# Patient Record
Sex: Male | Born: 1985 | Race: White | Hispanic: No | Marital: Single | Smoking: Current every day smoker
Health system: Southern US, Community
[De-identification: ages and names within clinical notes are randomized; demographics above are authoritative.]

## PROBLEM LIST (undated history)

## (undated) DIAGNOSIS — F32A Depression, unspecified: Secondary | ICD-10-CM

## (undated) DIAGNOSIS — F4310 Post-traumatic stress disorder, unspecified: Secondary | ICD-10-CM

## (undated) DIAGNOSIS — F332 Major depressive disorder, recurrent severe without psychotic features: Secondary | ICD-10-CM

## (undated) DIAGNOSIS — F1910 Other psychoactive substance abuse, uncomplicated: Secondary | ICD-10-CM

## (undated) DIAGNOSIS — F329 Major depressive disorder, single episode, unspecified: Secondary | ICD-10-CM

## (undated) DIAGNOSIS — F419 Anxiety disorder, unspecified: Secondary | ICD-10-CM

## (undated) HISTORY — DX: Major depressive disorder, recurrent severe without psychotic features: F33.2

---

## 2013-10-02 ENCOUNTER — Encounter (HOSPITAL_COMMUNITY): Payer: Self-pay | Admitting: Emergency Medicine

## 2013-10-02 ENCOUNTER — Emergency Department (HOSPITAL_COMMUNITY)
Admission: EM | Admit: 2013-10-02 | Discharge: 2013-10-03 | Disposition: A | Discharge status: Other Acute Inpatient Hospital | Payer: Self-pay | Attending: Emergency Medicine | Admitting: Emergency Medicine

## 2013-10-02 DIAGNOSIS — F1110 Opioid abuse, uncomplicated: Secondary | ICD-10-CM | POA: Insufficient documentation

## 2013-10-02 DIAGNOSIS — F1120 Opioid dependence, uncomplicated: Secondary | ICD-10-CM

## 2013-10-02 DIAGNOSIS — F17200 Nicotine dependence, unspecified, uncomplicated: Secondary | ICD-10-CM | POA: Insufficient documentation

## 2013-10-02 DIAGNOSIS — R45851 Suicidal ideations: Secondary | ICD-10-CM | POA: Insufficient documentation

## 2013-10-02 DIAGNOSIS — Z79899 Other long term (current) drug therapy: Secondary | ICD-10-CM | POA: Insufficient documentation

## 2013-10-02 LAB — COMPREHENSIVE METABOLIC PANEL
ALBUMIN: 4.8 g/dL (ref 3.5–5.2)
ALK PHOS: 65 U/L (ref 39–117)
ALT: 12 U/L (ref 0–53)
AST: 21 U/L (ref 0–37)
Anion gap: 13 (ref 5–15)
BILIRUBIN TOTAL: 0.6 mg/dL (ref 0.3–1.2)
BUN: 12 mg/dL (ref 6–23)
CHLORIDE: 102 meq/L (ref 96–112)
CO2: 26 mEq/L (ref 19–32)
Calcium: 10.2 mg/dL (ref 8.4–10.5)
Creatinine, Ser: 0.98 mg/dL (ref 0.50–1.35)
GFR calc Af Amer: >90 mL/min (ref >90)
GFR calc non Af Amer: >90 mL/min (ref >90)
Glucose, Bld: 90 mg/dL (ref 70–99)
POTASSIUM: 3.8 meq/L (ref 3.7–5.3)
SODIUM: 141 meq/L (ref 137–147)
Total Protein: 7.9 g/dL (ref 6.0–8.3)

## 2013-10-02 LAB — RAPID URINE DRUG SCREEN, HOSP PERFORMED
Amphetamines: NOT DETECTED
BENZODIAZEPINES: NOT DETECTED
Barbiturates: NOT DETECTED
COCAINE: NOT DETECTED
Opiates: NOT DETECTED
TETRAHYDROCANNABINOL: NOT DETECTED

## 2013-10-02 LAB — CBC
HEMATOCRIT: 47.1 % (ref 39.0–52.0)
Hemoglobin: 16.2 g/dL (ref 13.0–17.0)
MCH: 32.2 pg (ref 26.0–34.0)
MCHC: 34.4 g/dL (ref 30.0–36.0)
MCV: 93.6 fL (ref 78.0–100.0)
PLATELETS: 329 10*3/uL (ref 150–400)
RBC: 5.03 MIL/uL (ref 4.22–5.81)
RDW: 11.9 % (ref 11.5–15.5)
WBC: 7.5 10*3/uL (ref 4.0–10.5)

## 2013-10-02 LAB — ETHANOL: Alcohol, Ethyl (B): <11 mg/dL (ref 0–11)

## 2013-10-02 LAB — ACETAMINOPHEN LEVEL

## 2013-10-02 LAB — SALICYLATE LEVEL: Salicylate Lvl: <2 mg/dL — ABNORMAL LOW (ref 2.8–20.0)

## 2013-10-02 MED ORDER — ALUM & MAG HYDROXIDE-SIMETH 200-200-20 MG/5ML PO SUSP: 30.0000 mL | ORAL | Status: DC | PRN

## 2013-10-02 MED ORDER — HYDROXYZINE HCL 25 MG PO TABS
25.0000 mg | ORAL_TABLET | Freq: Four times a day (QID) | ORAL | Status: DC | PRN
  Administered 2013-10-03: 25 mg via ORAL
  Filled 2013-10-02: qty 1

## 2013-10-02 MED ORDER — LORAZEPAM 1 MG PO TABS
1.0000 mg | ORAL_TABLET | Freq: Three times a day (TID) | ORAL | Status: DC | PRN
  Administered 2013-10-02 (×2): 1 mg via ORAL
  Filled 2013-10-02 (×2): qty 1

## 2013-10-02 MED ORDER — DICYCLOMINE HCL 20 MG PO TABS
20.0000 mg | ORAL_TABLET | Freq: Four times a day (QID) | ORAL | Status: DC | PRN
  Administered 2013-10-02: 20 mg via ORAL
  Filled 2013-10-02: qty 1

## 2013-10-02 MED ORDER — NAPROXEN 500 MG PO TABS
500.0000 mg | ORAL_TABLET | Freq: Two times a day (BID) | ORAL | Status: DC | PRN
  Administered 2013-10-03: 500 mg via ORAL
  Filled 2013-10-02: qty 1

## 2013-10-02 MED ORDER — CLONIDINE HCL 0.1 MG PO TABS
0.1000 mg | ORAL_TABLET | Freq: Four times a day (QID) | ORAL | Status: DC
  Administered 2013-10-02 – 2013-10-03 (×2): 0.1 mg via ORAL
  Filled 2013-10-02 (×2): qty 1

## 2013-10-02 MED ORDER — LOPERAMIDE HCL 2 MG PO CAPS: 2.0000 mg | ORAL_CAPSULE | ORAL | Status: DC | PRN

## 2013-10-02 MED ORDER — TRAZODONE HCL 50 MG PO TABS
50.0000 mg | ORAL_TABLET | Freq: Every evening | ORAL | Status: DC | PRN
  Administered 2013-10-02 (×2): 50 mg via ORAL
  Filled 2013-10-02 (×2): qty 1

## 2013-10-02 MED ORDER — CLONIDINE HCL 0.1 MG PO TABS: 0.1000 mg | ORAL_TABLET | Freq: Every day | ORAL | Status: DC

## 2013-10-02 MED ORDER — ONDANSETRON 4 MG PO TBDP: 4.0000 mg | ORAL_TABLET | Freq: Four times a day (QID) | ORAL | Status: DC | PRN

## 2013-10-02 MED ORDER — ZOLPIDEM TARTRATE 5 MG PO TABS: 5.0000 mg | ORAL_TABLET | Freq: Every evening | ORAL | Status: DC | PRN

## 2013-10-02 MED ORDER — NICOTINE 21 MG/24HR TD PT24: 21.0000 mg | MEDICATED_PATCH | Freq: Every day | TRANSDERMAL | Status: DC

## 2013-10-02 MED ORDER — CHLORDIAZEPOXIDE HCL 25 MG PO CAPS
25.0000 mg | ORAL_CAPSULE | Freq: Four times a day (QID) | ORAL | Status: DC | PRN
  Administered 2013-10-02 – 2013-10-03 (×3): 25 mg via ORAL
  Filled 2013-10-02 (×3): qty 1

## 2013-10-02 MED ORDER — METHOCARBAMOL 500 MG PO TABS
500.0000 mg | ORAL_TABLET | Freq: Three times a day (TID) | ORAL | Status: DC | PRN
  Administered 2013-10-03: 500 mg via ORAL
  Filled 2013-10-02: qty 1

## 2013-10-02 MED ORDER — CLONIDINE HCL 0.1 MG PO TABS: 0.1000 mg | ORAL_TABLET | ORAL | Status: DC

## 2013-10-02 MED ORDER — ACETAMINOPHEN 325 MG PO TABS: 650.0000 mg | ORAL_TABLET | ORAL | Status: DC | PRN

## 2013-10-02 MED ORDER — IBUPROFEN 200 MG PO TABS
600.0000 mg | ORAL_TABLET | Freq: Three times a day (TID) | ORAL | Status: DC | PRN
  Administered 2013-10-02: 600 mg via ORAL
  Filled 2013-10-02: qty 3

## 2013-10-02 MED ORDER — ONDANSETRON HCL 4 MG PO TABS: 4.0000 mg | ORAL_TABLET | Freq: Three times a day (TID) | ORAL | Status: DC | PRN

## 2013-10-02 NOTE — ED Notes (Signed)
Patient also complains of abdominal cramps but denies diarrhea. Patient will be medicated per mar. Encouragement and support provided and safety maintain.
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
## 2013-10-02 NOTE — ED Notes (Signed)
Pt here requesting detox from opioids and states he is having thoughts of suicide, no plan but has attempted in the past.

## 2013-10-02 NOTE — ED Notes (Signed)
Pt. Has two black suit cases, a black book bag and a close basket with clothes. He also has one belongings bag. All of patients belongings are at the nurses station.

## 2013-10-02 NOTE — ED Notes (Signed)
Donell SievertSpencer Simon, PA contacted and informed of patient CIWA score of 12. Spencer Pa informed that patient has shakes and tremors and his skin was flushed and red in color. Respirations equal an unlabored, skin moist clammy. New Orders received and will continue to monitor patient.
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
## 2013-10-03 ENCOUNTER — Inpatient Hospital Stay (HOSPITAL_COMMUNITY)
Admission: AD | Admit: 2013-10-03 | Discharge: 2013-10-15 | DRG: 897 | Disposition: A | Discharge status: Home / Self Care | Payer: Federal, State, Local not specified - Other | Source: Intra-hospital | Attending: Psychiatry | Admitting: Psychiatry

## 2013-10-03 ENCOUNTER — Encounter (HOSPITAL_COMMUNITY): Payer: Self-pay | Admitting: *Deleted

## 2013-10-03 DIAGNOSIS — K5900 Constipation, unspecified: Secondary | ICD-10-CM | POA: Diagnosis present

## 2013-10-03 DIAGNOSIS — R45851 Suicidal ideations: Secondary | ICD-10-CM

## 2013-10-03 DIAGNOSIS — F411 Generalized anxiety disorder: Secondary | ICD-10-CM | POA: Diagnosis present

## 2013-10-03 DIAGNOSIS — G4700 Insomnia, unspecified: Secondary | ICD-10-CM | POA: Diagnosis present

## 2013-10-03 DIAGNOSIS — F909 Attention-deficit hyperactivity disorder, unspecified type: Secondary | ICD-10-CM | POA: Diagnosis present

## 2013-10-03 DIAGNOSIS — F4310 Post-traumatic stress disorder, unspecified: Secondary | ICD-10-CM | POA: Diagnosis present

## 2013-10-03 DIAGNOSIS — F410 Panic disorder [episodic paroxysmal anxiety] without agoraphobia: Secondary | ICD-10-CM | POA: Diagnosis present

## 2013-10-03 DIAGNOSIS — F339 Major depressive disorder, recurrent, unspecified: Secondary | ICD-10-CM | POA: Diagnosis present

## 2013-10-03 DIAGNOSIS — F22 Delusional disorders: Secondary | ICD-10-CM | POA: Diagnosis present

## 2013-10-03 DIAGNOSIS — F331 Major depressive disorder, recurrent, moderate: Secondary | ICD-10-CM

## 2013-10-03 DIAGNOSIS — F1193 Opioid use, unspecified with withdrawal: Secondary | ICD-10-CM | POA: Diagnosis present

## 2013-10-03 DIAGNOSIS — F1120 Opioid dependence, uncomplicated: Secondary | ICD-10-CM | POA: Diagnosis present

## 2013-10-03 DIAGNOSIS — F11282 Opioid dependence with opioid-induced sleep disorder: Secondary | ICD-10-CM

## 2013-10-03 MED ORDER — METHOCARBAMOL 500 MG PO TABS
500.0000 mg | ORAL_TABLET | Freq: Three times a day (TID) | ORAL | Status: AC | PRN
  Administered 2013-10-03 – 2013-10-08 (×7): 500 mg via ORAL
  Filled 2013-10-03 (×8): qty 1

## 2013-10-03 MED ORDER — HYDROXYZINE HCL 25 MG PO TABS
25.0000 mg | ORAL_TABLET | Freq: Four times a day (QID) | ORAL | Status: DC | PRN
  Administered 2013-10-03 – 2013-10-05 (×3): 25 mg via ORAL
  Filled 2013-10-03 (×3): qty 1

## 2013-10-03 MED ORDER — ALUM & MAG HYDROXIDE-SIMETH 200-200-20 MG/5ML PO SUSP: 30.0000 mL | ORAL | Status: DC | PRN

## 2013-10-03 MED ORDER — NICOTINE 21 MG/24HR TD PT24
21.0000 mg | MEDICATED_PATCH | Freq: Every day | TRANSDERMAL | Status: DC
  Filled 2013-10-03 (×3): qty 1

## 2013-10-03 MED ORDER — MAGNESIUM HYDROXIDE 400 MG/5ML PO SUSP: 30.0000 mL | Freq: Every day | ORAL | Status: DC | PRN

## 2013-10-03 MED ORDER — TRAZODONE HCL 50 MG PO TABS
50.0000 mg | ORAL_TABLET | Freq: Every evening | ORAL | Status: DC | PRN
  Administered 2013-10-04 – 2013-10-08 (×6): 50 mg via ORAL
  Filled 2013-10-03 (×15): qty 1

## 2013-10-03 MED ORDER — CLONIDINE HCL 0.1 MG PO TABS: 0.1000 mg | ORAL_TABLET | Freq: Every day | ORAL | Status: DC

## 2013-10-03 MED ORDER — NAPROXEN 500 MG PO TABS
500.0000 mg | ORAL_TABLET | Freq: Two times a day (BID) | ORAL | Status: AC | PRN
  Administered 2013-10-05 – 2013-10-08 (×6): 500 mg via ORAL
  Filled 2013-10-03 (×7): qty 1

## 2013-10-03 MED ORDER — ALUM & MAG HYDROXIDE-SIMETH 200-200-20 MG/5ML PO SUSP
30.0000 mL | ORAL | Status: DC | PRN
  Administered 2013-10-12: 30 mL via ORAL

## 2013-10-03 MED ORDER — ACETAMINOPHEN 325 MG PO TABS
650.0000 mg | ORAL_TABLET | Freq: Four times a day (QID) | ORAL | Status: DC | PRN
  Administered 2013-10-08 – 2013-10-10 (×2): 650 mg via ORAL
  Filled 2013-10-03 (×3): qty 2

## 2013-10-03 MED ORDER — DICYCLOMINE HCL 20 MG PO TABS
20.0000 mg | ORAL_TABLET | Freq: Four times a day (QID) | ORAL | Status: AC | PRN
  Administered 2013-10-03 – 2013-10-08 (×5): 20 mg via ORAL
  Filled 2013-10-03 (×6): qty 1

## 2013-10-03 MED ORDER — CLONIDINE HCL 0.1 MG PO TABS
0.1000 mg | ORAL_TABLET | Freq: Four times a day (QID) | ORAL | Status: DC
  Administered 2013-10-03: 0.1 mg via ORAL
  Filled 2013-10-03 (×8): qty 1

## 2013-10-03 MED ORDER — ONDANSETRON 4 MG PO TBDP
4.0000 mg | ORAL_TABLET | Freq: Four times a day (QID) | ORAL | Status: AC | PRN
  Filled 2013-10-03: qty 1

## 2013-10-03 MED ORDER — CLONIDINE HCL 0.1 MG PO TABS: 0.1000 mg | ORAL_TABLET | ORAL | Status: DC

## 2013-10-03 MED ORDER — LOPERAMIDE HCL 2 MG PO CAPS: 2.0000 mg | ORAL_CAPSULE | ORAL | Status: AC | PRN

## 2013-10-03 NOTE — ED Notes (Signed)
Patient complain of muscle cramps and shakes. Patient will be medicated per MAR. No acute distress noted.

## 2013-10-03 NOTE — Progress Notes (Signed)
Patient's diastolic pressure and pulse was very low. Writer gave 2 cups of Gatorade and recheck the v/s but it remained low. Writer notified PA OakwoodSpencer. He encouraged staff to force fluid and hold all medications. Writer encouraged patient and offered more fluid and snacks. Patient receptive to encouragement and support.

## 2013-10-03 NOTE — Plan of Care (Signed)
[facility] Observation Crisis Plan  Reason for Crisis Plan:  Substance Abuse   Plan of Care:  Referral for Substance Abuse  Family Support:      Current Living Environment:  Living Arrangements: Alone  Insurance:   Hospital Account   Name Acct ID Class Status Primary Coverage   Mason Jones, Mason Jones [id number] BEHAVIORAL HEALTH OBSERVATION Open None        Guarantor Account (for Hospital Account [id number])   Name Relation to Pt Service Area Active? Acct Type   Mason Jones, Mason Jones Self [facility] Yes Behavioral Health   Address Phone       [address] [place], [place] [address] [phone](H)          Coverage Information (for Hospital Account [id number])   Not on file      Legal Guardian:     Primary Care Provider:  No primary provider on file.  Current Outpatient Providers:  [facility]  Psychiatrist:     Counselor/Therapist:     Compliant with Medications:  No  Additional Information:   Mason Jones, Mason Jones 7/8/20153:46 PM

## 2013-10-03 NOTE — Progress Notes (Signed)
Patient in bed sleeping for the most part of the evening. Writer woke patient up and offered him Gatorade and snack. Patient endorsed having tremors, sweats and body aches. Writer encouraged and supported patient. Patient receptive to encouragement and support. Q 15 minute check continues as ordered to maintain safety.

## 2013-10-03 NOTE — Progress Notes (Addendum)
Patient ID: Mason HeirJames Jones, male   DOB: 11/06/1985, 28 y.o.   MRN: [id number] Patient admitted to obs unit.  Irritable, depressed . Affect sullen. Poor eye contact. COWS 10 .Oriented to unit. Nutrition offered. Education provided regarding safety and falls. Safety checks started every 15 minutes.

## 2013-10-03 NOTE — ED Provider Notes (Signed)
Medical screening examination/treatment/procedure(s) were performed by non-physician practitioner and as supervising physician I was immediately available for consultation/collaboration.   EKG Interpretation None        Richardean Canalavid H Amira Podolak, MD 10/03/13 2123

## 2013-10-03 NOTE — Consult Note (Signed)
  Mr Mason Jones is requesting detox from opiates even though it has been 4 days since his last use.he is saying he is frustrated and suicidal.  He was in a methadone clinic but was still using so he was discharged.  [facility] but does not want to follow there. [facility] also [facility] but apparently left there because he did not like it.  [place] then [place] this [place] homeless and seeking long term rehab.  He has some suicidal thoughts saying "it's a never ending cycle of fuck- ups"  [facility]

## 2013-10-03 NOTE — Progress Notes (Signed)
[facility] INPATIENT:  Family/Significant Other Suicide Prevention Education  Suicide Prevention Education:  Patient Refusal for Family/Significant Other Suicide Prevention Education: The patient Mason Jones has refused to provide written consent for family/significant other to be provided Family/Significant Other Suicide Prevention Education during admission and/or prior to discharge.  Physician notified.  Loren RacerMaggio, [address] 10/03/2013, 3:49 PM

## 2013-10-04 DIAGNOSIS — F4310 Post-traumatic stress disorder, unspecified: Secondary | ICD-10-CM | POA: Diagnosis present

## 2013-10-04 DIAGNOSIS — F1120 Opioid dependence, uncomplicated: Secondary | ICD-10-CM | POA: Diagnosis present

## 2013-10-04 DIAGNOSIS — F410 Panic disorder [episodic paroxysmal anxiety] without agoraphobia: Secondary | ICD-10-CM | POA: Diagnosis present

## 2013-10-04 DIAGNOSIS — F22 Delusional disorders: Secondary | ICD-10-CM | POA: Diagnosis present

## 2013-10-04 DIAGNOSIS — F909 Attention-deficit hyperactivity disorder, unspecified type: Secondary | ICD-10-CM | POA: Diagnosis present

## 2013-10-04 DIAGNOSIS — K5900 Constipation, unspecified: Secondary | ICD-10-CM | POA: Diagnosis present

## 2013-10-04 DIAGNOSIS — G4700 Insomnia, unspecified: Secondary | ICD-10-CM | POA: Diagnosis present

## 2013-10-04 DIAGNOSIS — R45851 Suicidal ideations: Secondary | ICD-10-CM | POA: Diagnosis not present

## 2013-10-04 DIAGNOSIS — F411 Generalized anxiety disorder: Secondary | ICD-10-CM | POA: Diagnosis present

## 2013-10-04 DIAGNOSIS — F339 Major depressive disorder, recurrent, unspecified: Secondary | ICD-10-CM | POA: Diagnosis present

## 2013-10-04 MED ORDER — METHOCARBAMOL 500 MG PO TABS: 500.0000 mg | ORAL_TABLET | Freq: Three times a day (TID) | ORAL | Status: DC | PRN

## 2013-10-04 MED ORDER — CLONIDINE HCL 0.1 MG PO TABS
0.1000 mg | ORAL_TABLET | Freq: Four times a day (QID) | ORAL | Status: DC
Start: 2013-10-04 — End: 2013-10-04
  Filled 2013-10-04 (×3): qty 1

## 2013-10-04 MED ORDER — NICOTINE 21 MG/24HR TD PT24
21.0000 mg | MEDICATED_PATCH | Freq: Every day | TRANSDERMAL | Status: DC
  Administered 2013-10-04: 21 mg via TRANSDERMAL
  Filled 2013-10-04 (×3): qty 1

## 2013-10-04 MED ORDER — CLONIDINE HCL 0.1 MG PO TABS
0.1000 mg | ORAL_TABLET | ORAL | Status: AC
  Administered 2013-10-06 – 2013-10-08 (×4): 0.1 mg via ORAL
  Filled 2013-10-04 (×4): qty 1

## 2013-10-04 MED ORDER — CLONIDINE HCL 0.1 MG PO TABS: 0.1000 mg | ORAL_TABLET | ORAL | Status: DC

## 2013-10-04 MED ORDER — LOPERAMIDE HCL 2 MG PO CAPS: 2.0000 mg | ORAL_CAPSULE | ORAL | Status: DC | PRN

## 2013-10-04 MED ORDER — CLONIDINE HCL 0.1 MG PO TABS
0.1000 mg | ORAL_TABLET | Freq: Four times a day (QID) | ORAL | Status: AC
  Administered 2013-10-04 – 2013-10-06 (×7): 0.1 mg via ORAL
  Filled 2013-10-04 (×9): qty 1

## 2013-10-04 MED ORDER — CLONIDINE HCL 0.1 MG PO TABS: 0.1000 mg | ORAL_TABLET | Freq: Every day | ORAL | Status: DC

## 2013-10-04 MED ORDER — HYDROXYZINE HCL 25 MG PO TABS
25.0000 mg | ORAL_TABLET | Freq: Four times a day (QID) | ORAL | Status: DC | PRN
  Administered 2013-10-04: 25 mg via ORAL
  Filled 2013-10-04: qty 1

## 2013-10-04 MED ORDER — ONDANSETRON 4 MG PO TBDP
4.0000 mg | ORAL_TABLET | Freq: Four times a day (QID) | ORAL | Status: DC | PRN
  Administered 2013-10-04: 4 mg via ORAL

## 2013-10-04 MED ORDER — NAPROXEN 500 MG PO TABS
500.0000 mg | ORAL_TABLET | Freq: Two times a day (BID) | ORAL | Status: DC | PRN
  Administered 2013-10-04: 500 mg via ORAL

## 2013-10-04 MED ORDER — LORAZEPAM 1 MG PO TABS
1.0000 mg | ORAL_TABLET | Freq: Four times a day (QID) | ORAL | Status: DC | PRN
  Administered 2013-10-04 – 2013-10-10 (×17): 1 mg via ORAL
  Filled 2013-10-04 (×18): qty 1

## 2013-10-04 MED ORDER — TRAZODONE HCL 50 MG PO TABS: 50.0000 mg | ORAL_TABLET | Freq: Every evening | ORAL | Status: DC | PRN

## 2013-10-04 MED ORDER — CLONIDINE HCL 0.1 MG PO TABS
0.1000 mg | ORAL_TABLET | Freq: Every day | ORAL | Status: AC
  Administered 2013-10-09 – 2013-10-10 (×2): 0.1 mg via ORAL
  Filled 2013-10-04 (×2): qty 1

## 2013-10-04 MED ORDER — METHOCARBAMOL 500 MG PO TABS
500.0000 mg | ORAL_TABLET | Freq: Three times a day (TID) | ORAL | Status: DC | PRN
  Administered 2013-10-04: 500 mg via ORAL

## 2013-10-04 MED ORDER — DICYCLOMINE HCL 20 MG PO TABS
20.0000 mg | ORAL_TABLET | Freq: Four times a day (QID) | ORAL | Status: DC | PRN
  Administered 2013-10-04: 20 mg via ORAL

## 2013-10-04 NOTE — Progress Notes (Signed)
Pt transferred from obs unit to adult unit, pt requesting detox from opiates. Pt spoke about wanting to go to long-term treatment facility after discharge. Pt denies SI at present time, pt was oriented to the unit and safety maintained.

## 2013-10-04 NOTE — Progress Notes (Addendum)
Patient ID: Mason Jones, male   DOB: 04/13/1985, 28 y.o.   MRN: [id number] Patient sleeping all am. Clondine with held due to low diastolic BP this AM. NP notified. Continue to monitor for safety.

## 2013-10-04 NOTE — Tx Team (Signed)
Initial Interdisciplinary Treatment Plan  PATIENT STRENGTHS: (choose at least two) Ability for insight Average or above average intelligence General fund of knowledge Motivation for treatment/growth  PATIENT STRESSORS: Substance abuse   PROBLEM LIST: Problem List/Patient Goals Date to be addressed Date deferred Reason deferred Estimated date of resolution  Opiate abuse 10/04/13                                                      DISCHARGE CRITERIA:  Ability to meet basic life and health needs Improved stabilization in mood, thinking, and/or behavior Verbal commitment to aftercare and medication compliance Withdrawal symptoms are absent or subacute and managed without 24-hour nursing intervention  PRELIMINARY DISCHARGE PLAN: Attend aftercare/continuing care group Placement in alternative living arrangements  PATIENT/FAMIILY INVOLVEMENT: This treatment plan has been presented to and reviewed with the patient, Mason Jones, and/or family member, .  The patient and family have been given the opportunity to ask questions and make suggestions.  Charma Mocarski, [place] 10/04/2013, 7:54 PM

## 2013-10-04 NOTE — Plan of Care (Addendum)
[facility] Observation Crisis Plan  Reason for Crisis Plan:  Crisis Stabilization   Plan of Care:  Referral for Inpatient Hospitalization  Family Support:    None  Current Living Environment:  Living Arrangements: Alone; Pt has been living in a halfway house.  He must go through treatment before he is allowed to return.  Insurance:  Self Pay Hospital Account   Name Acct ID Class Status Primary Coverage   Urbano HeirRice, Tyshan [id number] BEHAVIORAL HEALTH OBSERVATION Open None        Guarantor Account (for Hospital Account [id number])   Name Relation to Pt Service Area Active? Acct Type   Cephas Darbyice, Colman Self [facility] Yes Behavioral Health   Address Phone       [address] [place], [place] [address] [phone](H)          Coverage Information (for Hospital Account [id number])   Not on file      Legal Guardian:   Self  Primary Care Provider:  No primary provider on file. None  Current Outpatient Providers:  None  Psychiatrist:   None  Counselor/Therapist:   None  Compliant with Medications:  Yes  Additional Information: After consulting with Delorise Royalsorey Burkett, NP it has been determined that pt would benefit from psychiatric hospitalization.  He is to be admitted to the 300 hall to the service of Geoffery LyonsIrving Lugo, MD.  Per Berneice Heinrichina Tate, RN, [facility], no bed is available currently, but openings are anticipated.  Pt signed Voluntary Admission and Consent for Treatment.  He also signed Consent for Release of Information, but has no current providers.  Doylene Canninghomas Cherri Yera, MA Triage Specialist Kizzie BaneHughes, Harriette Bouillonhomas Patrick 7/9/201511:45 AM

## 2013-10-04 NOTE — Progress Notes (Signed)
[facility] MD Progress Note  10/04/2013 2:27 PM Mason Jones  MRN:  [id number] Subjective:  28 year old male who presented to [facility] for SI and requesting detox from opioids.  He has used opioids for several years and according to patient he has underwent twice.  Last use of Opioids- Roxicodone was 5 days ago.  He endorses SI upon assessment with Bernarda Caffey, PA-C but reported at that time that he did not have a plan.  He reports to Probation officer during assessment that he does have multiple plans.  Plans suicide by shooting himself with a gun, jumping off of a bridge, or running in front of a car.  He denies HI or AVH.  He states that he is just tired if life,  He continues, "I have lost everything to come here".  "I was trying to do good and I lost my job and my apartment".  Tremors noted during assessment, he is also agitated and anxious during assessment.    Diagnosis: suicidal ideation; opioid dependence Total Time spent with patient: 15 minutes  ADL's:  Intact  Sleep: Fair  Appetite:  Fair  Suicidal Ideation:  Plan:  to shoot himself, to jump off of a bridge, or running in front of a car Homicidal Ideation:  denies  Psychiatric Specialty Exam: Physical Exam  ROS  Blood pressure 110/78, pulse 66, temperature 98.3 F (36.8 C), temperature source Oral, resp. rate 16, height 5' 10"  (1.778 m), weight 62.37 kg (137 lb 8 oz), SpO2 100.00%.Body mass index is 19.73 kg/(m^2).  General Appearance: Fairly Groomed  Engineer, water::  Fair  Speech:  Blocked and Normal Rate  Volume:  Normal  Mood:  Anxious  Affect:  Flat  Thought Process:  Coherent  Orientation:  Full (Time, Place, and Person)  Thought Content:  Negative  Suicidal Thoughts:  Yes.  with intent/plan  Homicidal Thoughts:  No  Memory:  Immediate;   Fair Recent;   Fair  Judgement:  Impaired  Insight:  Lacking  Psychomotor Activity:  Negative  Concentration:  Fair  Recall:  [employer] of Knowledge:Fair  Language: Fair  Akathisia:  Negative   Handed:    AIMS (if indicated):     Assets:  Communication Skills Desire for Improvement  Sleep:       Current Medications: Current Facility-Administered Medications  Medication Dose Route Frequency Provider Last Rate Last Dose  . acetaminophen (TYLENOL) tablet 650 mg  650 mg Oral Q6H PRN Clarene Reamer, MD      . alum & mag hydroxide-simeth (MAALOX/MYLANTA) 200-200-20 MG/5ML suspension 30 mL  30 mL Oral PRN Clarene Reamer, MD      . alum & mag hydroxide-simeth (MAALOX/MYLANTA) 200-200-20 MG/5ML suspension 30 mL  30 mL Oral Q4H PRN Clarene Reamer, MD      . cloNIDine (CATAPRES) tablet 0.1 mg  0.1 mg Oral QID Clarene Reamer, MD   0.1 mg at 10/03/13 1733   Followed by  . [START ON 10/06/2013] cloNIDine (CATAPRES) tablet 0.1 mg  0.1 mg Oral BH-qamhs Clarene Reamer, MD       Followed by  . [START ON 10/08/2013] cloNIDine (CATAPRES) tablet 0.1 mg  0.1 mg Oral QAC breakfast Clarene Reamer, MD      . dicyclomine (BENTYL) tablet 20 mg  20 mg Oral Q6H PRN Clarene Reamer, MD   20 mg at 10/03/13 1734  . hydrOXYzine (ATARAX/VISTARIL) tablet 25 mg  25 mg Oral Q6H PRN Clarene Reamer, MD  25 mg at 10/03/13 2229  . loperamide (IMODIUM) capsule 2-4 mg  2-4 mg Oral PRN Clarene Reamer, MD      . magnesium hydroxide (MILK OF MAGNESIA) suspension 30 mL  30 mL Oral Daily PRN Clarene Reamer, MD      . methocarbamol (ROBAXIN) tablet 500 mg  500 mg Oral Q8H PRN Clarene Reamer, MD   500 mg at 10/03/13 1733  . naproxen (NAPROSYN) tablet 500 mg  500 mg Oral BID PRN Clarene Reamer, MD      . nicotine (NICODERM CQ - dosed in mg/24 hours) patch 21 mg  21 mg Transdermal Daily Clarene Reamer, MD      . ondansetron (ZOFRAN-ODT) disintegrating tablet 4 mg  4 mg Oral Q6H PRN Clarene Reamer, MD      . traZODone (DESYREL) tablet 50 mg  50 mg Oral QHS,MR X 1 Clarene Reamer, MD        Lab Results:  Results for orders placed during the hospital encounter of 10/02/13 (from the past 48 hour(s))  URINE RAPID DRUG  SCREEN (HOSP PERFORMED)     Status: None   Collection Time    10/02/13  2:34 PM      Result Value Ref Range   Opiates NONE DETECTED  NONE DETECTED   Cocaine NONE DETECTED  NONE DETECTED   Benzodiazepines NONE DETECTED  NONE DETECTED   Amphetamines NONE DETECTED  NONE DETECTED   Tetrahydrocannabinol NONE DETECTED  NONE DETECTED   Barbiturates NONE DETECTED  NONE DETECTED   Comment:            DRUG SCREEN FOR MEDICAL PURPOSES     ONLY.  IF CONFIRMATION IS NEEDED     FOR ANY PURPOSE, NOTIFY LAB     WITHIN 5 DAYS.                LOWEST DETECTABLE LIMITS     FOR URINE DRUG SCREEN     Drug Class       Cutoff (ng/mL)     Amphetamine      1000     Barbiturate      200     Benzodiazepine   768     Tricyclics       088     Opiates          300     Cocaine          300     THC              50  ACETAMINOPHEN LEVEL     Status: None   Collection Time    10/02/13  2:52 PM      Result Value Ref Range   Acetaminophen (Tylenol), Serum <15.0  10 - 30 ug/mL   Comment:            THERAPEUTIC CONCENTRATIONS VARY     SIGNIFICANTLY. A RANGE OF 10-30     ug/mL MAY BE AN EFFECTIVE     CONCENTRATION FOR MANY PATIENTS.     HOWEVER, SOME ARE BEST TREATED     AT CONCENTRATIONS OUTSIDE THIS     RANGE.     ACETAMINOPHEN CONCENTRATIONS     >150 ug/mL AT 4 HOURS AFTER     INGESTION AND >50 ug/mL AT 12     HOURS AFTER INGESTION ARE     OFTEN ASSOCIATED WITH TOXIC     REACTIONS.  CBC  Status: None   Collection Time    10/02/13  2:52 PM      Result Value Ref Range   WBC 7.5  4.0 - 10.5 K/uL   RBC 5.03  4.22 - 5.81 MIL/uL   Hemoglobin 16.2  13.0 - 17.0 g/dL   HCT 47.1  39.0 - 52.0 %   MCV 93.6  78.0 - 100.0 fL   MCH 32.2  26.0 - 34.0 pg   MCHC 34.4  30.0 - 36.0 g/dL   RDW 11.9  11.5 - 15.5 %   Platelets 329  150 - 400 K/uL  COMPREHENSIVE METABOLIC PANEL     Status: None   Collection Time    10/02/13  2:52 PM      Result Value Ref Range   Sodium 141  137 - 147 mEq/L   Potassium 3.8  3.7 -  5.3 mEq/L   Chloride 102  96 - 112 mEq/L   CO2 26  19 - 32 mEq/L   Glucose, Bld 90  70 - 99 mg/dL   BUN 12  6 - 23 mg/dL   Creatinine, Ser 0.98  0.50 - 1.35 mg/dL   Calcium 10.2  8.4 - 10.5 mg/dL   Total Protein 7.9  6.0 - 8.3 g/dL   Albumin 4.8  3.5 - 5.2 g/dL   AST 21  0 - 37 U/L   ALT 12  0 - 53 U/L   Alkaline Phosphatase 65  39 - 117 U/L   Total Bilirubin 0.6  0.3 - 1.2 mg/dL   GFR calc non Af Amer >90  >90 mL/min   GFR calc Af Amer >90  >90 mL/min   Comment: (NOTE)     The eGFR has been calculated using the CKD EPI equation.     This calculation has not been validated in all clinical situations.     eGFR's persistently <90 mL/min signify possible Chronic Kidney     Disease.   Anion gap 13  5 - 15  ETHANOL     Status: None   Collection Time    10/02/13  2:52 PM      Result Value Ref Range   Alcohol, Ethyl (B) <11  0 - 11 mg/dL   Comment:            LOWEST DETECTABLE LIMIT FOR     SERUM ALCOHOL IS 11 mg/dL     FOR MEDICAL PURPOSES ONLY  SALICYLATE LEVEL     Status: Abnormal   Collection Time    10/02/13  2:52 PM      Result Value Ref Range   Salicylate Lvl <0.3 (*) 2.8 - 20.0 mg/dL    Physical Findings: AIMS:  , ,  ,  ,    CIWA:    COWS:  COWS Total Score: 10  Treatment Plan Summary: Daily contact with patient to assess and evaluate symptoms and progress in treatment Medication management  Plan: 1. Admit inpatient to 300 hall.  2. Crisis management and stabilization.  2. Medication management:  4.. Encourage patient to attend groups and participate in group counseling sessions and activities.  6. Address health issues: Vitals reviewed and stable; order for [facility] pm draw for CMP and CBC.   I certify that inpatient services furnished can reasonably be expected to improve the patient's condition.   Gypsy Lore CORI 10/04/2013, 2:27 PM

## 2013-10-04 NOTE — Progress Notes (Signed)
Patient ID: Mason Jones, male   DOB: 07/02/1985, 28 y.o.   MRN: [id number] Patient refused 12 pm dose of Clonidine stating "It doesn't help, I want Ativan". Blood pressure improved to 110/78. Patient to be admitted to Adult 300 hall when bed is available.

## 2013-10-04 NOTE — Progress Notes (Signed)
Patient ID: Mason Jones, male   DOB: 02/29/1986, 28 y.o.   MRN: [id number] Patient transferred to Adult 300.  Taken to unit by Shon BatonBrooks, Charity fundraiserN.

## 2013-10-04 NOTE — Progress Notes (Signed)
Patient did not attend the evening karaoke group. Pt did walk down to the cafeteria but returned shortly after asking for his nurse. Pt requested medication and had questions about his medications.

## 2013-10-05 MED ORDER — VENLAFAXINE HCL ER 37.5 MG PO CP24
37.5000 mg | ORAL_CAPSULE | Freq: Every day | ORAL | Status: DC
  Administered 2013-10-06 – 2013-10-08 (×3): 37.5 mg via ORAL
  Filled 2013-10-05 (×4): qty 1

## 2013-10-05 MED ORDER — LORAZEPAM 1 MG PO TABS
1.0000 mg | ORAL_TABLET | Freq: Once | ORAL | Status: AC
  Administered 2013-10-05: 1 mg via ORAL

## 2013-10-05 MED ORDER — ATOMOXETINE HCL 40 MG PO CAPS
80.0000 mg | ORAL_CAPSULE | Freq: Every day | ORAL | Status: DC
  Administered 2013-10-05: 80 mg via ORAL
  Filled 2013-10-05 (×4): qty 2

## 2013-10-05 MED ORDER — NICOTINE POLACRILEX 2 MG MT GUM
CHEWING_GUM | OROMUCOSAL | Status: AC
  Administered 2013-10-05: 2 mg
  Filled 2013-10-05: qty 1

## 2013-10-05 MED ORDER — QUETIAPINE FUMARATE 50 MG PO TABS
50.0000 mg | ORAL_TABLET | Freq: Three times a day (TID) | ORAL | Status: DC
  Administered 2013-10-05: 50 mg via ORAL
  Filled 2013-10-05 (×7): qty 1

## 2013-10-05 MED ORDER — OLANZAPINE 5 MG PO TABS
5.0000 mg | ORAL_TABLET | Freq: Every day | ORAL | Status: DC
  Administered 2013-10-05 – 2013-10-10 (×6): 5 mg via ORAL
  Filled 2013-10-05 (×6): qty 1
  Filled 2013-10-05: qty 2
  Filled 2013-10-05: qty 1

## 2013-10-05 MED ORDER — LORAZEPAM 1 MG PO TABS
ORAL_TABLET | ORAL | Status: AC
  Filled 2013-10-05: qty 1

## 2013-10-05 MED ORDER — QUETIAPINE FUMARATE 100 MG PO TABS
100.0000 mg | ORAL_TABLET | Freq: Every day | ORAL | Status: DC
  Filled 2013-10-05 (×2): qty 1

## 2013-10-05 MED ORDER — ENSURE COMPLETE PO LIQD
237.0000 mL | Freq: Three times a day (TID) | ORAL | Status: DC
  Administered 2013-10-05 – 2013-10-15 (×27): 237 mL via ORAL

## 2013-10-05 MED ORDER — NICOTINE POLACRILEX 2 MG MT GUM
2.0000 mg | CHEWING_GUM | OROMUCOSAL | Status: DC | PRN
  Administered 2013-10-05: 2 mg via ORAL
  Filled 2013-10-05 (×2): qty 1

## 2013-10-05 MED ORDER — FLUOXETINE HCL 20 MG PO CAPS
60.0000 mg | ORAL_CAPSULE | Freq: Every day | ORAL | Status: DC
  Administered 2013-10-05: 60 mg via ORAL
  Filled 2013-10-05 (×4): qty 3

## 2013-10-05 NOTE — [facility] Group Notes (Signed)
Adult Psychoeducational Group Note  Date:  10/05/2013 Time:  10:43 PM  Group Topic/Focus:  AA Meeting  Participation Level:  Active  Participation Quality:  Appropriate and Attentive  Affect:  Flat  Cognitive:  Appropriate  Insight: Good  Engagement in Group:  Engaged  Modes of Intervention:  Discussion and Education  Additional Comments:  Mason Jones was active in telling the group about his struggles to get clean and his personal view of addiction.  Mason Jones, Mason Jones 10/05/2013, 10:43 PM

## 2013-10-05 NOTE — Tx Team (Signed)
Interdisciplinary Treatment Plan Update (Adult)  Date: 10/05/2013   Time Reviewed: 10:51 AM  Progress in Treatment:  Attending groups: No.  Participating in groups:  No.  Taking medication as prescribed: Yes  Tolerating medication: Yes  Family/Significant othe contact made: Not yet. SPE required for pt.   Patient understands diagnosis: Yes, AEB seeking treatment for opioid detox, SI, mood stabilization, and med management.  Discussing patient identified problems/goals with staff: Yes  Medical problems stabilized or resolved: Yes  Denies suicidal/homicidal ideation: Yes, self report.  Patient has not harmed self or Others: Yes  New problem(s) identified:  Discharge Plan or Barriers: Pt not attending d/c planning at this time. CSW assessing for appropriate referrals.  Additional comments: 28 year old male [facility] and requesting detox from opioids. He has used opioids for several years and according to patient he has underwent twice. Last use of Opioids- Roxicodone was 5 days ago. He endorses SI upon assessment with Ileene HutchinsonLisa Saunders, PA-C but reported at that time that he did not have a plan. He reports to Clinical research associatewriter during assessment that he does have multiple plans. Plans suicide by shooting himself with a gun, jumping off of a bridge, or running in front of a car. He denies HI or AVH. He states that he is just tired if life, He continues, "I have lost everything to come here". "I was trying to do good and I lost my job and my apartment". Tremors noted during assessment, he is also agitated and anxious during assessment.  Reason for Continuation of Hospitalization: Clonidine taper-withdrawals Mood stabilization Med management  Estimated length of stay: 3-5 days  For review of initial/current patient goals, please see plan of care.  Attendees:  Patient:    Family:    Physician: Dr. Jama Flavorsobos MD 10/05/2013 10:49 AM   Nursing: Meryl DareJennifer P. RN 10/05/2013 10:49 AM   Clinical Social Worker  Bryler Dibble Smart, LCSWA  10/05/2013 10:49 AM   Other: Sue LushAndrea RN 10/05/2013 10:49 AM   Other: Chandra BatchAggie N. PA 10/05/2013 10:50 AM   Other: Massie Kluverelores Sutton, Community Care Coordinator  10/05/2013 10:50 AM   Other:    Scribe for Treatment Team:  The Sherwin-WilliamsHeather Smart LCSWA 10/05/2013 10:51 AM
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
## 2013-10-05 NOTE — [facility] Group Notes (Signed)
[facility] LCSW Group Therapy  10/05/2013 9:43 AM  Type of Therapy:  Group Therapy  Participation Level:  Did Not Attend-sleeping in room   Smart, Lebron QuamHeather LCSWA  10/05/2013, 9:43 AM

## 2013-10-05 NOTE — Progress Notes (Signed)
NUTRITION ASSESSMENT  Pt identified as at risk on the Malnutrition Screen Tool  INTERVENTION: 1. Educated patient on the importance of nutrition and encouraged intake of food and beverages. 2. Discussed weight goals. 3. Supplements: Ensure Complete po TID, each supplement provides 350 kcal and 13 grams of protein   NUTRITION DIAGNOSIS: Unintentional weight loss related to sub-optimal intake as evidenced by pt report.   Goal: Pt to meet >/= 90% of their estimated nutrition needs.  Monitor:  PO intake  Assessment:  Patient admitted with opiod dependence and SI.   States that he was not eating healthfully prior to admit. UBW 153 lbs 1 1/2 months ago with a 15 lb weight loss during that time secondary to drugs and depression. Poor appetite and intake currently.    28 y.o. male  Height: Ht Readings from Last 1 Encounters:  10/03/13 5\' 10"  (1.778 m)    Weight: Wt Readings from Last 1 Encounters:  10/03/13 137 lb 8 oz (62.37 kg)    Weight Hx: Wt Readings from Last 10 Encounters:  10/03/13 137 lb 8 oz (62.37 kg)    BMI:  Body mass index is 19.73 kg/(m^2). Pt meets criteria for normal based on current BMI.  Estimated Nutritional Needs: Kcal: 25-30 kcal/kg Protein: > 1 gram protein/kg Fluid: 1 ml/kcal  Diet Order: General Pt is also offered choice of unit snacks mid-morning and mid-afternoon.  Pt is eating as desired.   Lab results and medications reviewed.   Oran ReinLaura Jobe, RD, LDN Clinical Inpatient Dietitian Pager:  [phone] Weekend and after hours pager:  [phone]

## 2013-10-05 NOTE — Clinical Social Work Note (Signed)
CSW attempted to meet with pt to complete PSA. Pt sleeping in room majority of day/sleeping this afternoon. Unable to meet with pt.   The Sherwin-WilliamsHeather Smart, LCSWA 10/05/2013 2:49 PM
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
## 2013-10-05 NOTE — Progress Notes (Signed)
Patient ID: Mason Jones, male   DOB: 07/08/1985, 28 y.o.   MRN: [id number] D: pt. Visible on unit in med room interacting with other clients. Pt. Went down to [employer] Motor Companykaraoke, came back within 20 minutes complaining of anxiety, tremors. A: Writer introduced self to client,pt. Assessed and NP called, no new orders received, reviewed medications, encouraged pt. To take medications that were available.  Pt. Given standing PRN vistaril order, told other PRN medications available at 2330. R: pt. Took medication, agrees to notify nurse of any other complaints.

## 2013-10-05 NOTE — [facility] Suicide Risk Assessment (Signed)
   Nursing information obtained from:    Demographic factors:    28 year old man, single, no kids, unemployed currently  Current Mental Status:   See below Loss Factors:    Historical Factors:   Reports long history of opiate dependence, also describes alcohol abuse Risk Reduction Factors:    Total Time spent with patient: 45 minutes  CLINICAL FACTORS:   Depression:   Anhedonia Impulsivity  Psychiatric Specialty Exam: Physical Exam  ROS  Blood pressure 101/40, pulse 78, temperature 97.8 F (36.6 C), temperature source Oral, resp. rate 18, height 5\' 10"  (1.778 m), weight 62.37 kg (137 lb 8 oz), SpO2 100.00%.Body mass index is 19.73 kg/(m^2).  General Appearance: Fairly Groomed  Patent attorneyye Contact::  Fair  Speech:  Slow  Volume:  Decreased  Mood:  Anxious and Depressed  Affect:  Congruent and Depressed  Thought Process:  Linear  Orientation:  NA- fully alert and attentive   Thought Content:  describes " voices in head". Describes these as hearing his own voice inside his head telling him to jkeep on using  drugs as there is no reason to be sober. Does not appear internally preoccupied at this time. No delusions expressed.  Suicidal Thoughts:  No- denies any current suicidal or homicidal ideations and is able to contract for safety  Homicidal Thoughts:  No  Memory:  NA  Judgement:  Fair  Insight:  Fair  Psychomotor Activity:  slightly restless  Concentration:  Fair  Recall:  Fis[employer]Fair  Fund of Knowledge:Good  Language: Fair  Akathisia:  Negative  Handed:  Left  AIMS (if indicated):     Assets:  Desire for Improvement Resilience  Sleep:  Number of Hours: 4.25   Musculoskeletal: Strength & Muscle Tone: within normal limits  Describes opiate WDL symptoms , to include nausea, aches, cramps.  Gait & Station: normal Patient leans: N/A  COGNITIVE FEATURES THAT CONTRIBUTE TO RISK:  Closed-mindedness    SUICIDE RISK:   Moderate:  Frequent suicidal ideation with limited intensity, and  duration, some specificity in terms of plans, no associated intent, good self-control, limited dysphoria/symptomatology, some risk factors present, and identifiable protective factors, including available and accessible social support.  PLAN OF CARE:Patient will be admitted to inpatient psychiatric unit for stabilization and safety. Will provide and encourage milieu participation. Provide medication management and maked adjustments as needed.  Will provide detoxification protocol to minimize risk of significant WDL. Will follow daily.    I certify that inpatient services furnished can reasonably be expected to improve the patient's condition.  Ramone Gander 10/05/2013, 3:06 PM

## 2013-10-05 NOTE — [facility] Group Notes (Signed)
[facility] LCSW Group Therapy  10/05/2013 2:49 PM  Type of Therapy:  Group Therapy  Participation Level:  Did Not Attend-pt in bed/irritable/refusing group.   Smart, Shritha Bresee LCSWA  10/05/2013, 2:49 PM

## 2013-10-05 NOTE — Progress Notes (Signed)
Patient ID: Mason Jones, Mason Jones   DOB: 08/15/1985, 28 y.o.   MRN: [id number]   D: Patient has been irritable and agitated most of the am. Reports that he feels that the medications that we are giving him are not working. Patient asking about methadone and Subutex. Told patient that we do not detox with methadone here and patient's are not given Subutex often here either. He says he wants to transfer to somewhere that will give him something that works. Patient told about the 72hr request for discharge process but did not ask to sign at this time. He will speak with NP and psychiatrist today. Did finally take clonidine with some encouragement and told about his prn options for any symptoms. Patient maintaining behavior at present but appears to anger easily. A: Staff will monitor on q 15 minute checks, follow treatment plan, and give meds as ordered. R: Will monitor and redirect as needed.

## 2013-10-05 NOTE — Progress Notes (Signed)
D.  Pt anxious and irritable on approach, suffering from withdrawal symptoms.  Pt also requested medication for his ADHD.  Positive for evening AA group, interacting better this evening on unit.  Threw table earlier, apologized this shift.  Denies SI/HI/hallucinations at this time.  States that issue is that he needs his medication to be right.  A.  Support and encouragement offered, medication given as ordered this far.  R.  Pt remains safe on unit, will continue to monitor.

## 2013-10-05 NOTE — Progress Notes (Signed)
Patient ID: Urbano HeirJames Zajkowski, male   DOB: 11/16/1985, 28 y.o.   MRN: [id number] D: Pt. Lying in bed calm, tremor decreased (see CIWA), no complaints at this time. A: Writer observed for signs of distress. R: Pt. Eyes closed, appears less tense.

## 2013-10-06 DIAGNOSIS — R45851 Suicidal ideations: Secondary | ICD-10-CM

## 2013-10-06 DIAGNOSIS — F1120 Opioid dependence, uncomplicated: Principal | ICD-10-CM

## 2013-10-06 MED ORDER — OLANZAPINE 5 MG PO TBDP
5.0000 mg | ORAL_TABLET | Freq: Two times a day (BID) | ORAL | Status: DC | PRN
  Administered 2013-10-07 (×3): 5 mg via ORAL
  Filled 2013-10-06 (×4): qty 1

## 2013-10-06 MED ORDER — HYDROXYZINE HCL 50 MG PO TABS
50.0000 mg | ORAL_TABLET | Freq: Four times a day (QID) | ORAL | Status: AC | PRN
  Administered 2013-10-06 – 2013-10-08 (×6): 50 mg via ORAL
  Filled 2013-10-06 (×6): qty 1

## 2013-10-06 NOTE — [facility] Group Notes (Signed)
[facility] Group Notes:  (Nursing/MHT/Case Management/Adjunct)  Date:  10/06/2013  Time:  12:38 PM  Type of Therapy:  Psychoeducational Skills  Participation Level:  Did Not Attend  Mason Jones 10/06/2013, 12:38 PM 

## 2013-10-06 NOTE — Progress Notes (Signed)
Patient ID: Mason Jones, male   DOB: 05/30/1985, 28 y.o.   MRN: [id number]   D: Pt has been very flat and depressed on the unit today, he has also been very agitated. Pt reported that he just wanted to be left alone. Pt did not attend any groups, nor did he engage in any treatment. Pt was in the bed most of the day. Pt reported being negative SI/HI, no AH/VH noted. A: 15 min checks continued for patient safety. R: Pt safety maintained.

## 2013-10-06 NOTE — Progress Notes (Signed)
Psychoeducational Group Note  Date:  10/06/2013 Time:  2100       Group Topic/Focus:  wrap up group  Participation Level: Did Not Attend  Participation Quality:  Not Applicable  Affect:  Not Applicable  Cognitive:  Not Applicable  Insight:  Not Applicable  Engagement in Group: Not Applicable  Additional Comments: Pt remained in bed during group time.   Shelah LewandowskySquires, Keiera Strathman Carol 10/06/2013, 11:05 PM

## 2013-10-06 NOTE — [facility] Group Notes (Signed)
[facility] Group Notes:  (Nursing/MHT/Case Management/Adjunct)  Date:  10/06/2013  Time:  1:51 PM  Type of Therapy:  Psychoeducational Skills  Participation Level:  Did Not Attend   Buford DresserForrest, Aleksandr Pellow Shanta 10/06/2013, 1:51 PM

## 2013-10-06 NOTE — Progress Notes (Signed)
D.  Pt anxious and irritable on approach, did not feel well enough to attend evening wrap up group.  Pt reports that he is normally on Subutex and that this is what has worked for him in the past.  Pt wants to be restarted on this.  Denies SI/HI/hallucinations at this time.  States that he just feels "terrible".    A.  Support and encouragement offered, medication given as ordered for withdrawal symptoms.  R.  Pt remains safe on unit, will continue to monitor.

## 2013-10-06 NOTE — [facility] Group Notes (Signed)
[facility] Group Notes: (Clinical Social Work)   10/06/2013      Type of Therapy:  Group Therapy   Participation Level:  Did Not Attend    Tessah Patchen Grossman-Orr, LCSW 10/06/2013, 11:47 AM     

## 2013-10-06 NOTE — Progress Notes (Signed)
[facility] MD Progress Note  10/06/2013 9:56 AM Mason Jones  MRN:  [id number] Subjective: Patient seen and chart reviewed.  28 year old male who presented to [facility] for SI and requesting detox from opioids.  He reports resting poorly last night. He also notes an increase in w/d symptoms that include body aches, tremors, chills, dry mouth, headache, and goose bumps. He describes it as being hit by a [employer] truck.  Reports cravings to use and can taste it. He is requesting medication adjustment of his ativan stating the medication runs out every 5-45 minutes, and its not lasting long enough. Currently rates his depression 9/10, anxiety 10/10, and hopelessness 10/10. Upon discharge he is planning to go to a long term rehab. Continues to endorse SI, denies HI/AVH however he is able to contract for safety.   Diagnosis: suicidal ideation; opioid dependence Total Time spent with patient: 15 minutes  ADL's:  Intact  Sleep: Fair  Appetite:  Fair  Suicidal Ideation:  Plan:  to shoot himself, to jump off of a bridge, or running in front of a car Homicidal Ideation:  denies  Psychiatric Specialty Exam: Physical Exam  Constitutional: He is oriented to person, place, and time.  Neck: Normal range of motion.  GI: Soft.  Musculoskeletal: Normal range of motion.  Neurological: He is alert and oriented to person, place, and time. He displays abnormal reflex.  Skin: Skin is warm and dry.    Review of Systems  Musculoskeletal: Positive for back pain and myalgias.  Neurological: Positive for dizziness and tremors.  Psychiatric/Behavioral: Positive for depression, suicidal ideas and substance abuse. Negative for hallucinations. The patient is nervous/anxious and has insomnia.   All other systems reviewed and are negative.   Blood pressure 85/52, pulse 101, temperature 97.8 F (36.6 C), temperature source Oral, resp. rate 16, height 5\' 10"  (1.778 m), weight 62.37 kg (137 lb 8 oz), SpO2 100.00%.Body mass index is  19.73 kg/(m^2).  General Appearance: Fairly Groomed  Patent attorney::  Poor  Speech:  Blocked and Normal Rate  Volume:  Normal  Mood:  Anxious  Affect:  Depressed and Flat  Thought Process:  Coherent and Linear  Orientation:  Full (Time, Place, and Person)  Thought Content:  Negative  Suicidal Thoughts:  Yes.  with intent/plan  Homicidal Thoughts:  No  Memory:  Immediate;   Fair Recent;   Fair  Judgement:  Impaired  Insight:  Lacking  Psychomotor Activity:  Negative  Concentration:  Fair  Recall:  [employer] of Knowledge:Fair  Language: Fair  Akathisia:  Negative  Handed:    AIMS (if indicated):     Assets:  Communication Skills Desire for Improvement  Sleep:  Number of Hours: 6    Current Medications: Current Facility-Administered Medications  Medication Dose Route Frequency Provider Last Rate Last Dose  . acetaminophen (TYLENOL) tablet 650 mg  650 mg Oral Q6H PRN Benjaman Pott, MD      . alum & mag hydroxide-simeth (MAALOX/MYLANTA) 200-200-20 MG/5ML suspension 30 mL  30 mL Oral Q4H PRN Benjaman Pott, MD      . cloNIDine (CATAPRES) tablet 0.1 mg  0.1 mg Oral BH-qamhs Evanna Janann August, NP       Followed by  . [START ON 10/09/2013] cloNIDine (CATAPRES) tablet 0.1 mg  0.1 mg Oral QAC breakfast Evanna Janann August, NP      . dicyclomine (BENTYL) tablet 20 mg  20 mg Oral Q6H PRN Benjaman Pott, MD   20 mg  at 10/05/13 2032  . feeding supplement (ENSURE COMPLETE) (ENSURE COMPLETE) liquid 237 mL  237 mL Oral TID BM Jeoffrey MassedLaura Lee Jobe, RD   237 mL at 10/05/13 2031  . hydrOXYzine (ATARAX/VISTARIL) tablet 25 mg  25 mg Oral Q6H PRN Benjaman PottGerald D Taylor, MD   25 mg at 10/05/13 2032  . loperamide (IMODIUM) capsule 2-4 mg  2-4 mg Oral PRN Benjaman PottGerald D Taylor, MD      . LORazepam (ATIVAN) tablet 1 mg  1 mg Oral Q6H PRN Rachael FeeIrving A Lugo, MD   1 mg at 10/06/13 [id number]  . magnesium hydroxide (MILK OF MAGNESIA) suspension 30 mL  30 mL Oral Daily PRN Benjaman PottGerald D Taylor, MD      . methocarbamol (ROBAXIN) tablet  500 mg  500 mg Oral Q8H PRN Benjaman PottGerald D Taylor, MD   500 mg at 10/06/13 [id number]  . naproxen (NAPROSYN) tablet 500 mg  500 mg Oral BID PRN Benjaman PottGerald D Taylor, MD   500 mg at 10/05/13 2033  . nicotine polacrilex (NICORETTE) gum 2 mg  2 mg Oral PRN Rachael FeeIrving A Lugo, MD   2 mg at 10/05/13 1704  . OLANZapine (ZYPREXA) tablet 5 mg  5 mg Oral QHS Nehemiah MassedFernando Cobos, MD   5 mg at 10/05/13 2113  . ondansetron (ZOFRAN-ODT) disintegrating tablet 4 mg  4 mg Oral Q6H PRN Benjaman PottGerald D Taylor, MD      . traZODone (DESYREL) tablet 50 mg  50 mg Oral QHS,MR X 1 Benjaman PottGerald D Taylor, MD   50 mg at 10/05/13 2111  . venlafaxine XR (EFFEXOR-XR) 24 hr capsule 37.5 mg  37.5 mg Oral Q breakfast Nehemiah MassedFernando Cobos, MD   37.5 mg at 10/06/13 0800    Lab Results:  No results found for this or any previous visit (from the past 48 hour(s)).  Physical Findings: AIMS: Facial and Oral Movements Muscles of Facial Expression: None, normal Lips and Perioral Area: None, normal Jaw: None, normal Tongue: None, normal,Extremity Movements Upper (arms, wrists, hands, fingers): None, normal Lower (legs, knees, ankles, toes): None, normal, Trunk Movements Neck, shoulders, hips: None, normal, Overall Severity Severity of abnormal movements (highest score from questions above): None, normal Incapacitation due to abnormal movements: None, normal Patient's awareness of abnormal movements (rate only patient's report): No Awareness,    CIWA:  CIWA-Ar Total: 11 COWS:  COWS Total Score: 6  Treatment Plan Summary: Daily contact with patient to assess and evaluate symptoms and progress in treatment Medication management  Plan: 1. Admit inpatient to 300 hall.  2. Crisis management and stabilization.  2. Medication management:Will increase vistaril 50mg  1 tablet po Q6hr. Pt advised no adjustment to Ativan will be made at this time.   4.. Encourage patient to attend groups and participate in group counseling sessions and activities.  6. Address health issues: Vitals  reviewed and stable; [facility].   I certify that inpatient services furnished can reasonably be expected to improve the patient's condition.   Malachy ChamberSTARKES, Cai Flott S FNP-BC 10/06/2013, 9:56 AM

## 2013-10-07 NOTE — Progress Notes (Signed)
[facility] Group Notes:  (Nursing/MHT/Case Management/Adjunct)  Date:  10/07/2013  Time:  6:33 PM  Type of Therapy:  Psychoeducational Skills  Participation Level:  Active  Participation Quality:  Appropriate and Attentive  Affect:  Appropriate  Cognitive:  Appropriate  Insight:  Appropriate  Engagement in Group:  Engaged  Modes of Intervention:  Activity  Summary of Progress/Problems: Pts played a game of Pictionary using coping skills.  Mason Jones 10/07/2013, 6:33 PM 
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
## 2013-10-07 NOTE — Progress Notes (Signed)
Adult Activity Group Note  Date:  10/07/2013  Time:  8:43 AM  Group Topic:  Mental Health Jeopardy  Participation Level:  Did not attend. Pt was in bed asleep.  Activity: Patients participated in jeopardy like game, answering questions relating to mental health awareness in categories such as Symptoms, Medications, Causes, Coping Skills, and Substance Abuse.    Caswell CorwinOwen, Sharni Negron C 10/07/2013, 8:43 AM

## 2013-10-07 NOTE — [facility] Group Notes (Signed)
[facility] Group Notes:  (Nursing/MHT/Case Management/Adjunct)  Date:  10/07/2013  Time:  4:13 PM  Type of Therapy:  Psychoeducational Skills  Participation Level:  Did Not Attend   Buford DresserForrest, Harout Scheurich Shanta 10/07/2013, 4:13 PM

## 2013-10-07 NOTE — Progress Notes (Signed)
D.  Pt continues to be anxious and to hope for Subutex from Dr. Dub MikesLugo tomorrow.  He wishes to speak to the doctor tomorrow as soon as possible.  Pt denies SI/HI/hallucinations at this time.  Pt did not feel well enough to attend evening AA group, interacting minimally on unit due to withdrawal.   Pt did stated that witnessing a peer receive an insulin shot increased his cravings tonight.   A.  Support and encouragement offered, medications given as ordered for withdrawal  R.  Pt remains safe on unit, will continue to monitor.

## 2013-10-07 NOTE — Progress Notes (Signed)
Patient did not attend the evening speaker AA meeting. Pt was notified that group was beginning but remained in his bed.  

## 2013-10-07 NOTE — [facility] Group Notes (Signed)
[facility] Group Notes: (Clinical Social Work)   10/07/2013      Type of Therapy:  Group Therapy   Participation Level:  Did Not Attend    Ambrose MantleMareida Grossman-Orr, LCSW 10/07/2013, 12:13 PM

## 2013-10-07 NOTE — [facility] Group Notes (Signed)
[facility] Group Notes:  (Nursing/MHT/Case Management/Adjunct)  Date:  10/07/2013  Time:  3:52 PM  Type of Therapy:  Psychoeducational Skills  Participation Level:  Active  Participation Quality:  Appropriate  Affect:  Appropriate  Cognitive:  Appropriate  Insight:  Appropriate  Engagement in Group:  Engaged  Modes of Intervention:  Discussion  Summary of Progress/Problems: Pt did attend self inventory group, pt reported that he was negative SI/HI, no AH/VH noted. Pt rated his depression as a 6, and his helplessness/hopelessness as a 0.     Pt reported concerns about his detox medication, pt advised that the doctor will be made aware.   Jacquelyne BalintForrest, Akif Weldy Shanta 10/07/2013, 3:52 PM

## 2013-10-07 NOTE — Progress Notes (Signed)
Patient ID: Mason Jones, male   DOB: 12/15/1985, [age] y.o.   MRN: [id number]   D: Pt has been in the bed most of the day, he did attend one group but only wanted to let everyone know that his detox medication was not working. After [facility] with the [facility], he went to his room and went to sleep. Pt has no insight and does not engage in treatment, patient reported that the only thing that is going to help him is Suboxone. Pt slept most of the day, only woke up to request medication. Pt reported being negative SI/HI, no AH/VH noted. A: 15 min checks continued for patient safety. R: Pt safety maintained.

## 2013-10-07 NOTE — Progress Notes (Signed)
[facility] MD Progress Note  10/07/2013 2:49 PM Mason Jones  MRN:  [id number] Subjective: Patient seen and chart reviewed.  Pt states he is feeling horrible.  He also notes an increase in w/d symptoms that include body aches, tremors, chills, dry mouth, headache, and goose bumps. He is inquiring why his symptoms are not subsiding, and that he is not getting the treatment he needs. He states he has been to several detox places before and was always given Subutex for treatment. He feels like he made a mistake coming here. He is requesting medication adjustment of his ativan stating the medication runs out everyq4hrs, and its not lasting long enough. Reviewed medication profile with patient, and he was advised that there was increase in VIstaril to help with his anxiety. Currently rates his depression 9/10, anxiety 10/10, and hopelessness 10/10. Upon discharge he is planning to go to a long term rehab. Continues to endorse SI. He reports hearing voices say "walk out and go get high and walk out go kill yourself". Denies visual hallucinations. However he is able to contract for safety.   Diagnosis: suicidal ideation; opioid dependence Total Time spent with patient: 15 minutes  ADL's:  Intact  Sleep: Fair  Appetite:  Fair  Suicidal Ideation:  Plan:  to shoot himself, to jump off of a bridge, or running in front of a car Homicidal Ideation:  denies  Psychiatric Specialty Exam: Physical Exam  Constitutional: He is oriented to person, place, and time.  Neck: Normal range of motion.  GI: Soft.  Musculoskeletal: Normal range of motion.  Neurological: He is alert and oriented to person, place, and time. He displays abnormal reflex.  Skin: Skin is warm and dry.    Review of Systems  Musculoskeletal: Positive for back pain and myalgias.  Neurological: Positive for dizziness and tremors.  Psychiatric/Behavioral: Positive for depression, suicidal ideas and substance abuse. Negative for hallucinations. The  patient is nervous/anxious and has insomnia.   All other systems reviewed and are negative.   Blood pressure 109/53, pulse 76, temperature 97.5 F (36.4 C), temperature source Oral, resp. rate 16, height 5\' 10"  (1.778 m), weight 62.37 kg (137 lb 8 oz), SpO2 100.00%.Body mass index is 19.73 kg/(m^2).  General Appearance: Fairly Groomed  Patent attorney::  Poor  Speech:  Blocked and Normal Rate  Volume:  Normal  Mood:  Anxious  Affect:  Depressed and Flat  Thought Process:  Coherent and Linear  Orientation:  Full (Time, Place, and Person)  Thought Content:  Negative  Suicidal Thoughts:  Yes.  with intent/plan  Homicidal Thoughts:  No  Memory:  Immediate;   Fair Recent;   Fair  Judgement:  Impaired  Insight:  Lacking  Psychomotor Activity:  Negative  Concentration:  Fair  Recall:  [employer] of Knowledge:Fair  Language: Fair  Akathisia:  Negative  Handed:    AIMS (if indicated):     Assets:  Communication Skills Desire for Improvement  Sleep:  Number of Hours: 5.25    Current Medications: Current Facility-Administered Medications  Medication Dose Route Frequency Provider Last Rate Last Dose  . acetaminophen (TYLENOL) tablet 650 mg  650 mg Oral Q6H PRN Benjaman Pott, MD      . alum & mag hydroxide-simeth (MAALOX/MYLANTA) 200-200-20 MG/5ML suspension 30 mL  30 mL Oral Q4H PRN Benjaman Pott, MD      . cloNIDine (CATAPRES) tablet 0.1 mg  0.1 mg Oral BH-qamhs Evanna Janann August, NP   0.1 mg at 10/07/13  0840   Followed by  . [START ON 10/09/2013] cloNIDine (CATAPRES) tablet 0.1 mg  0.1 mg Oral QAC breakfast Evanna Janann Augustori Burkett, NP      . dicyclomine (BENTYL) tablet 20 mg  20 mg Oral Q6H PRN Benjaman PottGerald D Taylor, MD   20 mg at 10/06/13 2146  . feeding supplement (ENSURE COMPLETE) (ENSURE COMPLETE) liquid 237 mL  237 mL Oral TID BM Jeoffrey MassedLaura Lee Jobe, RD   237 mL at 10/07/13 1400  . hydrOXYzine (ATARAX/VISTARIL) tablet 50 mg  50 mg Oral Q6H PRN Truman Haywardakia S Starkes, FNP   50 mg at 10/07/13 0840  .  loperamide (IMODIUM) capsule 2-4 mg  2-4 mg Oral PRN Benjaman PottGerald D Taylor, MD      . LORazepam (ATIVAN) tablet 1 mg  1 mg Oral Q6H PRN Rachael FeeIrving A Lugo, MD   1 mg at 10/07/13 0840  . magnesium hydroxide (MILK OF MAGNESIA) suspension 30 mL  30 mL Oral Daily PRN Benjaman PottGerald D Taylor, MD      . methocarbamol (ROBAXIN) tablet 500 mg  500 mg Oral Q8H PRN Benjaman PottGerald D Taylor, MD   500 mg at 10/07/13 0840  . naproxen (NAPROSYN) tablet 500 mg  500 mg Oral BID PRN Benjaman PottGerald D Taylor, MD   500 mg at 10/06/13 2146  . nicotine polacrilex (NICORETTE) gum 2 mg  2 mg Oral PRN Rachael FeeIrving A Lugo, MD   2 mg at 10/05/13 1704  . OLANZapine (ZYPREXA) tablet 5 mg  5 mg Oral QHS Nehemiah MassedFernando Cobos, MD   5 mg at 10/06/13 2147  . OLANZapine zydis (ZYPREXA) disintegrating tablet 5 mg  5 mg Oral BID PRN Truman Haywardakia S Starkes, FNP   5 mg at 10/07/13 0840  . ondansetron (ZOFRAN-ODT) disintegrating tablet 4 mg  4 mg Oral Q6H PRN Benjaman PottGerald D Taylor, MD      . traZODone (DESYREL) tablet 50 mg  50 mg Oral QHS,MR X 1 Benjaman PottGerald D Taylor, MD   50 mg at 10/06/13 2147  . venlafaxine XR (EFFEXOR-XR) 24 hr capsule 37.5 mg  37.5 mg Oral Q breakfast Nehemiah MassedFernando Cobos, MD   37.5 mg at 10/07/13 0840    Lab Results:  No results found for this or any previous visit (from the past 48 hour(s)).  Physical Findings: AIMS: Facial and Oral Movements Muscles of Facial Expression: None, normal Lips and Perioral Area: None, normal Jaw: None, normal Tongue: None, normal,Extremity Movements Upper (arms, wrists, hands, fingers): None, normal Lower (legs, knees, ankles, toes): None, normal, Trunk Movements Neck, shoulders, hips: None, normal, Overall Severity Severity of abnormal movements (highest score from questions above): None, normal Incapacitation due to abnormal movements: None, normal Patient's awareness of abnormal movements (rate only patient's report): No Awareness,    CIWA:  CIWA-Ar Total: 9 COWS:  COWS Total Score: 0  Treatment Plan Summary: Daily contact with patient to  assess and evaluate symptoms and progress in treatment Medication management  Plan: 1. Admit inpatient to 300 hall.  2. Crisis management and stabilization.  2. Medication management:Will increase vistaril 50mg  1 tablet po Q6hr. Pt advised no adjustment to Ativan will be made at this time.  Zyprexa 5mg  1 tablet po BID prn for agitation.  4.. Encourage patient to attend groups and participate in group counseling sessions and activities.  6. Address health issues: Vitals reviewed and stable; [facility] for CMP and CBC.   I certify that inpatient services furnished can reasonably be expected to improve the patient's condition.   Darcella GasmanSTARKES,  Lonisha Bobby S FNP-BC 10/07/2013, 2:49 PM

## 2013-10-08 DIAGNOSIS — F339 Major depressive disorder, recurrent, unspecified: Secondary | ICD-10-CM | POA: Diagnosis present

## 2013-10-08 DIAGNOSIS — F1193 Opioid use, unspecified with withdrawal: Secondary | ICD-10-CM | POA: Diagnosis present

## 2013-10-08 DIAGNOSIS — F4310 Post-traumatic stress disorder, unspecified: Secondary | ICD-10-CM | POA: Diagnosis present

## 2013-10-08 DIAGNOSIS — F1994 Other psychoactive substance use, unspecified with psychoactive substance-induced mood disorder: Secondary | ICD-10-CM

## 2013-10-08 DIAGNOSIS — F411 Generalized anxiety disorder: Secondary | ICD-10-CM | POA: Diagnosis present

## 2013-10-08 MED ORDER — DOCUSATE SODIUM 100 MG PO CAPS
100.0000 mg | ORAL_CAPSULE | Freq: Two times a day (BID) | ORAL | Status: DC
  Administered 2013-10-08 – 2013-10-11 (×7): 100 mg via ORAL
  Filled 2013-10-08 (×19): qty 1

## 2013-10-08 MED ORDER — MAGNESIUM HYDROXIDE 400 MG/5ML PO SUSP: 30.0000 mL | Freq: Every day | ORAL | Status: DC | PRN

## 2013-10-08 MED ORDER — BUPRENORPHINE HCL 2 MG SL SUBL
4.0000 mg | SUBLINGUAL_TABLET | Freq: Two times a day (BID) | SUBLINGUAL | Status: DC
  Administered 2013-10-08 – 2013-10-10 (×5): 4 mg via SUBLINGUAL
  Filled 2013-10-08 (×5): qty 2

## 2013-10-08 MED ORDER — NICOTINE 21 MG/24HR TD PT24
21.0000 mg | MEDICATED_PATCH | Freq: Every day | TRANSDERMAL | Status: DC
  Administered 2013-10-08 – 2013-10-09 (×2): 21 mg via TRANSDERMAL
  Filled 2013-10-08 (×3): qty 1

## 2013-10-08 MED ORDER — VENLAFAXINE HCL ER 75 MG PO CP24
75.0000 mg | ORAL_CAPSULE | Freq: Every day | ORAL | Status: DC
  Administered 2013-10-09 – 2013-10-15 (×7): 75 mg via ORAL
  Filled 2013-10-08 (×8): qty 1
  Filled 2013-10-08: qty 14

## 2013-10-08 MED ORDER — POLYETHYLENE GLYCOL 3350 17 G PO PACK
17.0000 g | PACK | Freq: Every day | ORAL | Status: DC
  Administered 2013-10-08 – 2013-10-10 (×3): 17 g via ORAL
  Filled 2013-10-08 (×6): qty 1

## 2013-10-08 NOTE — Progress Notes (Signed)
D: Pt anxious at beginning of shift, fidgety.  Pt reported withdrawal symptoms of: tremor, hot/cold flashes.  Pt visibly anxious at beginning of shift.  Pt denies SI/HI, denies thoughts of self harm.  Pt denies AH/VH.  Pt interacted with peers appropriately, was playing cards with peers earlier in shift.  Pt c/o back pain of 6/10.  Pt reported his goal was to "stay sober" A: Pt received scheduled Subutex and withdrawal symptoms decreased.  Pt received Tylenol 650mg  PO PRN for back pain of 6/10.  Support and encouragement provided.  Continue with POC.   R: Will continue to monitor pt for safety.  Pain relieved by Tylenol 650mg  PO PRN.

## 2013-10-08 NOTE — [facility] Suicide Risk Assessment (Signed)
[facility] INPATIENT:  Family/Significant Other Suicide Prevention Education  Suicide Prevention Education:  Patient Refusal for Family/Significant Other Suicide Prevention Education: The patient Mason Jones has refused to provide written consent for family/significant other to be provided Family/Significant Other Suicide Prevention Education during admission and/or prior to discharge.  Physician notified.  Pt reports no positive family or friend supports. SPE completed with pt. SPI pamphlet provided to pt and he was encouraged to share information with support network, ask questions, and talk about any concerns relating to SPE.  Smart, Mason Jones LCSWA  10/08/2013, 2:59 PM

## 2013-10-08 NOTE — Progress Notes (Signed)
Patient ID: Mason Jones, male   DOB: 01/24/1986, 28 y.o.   MRN: [id number] He has been in bed most of the day up for part of one group and up in hallway part of the time. He has requested and received prn medication today for withdrawal pain and anxiety that helped. Self inventory: depression 9, hopelessness 10, withdrawals of craving agitation , agitation. Tremors, and physical withdrawal pain. He  Denies SI thoughts. He has been brighter after he received  His subutex this afternoon.
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
## 2013-10-08 NOTE — Clinical Social Work Note (Signed)
CSW met with pt individually. CSW provided pt with [facility] and requested info to [place] recovery centers. Pt provided with information and application to [facility] $150 fee for admission, [facility] info (admission fee required), and [facility] info (weekly fee required). Per Pt request, [facility] referral made today, 10/08/13. Pt presenting as anxious with irritable mood but expresses interest in further SA treatment. He was encouraged to read through information today and tonight and CSW will check in with him in the morning/continue checking on [facility] referral.   [place], LCSWA

## 2013-10-08 NOTE — Progress Notes (Signed)
Adult Psychoeducational Group Note  Date:  10/08/2013 Time:  10:02 PM  Group Topic/Focus:  Wrap-Up Group:   The focus of this group is to help patients review their daily goal of treatment and discuss progress on daily workbooks.  Participation Level:  Active  Participation Quality:  Appropriate  Affect:  Appropriate  Cognitive:  Appropriate  Insight: Appropriate  Engagement in Group:  Engaged  Modes of Intervention:  Support  Additional Comments:  Pt states that positive thing that happened today is that he got his meds working and that hsi goal for tomorrow is to stay sober and do work: work on12 steps and call his sponsor. Advice to peers is to keep it in the day and to make amends with anyone they have have wronged. Pt was given support.  Ingrid Shifrin 10/08/2013, 10:02 PM

## 2013-10-08 NOTE — [facility] Group Notes (Signed)
[facility]   10/08/2013 9:43 AM  Participation Quality:  Minimal   Mood/Affect:  Depressed and Irritable  Depression Rating:  10  Anxiety Rating:  10  Thoughts of Suicide:  Yes Will you contract for safety?   Yes  Current AVH:  No  Plan for Discharge/Comments:  Pt reports that [place] go to [place] facility for "at least 90 days." pt reports that he does not know where in [place] to go, has no insurance, and no disposable income. Pt minimally invested in working with CSW to discuss options and eventually stopped responding during group and closed his eyes. Pt irritable and focused heavily on getting prescribed suboxine.  Transportation Means: unknown at this time   Supports: pt did not identify supports   Smart, American FinancialHeather LCSWA

## 2013-10-09 MED ORDER — MAGNESIUM CITRATE PO SOLN
1.0000 | Freq: Once | ORAL | Status: AC
  Administered 2013-10-09: 1 via ORAL

## 2013-10-09 MED ORDER — TRAZODONE HCL 100 MG PO TABS
100.0000 mg | ORAL_TABLET | Freq: Every evening | ORAL | Status: DC | PRN
  Administered 2013-10-09 – 2013-10-10 (×2): 100 mg via ORAL
  Filled 2013-10-09 (×6): qty 1

## 2013-10-09 MED ORDER — NICOTINE POLACRILEX 2 MG MT GUM
2.0000 mg | CHEWING_GUM | OROMUCOSAL | Status: DC | PRN
  Administered 2013-10-09 – 2013-10-15 (×17): 2 mg via ORAL
  Filled 2013-10-09 (×3): qty 1

## 2013-10-09 NOTE — Clinical Social Work Note (Signed)
[facility] Pt stated that he has proof that [facility] and requested that CSW send ROI to halfway house case worker for this proof. Pt signed release allowing this. CSW faxed ms. Dennie Bibleat [phone] ROI per pt request and asked for call once received in order to work on getting this letter.   The Sherwin-WilliamsHeather Smart, LCSWA 10/09/2013 3:20 PM

## 2013-10-09 NOTE — Progress Notes (Signed)
[facility]  10/09/2013 4:43 PM [name]  MRN:  [id number] Subjective:  Mason FearingJames continues to have a hard time. The Subutex has help to curb a lot of the withdrawal. As he is more "clear headed"  he admits he is having more thoughts about his grandmother and feeling guilty that he did not try to wake her up earlier and that if he had done so, she would not have died. He admits he uses to deal with all this pain. Moving forward he states he needs to pursue further rehab. States he will not make it otherwise Diagnosis:   DSM5: Schizophrenia Disorders:  none Obsessive-Compulsive Disorders:  none Trauma-Stressor Disorders:  Posttraumatic Stress Disorder (309.81) Substance/Addictive Disorders:  Opioid Disorder - Severe (304.00) Depressive Disorders:  Major Depressive Disorder - Moderate (296.22) Total Time spent with patient: 30 minutes  Axis I: Generalized Anxiety Disorder  ADL's:  Intact  Sleep: Poor  Appetite:  Poor  Suicidal Ideation:  Plan:  denies Intent:  denies Means:  denies Homicidal Ideation:  Plan:  denies Intent:  denies Means:  denies AEB (as evidenced by):  Psychiatric Specialty Exam: Physical Exam  Review of Systems  Constitutional: Positive for malaise/fatigue.  HENT: Negative.   Eyes: Negative.   Respiratory: Negative.   Cardiovascular: Negative.   Gastrointestinal: Positive for constipation.  Genitourinary: Negative.   Musculoskeletal: Positive for myalgias.  Skin: Negative.   Neurological: Positive for weakness.  Endo/Heme/Allergies: Negative.   Psychiatric/Behavioral: Positive for depression and substance abuse. The patient is nervous/anxious and has insomnia.     Blood pressure 133/59, pulse 80, temperature 98.3 F (36.8 C), temperature source Oral, resp. rate 16, height 5\' 10"  (1.778 m), weight 62.37 kg (137 lb 8 oz), SpO2 100.00%.Body mass index is 19.73 kg/(m^2).  General Appearance: Fairly Groomed  Patent attorneyye Contact::  Minimal  Speech:  Clear  and Coherent  Volume:  Decreased  Mood:  Anxious and Depressed  Affect:  Restricted  Thought Process:  Coherent and Goal Directed  Orientation:  Full (Time, Place, and Person)  Thought Content:  symptoms, events worries and concerns  Suicidal Thoughts:  No  Homicidal Thoughts:  No  Memory:  Immediate;   Fair Recent;   Fair Remote;   Fair  Judgement:  Fair  Insight:  Present  Psychomotor Activity:  Restlessness  Concentration:  Fair  Recall:  FiservFair  Fund of Knowledge:NA  Language: Fair  Akathisia:  No  Handed:    AIMS (if indicated):     Assets:  Desire for Improvement  Sleep:  Number of Hours: 3   Musculoskeletal: Strength & Muscle Tone: within normal limits Gait & Station: normal Patient leans: N/A  Current Medications: Current Facility-Administered Medications  Medication Dose Route Frequency Provider Last Rate Last Dose  . acetaminophen (TYLENOL) tablet 650 mg  650 mg Oral Q6H PRN Benjaman PottGerald D Taylor, MD   650 mg at 10/08/13 2110  . alum & mag hydroxide-simeth (MAALOX/MYLANTA) 200-200-20 MG/5ML suspension 30 mL  30 mL Oral Q4H PRN Benjaman PottGerald D Taylor, MD      . buprenorphine (SUBUTEX) SL tablet 4 mg  4 mg Sublingual BID Rachael FeeIrving A Deny Chevez, MD   4 mg at 10/09/13 [id number]  . cloNIDine (CATAPRES) tablet 0.1 mg  0.1 mg Oral QAC breakfast Audrea MuscatEvanna Cori Burkett, NP   0.1 mg at 10/09/13 [id number]  . docusate sodium (COLACE) capsule 100 mg  100 mg Oral BID Rachael FeeIrving A Keeshia Sanderlin, MD   100 mg at 10/09/13 0807  . feeding supplement (  ENSURE COMPLETE) (ENSURE COMPLETE) liquid 237 mL  237 mL Oral TID BM Jeoffrey Massed, RD   237 mL at 10/09/13 0808  . LORazepam (ATIVAN) tablet 1 mg  1 mg Oral Q6H PRN Rachael Fee, MD   1 mg at 10/09/13 1034  . magnesium hydroxide (MILK OF MAGNESIA) suspension 30 mL  30 mL Oral Daily PRN Benjaman Pott, MD      . magnesium hydroxide (MILK OF MAGNESIA) suspension 30 mL  30 mL Oral Daily PRN Rachael Fee, MD      . nicotine (NICODERM CQ - dosed in mg/24 hours) patch 21 mg  21 mg  Transdermal Q0600 Rachael Fee, MD   21 mg at 10/09/13 4098  . OLANZapine (ZYPREXA) tablet 5 mg  5 mg Oral QHS Nehemiah Massed, MD   5 mg at 10/08/13 2106  . OLANZapine zydis (ZYPREXA) disintegrating tablet 5 mg  5 mg Oral BID PRN Truman Hayward, FNP   5 mg at 10/07/13 2219  . polyethylene glycol (MIRALAX / GLYCOLAX) packet 17 g  17 g Oral Daily Rachael Fee, MD   17 g at 10/09/13 1202  . traZODone (DESYREL) tablet 100 mg  100 mg Oral QHS,MR X 1 Rachael Fee, MD      . venlafaxine XR (EFFEXOR-XR) 24 hr capsule 75 mg  75 mg Oral Q breakfast Rachael Fee, MD   75 mg at 10/09/13 1191    Lab Results: No results found for this or any previous visit (from the past 48 hour(s)).  Physical Findings: AIMS: Facial and Oral Movements Muscles of Facial Expression: None, normal Lips and Perioral Area: None, normal Jaw: None, normal Tongue: None, normal,Extremity Movements Upper (arms, wrists, hands, fingers): None, normal Lower (legs, knees, ankles, toes): None, normal, Trunk Movements Neck, shoulders, hips: None, normal, Overall Severity Severity of abnormal movements (highest score from questions above): None, normal Incapacitation due to abnormal movements: None, normal Patient's awareness of abnormal movements (rate only patient's report): No Awareness,    CIWA:  CIWA-Ar Total: 9 COWS:  COWS Total Score: 4  Treatment Plan Summary: Daily contact with patient to assess and evaluate symptoms and progress in treatment Medication management  Plan: Supportive approach/coping skills/relapse prevention           Subutex 4 mg BID with plans to decrease by 2 mg daily           CBT/mindfulness            Spiritual Counseling Medical Decision Making Problem Points:  Review of psycho-social stressors (1) Data Points:  Review of medication regiment & side effects (2) Review of new medications or change in dosage (2)  I certify that inpatient services furnished can reasonably be expected to improve  the patient's condition.   Clementine Soulliere A 10/09/2013, 4:43 PM

## 2013-10-09 NOTE — Tx Team (Signed)
Interdisciplinary Treatment Plan Update (Adult)  Date: 10/09/2013   Time Reviewed: 11:19 AM  Progress in Treatment:  Attending groups: Intermittently  Participating in groups:  Miniamlly, when he attends.  Taking medication as prescribed: Yes  Tolerating medication: Yes  Family/Significant othe contact made: Not yet. SPE required for pt.   Patient understands diagnosis: Yes, AEB seeking treatment for opioid detox, SI, mood stabilization, and med management.  Discussing patient identified problems/goals with staff: Yes  Medical problems stabilized or resolved: Yes  Denies suicidal/homicidal ideation: Yes, self report.  Patient has not harmed self or Others: Yes  New problem(s) identified:  Discharge Plan or Barriers: [facility] CSW provided pt with several [place] treatment/sobriety houses per his request. CSW assessing for additional referrals.  Additional comments: n/a  Reason for Continuation of Hospitalization: subutex-withdrawals Mood stabilization Med management  Estimated length of stay: 2-4 days  For review of initial/current patient goals, please see plan of care.  Attendees:  Patient:    Family:    Physician: Dr. Dub MikesLugo MD  10/09/2013 11:19 AM   Nursing: Meryl DareJennifer P. RN 10/09/2013 11:19 AM   Clinical Social Worker Jinnie Onley Smart, LCSWA  10/09/2013 11:19 AM   Other: Maury Dusonna Rn  10/09/2013 11:19 AM   Other: Chandra BatchAggie N. PA 10/09/2013 11:19 AM   Other: Darden DatesJennifer C. Nurse CM 10/09/2013 11:19 AM   Other:    Scribe for Treatment Team:  Trula SladeHeather Smart LCSWA 10/09/2013 11:19 AM

## 2013-10-09 NOTE — [facility] Group Notes (Signed)
[facility] LCSW Group Therapy  10/09/2013 2:59 PM  Type of Therapy:  Group Therapy  Participation Level:  Active  Participation Quality:  Attentive  Affect:  Flat and Tearful  Cognitive:  Alert and Oriented  Insight:  Engaged  Engagement in Therapy:  Engaged  Modes of Intervention:  Confrontation, Discussion, Education, Exploration, Problem-solving, Rapport Building, Socialization and Support  Summary of Progress/Problems: [name] came to talk about his personal journey with substance abuse and addiction. The pt processed ways by which to relate to the speaker. [name] provided handouts and educational information pertaining to groups and services offered by the [facility]. Fayrene FearingJames asked questions about presenter's story and about AA 12 steps. He thanked the speaker for sharing his personal story when group concluded.    Smart, Oziel Beitler LCSWA  10/09/2013, 2:59 PM

## 2013-10-09 NOTE — [facility]HH Group Notes (Signed)
[facility] Group Notes:  (Nursing/MHT/Case Management/Adjunct)  Date:  10/09/2013  Time:  0845  Type of Therapy:  Psychoeducational Skills  Participation Level:  Active  Participation Quality:  Appropriate and Attentive  Affect:  Appropriate  Cognitive:  Alert and Appropriate  Insight:  Appropriate and Good  Engagement in Group:  Engaged and Supportive  Modes of Intervention:  Clarification and Support  Summary of Progress/Problems:Morning wellness group; Sleep Hygiene    Mason Jones, Mason Jones [facility] 10/09/2013, 12:34 PM

## 2013-10-09 NOTE — Progress Notes (Signed)
D: Patient in the dayroom on approach.  Patient states he had a good day.  Patient state she is concerned about whee he will go when discharged.  Patient appears not to have insight and continues to be med seekng and needy.  Patient appears to have tremors when he sees Clinical research associatewriter but Clinical research associatewriter observes no tremors when patient is talking to peers.  Patient denied SI/HI and denies AVH.   A: Staff to monitor Q 15 mins for safety.  Encouragement and support offered.  Scheduled medications administered per orders.  R: Patient remains safe on the unit.  Patient attended group tonight.  Patient visible on the unit and interacting with peers.  Patient taking administered medications.

## 2013-10-09 NOTE — Progress Notes (Addendum)
Patient ID: Mason HeirJames Jones, male   DOB: 04/10/1985, 28 y.o.   MRN: [id number]  D: Patient has a flat affect on approach today. Reports poor sleep last night but patient seen sleeping off and on during the day yesterday. Reports continued depression and started on Effexor. Gives depression "6" and feelings of hopelessness "5". Still reports withdrawal symptoms of tremors, chilling, cravings and agitation. Patient started on Subutex yesterday. Currently denies any SI. His plan is to get a sponsor. A: Staff will monitor on q 15 minute checks, follow treatment plan, and give meds as ordered. R: Cooperative on the unit and attended morning group

## 2013-10-09 NOTE — Plan of Care (Signed)
Problem: Alteration in mood & ability to function due to Goal: LTG-Pt reports reduction in suicidal thoughts (Patient reports reduction in suicidal thoughts and is able to verbalize a safety plan for whenever patient is feeling suicidal)  Outcome: Progressing Pt denied SI this shift.   Goal: LTG-Pt verbalizes understanding of importance of med regimen (Patient verbalizes understanding of importance of medication regimen and need to continue outpatient care and support groups)  Outcome: Progressing Pt compliant with scheduled medications and educated r/t medication indications.    Problem: Alteration in mood Goal: LTG-Patient reports reduction in suicidal thoughts (Patient reports reduction in suicidal thoughts and is able to verbalize a safety plan for whenever patient is feeling suicidal)  Outcome: Progressing Pt denied SI this evening.

## 2013-10-09 NOTE — Progress Notes (Signed)
Recreation Therapy Notes  Animal-Assisted Activity/Therapy (AAA/T) Program Checklist/Progress Notes Patient Eligibility Criteria Checklist & Daily Group note for Rec Tx Intervention  Date: 07.14.2015 Time: 2:45pm [address]    AAA/T Program Assumption of Risk Form signed by Patient/ or Parent Legal Guardian yes  Patient is free of allergies or sever asthma yes  Patient reports no fear of animals yes  Patient reports no history of cruelty to animals yes   Patient understands his/her participation is voluntary yes  Patient washes hands before animal contact yes  Patient washes hands after animal contact yes  Behavioral Response: Intermittent attendance.   Education: Charity fundraiserHand Washing, Health visitorAppropriate Animal Interaction   Education Outcome: Acknowledges understanding  Clinical Observations/Feedback: Patient walked in and out of session, while he was in session he wrote in his journal and was observed to rip pages out of his journal and throw them in the trash. Patient did pet therapy dog, but only for a short time.    Marykay Lexenise L Gillis Boardley, LRT/CTRS  Jearl KlinefelterBlanchfield, Sami Roes L 10/09/2013 4:57 PM

## 2013-10-09 NOTE — Progress Notes (Signed)
Patient attended AA meeting. Tonight we had a speaker meeting. They actively listened. Rosilyn MingsMingia, Etola Mull A 10:28 PM

## 2013-10-10 MED ORDER — MAGNESIUM CITRATE PO SOLN
1.0000 | Freq: Once | ORAL | Status: AC
  Administered 2013-10-10: 1 via ORAL

## 2013-10-10 MED ORDER — POLYETHYLENE GLYCOL 3350 17 G PO PACK
17.0000 g | PACK | Freq: Every day | ORAL | Status: DC
  Administered 2013-10-10: 17 g via ORAL
  Filled 2013-10-10 (×9): qty 1

## 2013-10-10 MED ORDER — BUPRENORPHINE HCL 2 MG SL SUBL
2.0000 mg | SUBLINGUAL_TABLET | Freq: Two times a day (BID) | SUBLINGUAL | Status: DC
  Administered 2013-10-10 – 2013-10-11 (×3): 2 mg via SUBLINGUAL
  Filled 2013-10-10 (×3): qty 1

## 2013-10-10 MED ORDER — FLEET ENEMA 7-19 GM/118ML RE ENEM
1.0000 | ENEMA | Freq: Every day | RECTAL | Status: DC | PRN
  Filled 2013-10-10: qty 1

## 2013-10-10 NOTE — Progress Notes (Signed)
Pt attended NA group this evening.  

## 2013-10-10 NOTE — Progress Notes (Signed)
Patient ID: Mason Jones, male   DOB: 09/28/1985, 28 y.o.   MRN: [id number] He has been up and about today has Been interacting more today with peers and staff. Has requested and receive prn for c/o a headache this AM that was effective. Self inventory: depression 6, hopelessness 4, w/d's of tremors craving, chilling, irritable, runny nose, physical pain of light headness dizziness and headache/ back pain. Goal : treatment education,think of other before myself.

## 2013-10-10 NOTE — B[facility] Group Notes (Signed)
[facility] LCSW Group Therapy  10/10/2013 3:20 PM  Type of Therapy:  Group Therapy  Participation Level:  Active  Participation Quality:  Attentive  Affect:  Flat  Cognitive:  Alert and Oriented  Insight:  Improving  Engagement in Therapy:  Improving  Modes of Intervention:  Confrontation, Discussion, Education, Problem-solving, Rapport Building, Socialization and Support  Summary of Progress/Problems: Emotion Regulation: This group focused on both positive and negative emotion identification and allowed group members to process ways to identify feelings, regulate negative emotions, and find healthy ways to manage internal/external emotions. Group members were asked to reflect on a time when their reaction to an emotion led to a negative outcome and explored how alternative responses using emotion regulation would have benefited them. Group members were also asked to discuss a time when emotion regulation was utilized when a negative emotion was experienced. Mason Jones was attentive and engaged throughout today's therapy session. He shared that he struggles with shame and guilt. Mason Jones was able to identify how these emotions differed and how they felt both physically and mentally. Mason Jones shows progress in the group setting AEB his ability to process how getting additional treatment and learning positive affirmations, attending NA, and building a positive support network will help him to avoid relapse by regulating negative emotions.    Smart, Lotus Gover LCSWA  10/10/2013, 3:20 PM

## 2013-10-10 NOTE — Progress Notes (Addendum)
[facility] MD Progress Note  10/10/2013 4:02 PM Mason Jones  MRN:  [id number] Subjective:  Mason Jones is still experiencing Jones lot of anxiety, worry. Still not clear if he can go to Jones residential   treatment center. He is concerned as does not feel he will be able to make it if he does not go to rehab. Still very anxious, worry, cant stop worrying. Still dealing with the constipation.  Diagnosis:   DSM5: Schizophrenia Disorders:  none Obsessive-Compulsive Disorders:  none Trauma-Stressor Disorders:  PTSD Substance/Addictive Disorders:  Opioid Disorder - Severe (304.00) Depressive Disorders:  Major Depressive Disorder - Moderate (296.22) Total Time spent with patient: 30 minutes  Axis I: Generalized Anxiety Disorder  ADL's:  Intact  Sleep: Poor  Appetite:  Fair  Suicidal Ideation:  Plan:  denies Intent:  denies Means:  denies Homicidal Ideation:  Plan:  denies Intent:  denies Means:  denies AEB (as evidenced by):  Psychiatric Specialty Exam: Physical Exam  Review of Systems  Constitutional: Negative.   HENT: Negative.   Eyes: Negative.   Respiratory: Negative.   Cardiovascular: Negative.   Gastrointestinal: Positive for constipation.  Genitourinary: Negative.   Musculoskeletal: Positive for myalgias.  Skin: Negative.   Neurological: Positive for tremors.  Endo/Heme/Allergies: Negative.   Psychiatric/Behavioral: Positive for substance abuse. The patient is nervous/anxious and has insomnia.     Blood pressure 126/57, pulse 82, temperature 98.3 F (36.8 C), temperature source Oral, resp. rate 18, height 5\' 10"  (1.778 m), weight 62.37 kg (137 lb 8 oz), SpO2 100.00%.Body mass index is 19.73 kg/(m^2).  General Appearance: Fairly Groomed  Patent attorney::  Fair  Speech:  Clear and Coherent  Volume:  Normal  Mood:  Anxious, Dysphoric and worried  Affect:  anxious, worried  Thought Process:  Coherent and Goal Directed  Orientation:  Full (Time, Place, and Person)  Thought Content:   symptoms worries concerns  Suicidal Thoughts:  No  Homicidal Thoughts:  No  Memory:  Immediate;   Fair Recent;   Fair Remote;   Fair  Judgement:  Fair  Insight:  Present  Psychomotor Activity:  Restlessness  Concentration:  Fair  Recall:  Fis[employer] of Knowledge:NA  Language: Fair  Akathisia:  No  Handed:    AIMS (if indicated):     Assets:  Desire for Improvement  Sleep:  Number of Hours: 1.75   Musculoskeletal: Strength & Muscle Tone: within normal limits Gait & Station: normal Patient leans: N/Jones  Current Medications: Current Facility-Administered Medications  Medication Dose Route Frequency Provider Last Rate Last Dose  . acetaminophen (TYLENOL) tablet 650 mg  650 mg Oral Q6H PRN Benjaman Pott, MD   650 mg at 10/10/13 1033  . alum & mag hydroxide-simeth (MAALOX/MYLANTA) 200-200-20 MG/5ML suspension 30 mL  30 mL Oral Q4H PRN Benjaman Pott, MD      . buprenorphine (SUBUTEX) SL tablet 2 mg  2 mg Sublingual BID Rachael Fee, MD      . docusate sodium (COLACE) capsule 100 mg  100 mg Oral BID Rachael Fee, MD   100 mg at 10/10/13 4098  . feeding supplement (ENSURE COMPLETE) (ENSURE COMPLETE) liquid 237 mL  237 mL Oral TID BM Jeoffrey Massed, RD   237 mL at 10/10/13 1308  . LORazepam (ATIVAN) tablet 1 mg  1 mg Oral Q6H PRN Rachael Fee, MD   1 mg at 10/10/13 0201  . magnesium hydroxide (MILK OF MAGNESIA) suspension 30 mL  30 mL Oral Daily  PRN Benjaman PottGerald D Taylor, MD      . magnesium hydroxide (MILK OF MAGNESIA) suspension 30 mL  30 mL Oral Daily PRN Rachael FeeIrving Jones Nitish Roes, MD      . nicotine polacrilex (NICORETTE) gum 2 mg  2 mg Oral PRN Rachael FeeIrving Jones Jakayden Cancio, MD   2 mg at 10/10/13 1309  . OLANZapine (ZYPREXA) tablet 5 mg  5 mg Oral QHS Nehemiah MassedFernando Cobos, MD   5 mg at 10/09/13 2127  . OLANZapine zydis (ZYPREXA) disintegrating tablet 5 mg  5 mg Oral BID PRN Truman Haywardakia S Starkes, FNP   5 mg at 10/07/13 2219  . polyethylene glycol (MIRALAX / GLYCOLAX) packet 17 g  17 g Oral Daily Rachael FeeIrving Jones Sunshine Mackowski, MD   17 g at  10/10/13 [phone]  . traZODone (DESYREL) tablet 100 mg  100 mg Oral QHS,MR X 1 Rachael FeeIrving Jones Tyger Wichman, MD   100 mg at 10/09/13 2221  . venlafaxine XR (EFFEXOR-XR) 24 hr capsule 75 mg  75 mg Oral Q breakfast Rachael FeeIrving Jones Nasri Boakye, MD   75 mg at 10/10/13 [id number]    Lab Results: No results found for this or any previous visit (from the past 48 hour(s)).  Physical Findings: AIMS: Facial and Oral Movements Muscles of Facial Expression: None, normal Lips and Perioral Area: None, normal Jaw: None, normal Tongue: None, normal,Extremity Movements Upper (arms, wrists, hands, fingers): None, normal Lower (legs, knees, ankles, toes): None, normal, Trunk Movements Neck, shoulders, hips: None, normal, Overall Severity Severity of abnormal movements (highest score from questions above): None, normal Incapacitation due to abnormal movements: None, normal Patient's awareness of abnormal movements (rate only patient's report): No Awareness,    CIWA:  CIWA-Ar Total: 9 COWS:  COWS Total Score: 7  Treatment Plan Summary: Daily contact with patient to assess and evaluate symptoms and progress in treatment Medication management  Plan: Supportive approach/coping skills/relapse prevention           CBT;mindfulness            Wean off the Subutex, decrease by 2 mg every day             Explore other residential treatment options Medical Decision Making Problem Points:  Review of psycho-social stressors (1) Data Points:  Review of medication regiment & side effects (2) Review of new medications or change in dosage (2)  I certify that inpatient services furnished can reasonably be expected to improve the patient's condition.   Mason Jones 10/10/2013, 4:02 PM

## 2013-10-10 NOTE — Clinical Social Work Note (Signed)
CSW met with pt individually to discuss aftercare plan. Pt told again that he was denied at [facility] due to recent stay at [facility]. Per pt request, his information was submitted to [facility]. CSW spoke with Merry Proud who denied pt=he is currently set up for services at [facility] and must get [employer] ID, get bill proving [employer] address, and drop services including [facility] in [place] in order to be eligible for [place] provided with [facility] program info/application and was given [facility] information. Pt also provided with comprehensive [place] house list and encouraged to look into this resource.   [place], [place] 10/10/2013 3:42 PM

## 2013-10-10 NOTE — BHH Group Notes (Signed)
[facility]   10/10/2013 10:09 AM  Participation Quality: Minimal   Mood/Affect:  Depressed and Irritable  Depression Rating:  4  Anxiety Rating:  4  Thoughts of Suicide:  No Will you contract for safety?   NA  Current AVH:  No  Plan for Discharge/Comments:  Pt stated that CSW must call sober living house to get proof that he was not at [facility] 9 days ago-[facility] although CSW explained that he was denied. Pt was provided with several sober living/treatment center resources in [place] 2 days ago, per his request. [facility]. CSW assessing. Moderate withdrawals reported by pt today.   Transportation Means: unknown at this time.   Supports: none identified by pt.   Smart, American FinancialHeather LCSWA

## 2013-10-11 MED ORDER — PRAZOSIN HCL 1 MG PO CAPS
1.0000 mg | ORAL_CAPSULE | Freq: Every day | ORAL | Status: DC
  Administered 2013-10-11 – 2013-10-14 (×4): 1 mg via ORAL
  Filled 2013-10-11: qty 14
  Filled 2013-10-11 (×5): qty 1

## 2013-10-11 MED ORDER — BUPRENORPHINE HCL 2 MG SL SUBL: 2.0000 mg | SUBLINGUAL_TABLET | Freq: Once | SUBLINGUAL | Status: DC

## 2013-10-11 MED ORDER — QUETIAPINE FUMARATE 100 MG PO TABS
100.0000 mg | ORAL_TABLET | Freq: Every day | ORAL | Status: DC
  Administered 2013-10-11 – 2013-10-12 (×2): 100 mg via ORAL
  Filled 2013-10-11 (×4): qty 1

## 2013-10-11 MED ORDER — LORAZEPAM 1 MG PO TABS
1.0000 mg | ORAL_TABLET | Freq: Three times a day (TID) | ORAL | Status: DC | PRN
  Administered 2013-10-11 – 2013-10-14 (×6): 1 mg via ORAL
  Filled 2013-10-11 (×6): qty 1

## 2013-10-11 NOTE — Clinical Social Work Note (Signed)
Pt referral sent to [facility]. Pt also completed [facility] intake form. CSW faxed to number provided by pt.   The Sherwin-WilliamsHeather Smart, LCSWA 10/11/2013 2:47 PM

## 2013-10-11 NOTE — BHH Group Notes (Signed)
0900 nursing orientation group   The focus of this group is to educate the patient on the purpose and policies of crisis stabilization and provide a format to answer questions about their admission.  The group details unit policies and expectations of patients while admitted.  Pt left before group was started and went back to his room.

## 2013-10-11 NOTE — Clinical Social Work Note (Signed)
[facility]. [facility] application/referral faxed to Maggie at [facility] today 11:00AM. [facility] [place]. [facility]. CSW left message for Maggie yesterday afternoon 10/10/13 at 3:30PM inquiring about bed availability for males. Call not returned at this time.   The Sherwin-WilliamsHeather Smart, LCSWA  10/11/2013 11:07 AM

## 2013-10-11 NOTE — Progress Notes (Signed)
D: Patient resting in bed with eyes closed.  Respirations even and unlabored.  Patient appears to be in no apparent distress. A: Staff to monitor Q 15 mins for safety.   R:Patient remains safe on the unit.  

## 2013-10-11 NOTE — Progress Notes (Signed)
Patient ID: Mason Jones, male   DOB: 11/11/1985, 28 y.o.   MRN: [id number] D: Pt is awake and active on the unit this PM. Pt denies SI/HI and A/V hallucinations. Pt mood and affect are anxious, but he is assertive and cooperative with staff.  Pt's most recent COWS score was 6. Pt did report finally having a normal BM this Afternoon. Pt also had questions about Suboxone therapy for detox. Pt c/o insomnia.  A: Writer discussed opiate withdrawal with pt and MD and answered questions. Encouraged pt to express needs with staff and administered medication per MD orders. Writer also encouraged pt to participate in groups.  R: Suboxone last dose moved forward to 1700 to prevent insomnia. Writer will continue to monitor. 15 minute checks are ongoing for safety.

## 2013-10-11 NOTE — Progress Notes (Signed)
[facility] MD Progress Note  10/11/2013 3:32 PM Mason Jones  MRN:  [id number] Subjective:  Still not feeling well. Admits to some anxiety, some mild tremor, some aches and pains. Being weaned off the Subutex. He is still trying to go to a residential treatment program. Admits he is scared of leaving without a structure. States he has no one he can count on. He is having difficulty with sleep. Admits to nightmares about the traumatic events  Diagnosis:   DSM5: Schizophrenia Disorders:  none Obsessive-Compulsive Disorders:  none Trauma-Stressor Disorders:  Posttraumatic Stress Disorder (309.81) Substance/Addictive Disorders:  Opioid Disorder - Severe (304.00) Depressive Disorders:  Major Depressive Disorder - Moderate (296.22) Total Time spent with patient: 30 minutes  Axis I: Generalized Anxiety Disorder  ADL's:  Intact  Sleep: Fair  Appetite:  Fair  Suicidal Ideation:  Plan:  denies Intent:  denies Means:  denies Homicidal Ideation:  Plan:  denies Intent:  denies Means:  denies AEB (as evidenced by):  Psychiatric Specialty Exam: Physical Exam  Review of Systems  Constitutional: Negative.   HENT: Negative.   Eyes: Negative.   Respiratory: Negative.   Cardiovascular: Negative.   Gastrointestinal: Negative.   Genitourinary: Negative.   Musculoskeletal: Positive for myalgias.  Skin: Negative.   Neurological: Negative.   Endo/Heme/Allergies: Negative.   Psychiatric/Behavioral: Positive for depression and substance abuse. The patient is nervous/anxious.     Blood pressure 147/63, pulse 78, temperature 97.6 F (36.4 C), temperature source Oral, resp. rate 18, height 5\' 10"  (1.778 m), weight 62.37 kg (137 lb 8 oz), SpO2 100.00%.Body mass index is 19.73 kg/(m^2).  General Appearance: Fairly Groomed  Patent attorneyye Contact::  Fair  Speech:  Clear and Coherent  Volume:  Normal  Mood:  Anxious and worried  Affect:  anxious, worried  Thought Process:  Coherent and Goal Directed  Orientation:   Full (Time, Place, and Person)  Thought Content:  symptoms worries concerns  Suicidal Thoughts:  No  Homicidal Thoughts:  No  Memory:  Immediate;   Fair Recent;   Fair Remote;   Fair  Judgement:  Fair  Insight:  Present  Psychomotor Activity:  Restlessness  Concentration:  Fair  Recall:  FiservFair  Fund of Knowledge:NA  Language: Fair  Akathisia:  No  Handed:    AIMS (if indicated):     Assets:  Desire for Improvement  Sleep:  Number of Hours: 5.75   Musculoskeletal: Strength & Muscle Tone: within normal limits Gait & Station: normal Patient leans: N/A  Current Medications: Current Facility-Administered Medications  Medication Dose Route Frequency Provider Last Rate Last Dose  . acetaminophen (TYLENOL) tablet 650 mg  650 mg Oral Q6H PRN Benjaman PottGerald D Taylor, MD   650 mg at 10/10/13 1033  . alum & mag hydroxide-simeth (MAALOX/MYLANTA) 200-200-20 MG/5ML suspension 30 mL  30 mL Oral Q4H PRN Benjaman PottGerald D Taylor, MD      . buprenorphine (SUBUTEX) SL tablet 2 mg  2 mg Sublingual BID Rachael FeeIrving A Varshini Arrants, MD   2 mg at 10/11/13 0846  . docusate sodium (COLACE) capsule 100 mg  100 mg Oral BID Rachael FeeIrving A Garrett Bowring, MD   100 mg at 10/11/13 0846  . feeding supplement (ENSURE COMPLETE) (ENSURE COMPLETE) liquid 237 mL  237 mL Oral TID BM Jeoffrey MassedLaura Lee Jobe, RD   237 mL at 10/11/13 0848  . LORazepam (ATIVAN) tablet 1 mg  1 mg Oral Q8H PRN Rachael FeeIrving A Linus Weckerly, MD   1 mg at 10/11/13 1156  . magnesium hydroxide (MILK OF MAGNESIA)  suspension 30 mL  30 mL Oral Daily PRN Rachael Fee, MD      . nicotine polacrilex (NICORETTE) gum 2 mg  2 mg Oral PRN Rachael Fee, MD   2 mg at 10/11/13 1321  . polyethylene glycol (MIRALAX / GLYCOLAX) packet 17 g  17 g Oral Daily Beau Fanny, FNP   17 g at 10/10/13 1950  . prazosin (MINIPRESS) capsule 1 mg  1 mg Oral QHS Rachael Fee, MD      . QUEtiapine (SEROQUEL) tablet 100 mg  100 mg Oral QHS Rachael Fee, MD      . sodium phosphate (FLEET) 7-19 GM/118ML enema 1 enema  1 enema Rectal Daily PRN  Beau Fanny, FNP      . venlafaxine XR (EFFEXOR-XR) 24 hr capsule 75 mg  75 mg Oral Q breakfast Rachael Fee, MD   75 mg at 10/11/13 8119    Lab Results: No results found for this or any previous visit (from the past 48 hour(s)).  Physical Findings: AIMS: Facial and Oral Movements Muscles of Facial Expression: None, normal Lips and Perioral Area: None, normal Jaw: None, normal Tongue: None, normal,Extremity Movements Upper (arms, wrists, hands, fingers): None, normal Lower (legs, knees, ankles, toes): None, normal, Trunk Movements Neck, shoulders, hips: None, normal, Overall Severity Severity of abnormal movements (highest score from questions above): None, normal Incapacitation due to abnormal movements: None, normal Patient's awareness of abnormal movements (rate only patient's report): No Awareness,    CIWA:  CIWA-Ar Total: 9 COWS:  COWS Total Score: 5  Treatment Plan Summary: Daily contact with patient to assess and evaluate symptoms and progress in treatment Medication management  Plan: Decrease the Subutex to 2 mg BID           Trial with Seroquel at HS/Prozosin 1 mg HS            Explore other rehab options  Medical Decision Making Problem Points:  Review of psycho-social stressors (1) Data Points:  Review of medication regiment & side effects (2) Review of new medications or change in dosage (2)  I certify that inpatient services furnished can reasonably be expected to improve the patient's condition.   Zamzam Whinery A 10/11/2013, 3:32 PM

## 2013-10-12 MED ORDER — MIRTAZAPINE 30 MG PO TABS
30.0000 mg | ORAL_TABLET | Freq: Every day | ORAL | Status: DC
  Administered 2013-10-12 – 2013-10-14 (×3): 30 mg via ORAL
  Filled 2013-10-12: qty 14
  Filled 2013-10-12 (×5): qty 1

## 2013-10-12 MED ORDER — NICOTINE POLACRILEX 2 MG MT GUM
CHEWING_GUM | OROMUCOSAL | Status: AC
  Administered 2013-10-12: 17:00:00
  Filled 2013-10-12: qty 1

## 2013-10-12 NOTE — [facility] Group Notes (Signed)
[facility]   10/12/2013 9:45 AM  Participation Quality:  Appropriate   Mood/Affect:  Appropriate  Depression Rating:   0  Anxiety Rating:  0  Thoughts of Suicide:  No Will you contract for safety?   NA  Current AVH:  No  Plan for Discharge/Comments:  Pt reports minimal withdrawal symptoms with pleasant mood and calm affect. PT states that [facility] is [facility] reviewing pt [facility] today. Pt working with [facility] to complete admission packet today.   Transportation Means: bus pass  Supports: no family/friend supports reported.   Smart, American FinancialHeather LCSWA

## 2013-10-12 NOTE — Tx Team (Signed)
Interdisciplinary Treatment Plan Update (Adult)  Date: 10/12/2013   Time Reviewed: 10:55 AM  Progress in Treatment:  Attending groups: Yes  Participating in groups:  Yes  Taking medication as prescribed: Yes  Tolerating medication: Yes  Family/Significant othe contact made: Pt refuses to consent to family contact. SPE completed with pt.  Patient understands diagnosis: Yes, AEB seeking treatment for opioid detox, SI, mood stabilization, and med management.  Discussing patient identified problems/goals with staff: Yes  Medical problems stabilized or resolved: Yes  Denies suicidal/homicidal ideation: Yes, self report.  Patient has not harmed self or Others: Yes  New problem(s) identified:  Discharge Plan or Barriers: [facility] CSW provided pt with several [place] treatment/sobriety houses per his request.[facility] Pt applications pending at both [facility] and [facility]. Pt also given [place] house list for [place].  Additional comments: n/a  Reason for Continuation of Hospitalization: subutex-withdrawals Med management  Estimated length of stay: 2-4 days (tentative d/c Tuesday) For review of initial/current patient goals, please see plan of care.  Attendees:  Patient:    Family:    Physician: Dr. Dub MikesLugo MD  10/12/2013 10:55 AM   Nursing: Vanessa KickJan RN  10/12/2013 10:55 AM   Clinical Social Worker Sama Arauz Smart, LCSWA  10/12/2013 10:55 AM   Other: Henrene PastorJanet Rn 10/12/2013 10:55 AM   Other: Lowanda FosterBrittany RN 10/12/2013 10:55 AM   Other: Darden DatesJennifer C. Nurse CM 10/12/2013 10:55 AM   Other:    Scribe for Treatment Team:  [employer] Sherwin-WilliamsHeather Smart LCSWA 10/12/2013 10:55 AM

## 2013-10-12 NOTE — Progress Notes (Signed)
D: Patient denies SI/HI and A/V hallucinations  A: Monitored q 15 minutes; patient encouraged to attend groups; patient educated about medications; patient given medications per physician orders; patient encouraged to express feelings and/or concerns  R: Patient is cooperative and appropriate to circumstances; patient's interaction with staff and peers is appropriate; patient was able to set goal to talk with staff 1:1 when having feelings of SI; patient is taking medications as prescribed and tolerating medications

## 2013-10-12 NOTE — [facility] Group Notes (Signed)
[facility] LCSW Group Therapy  10/12/2013  1:15 PM   Type of Therapy:  Group Therapy  Participation Level:  Active  Participation Quality:  Attentive, Sharing and Supportive  Affect:  Depressed and Flat  Cognitive:  Alert and Oriented  Insight:  Developing/Improving and Engaged  Engagement in Therapy:  Developing/Improving and Engaged  Modes of Intervention:  Clarification, Confrontation, Discussion, Education, Exploration, Limit-setting, Orientation, Problem-solving, Rapport Building, Dance movement psychotherapisteality Testing, Socialization and Support  Summary of Progress/Problems: The topic for today was feelings about relapse.  Pt discussed what relapse prevention is to them and identified triggers that they are on the path to relapse.  Pt processed their feeling towards relapse and was able to relate to peers.  Pt discussed coping skills that can be used for relapse prevention.  Pt processed what led to this admission, discussing being clean for 90 days before relapsing.  Pt discussed feelings of regret for relapsing and how it is taking a lot for him to get and stay clean, as he's been going through this for 10 years now.  Pt states that he tries to stay positive and not focus on his mistakes.  Pt actively participated and was engaged in group discussion.   Mason IvanChelsea Horton, LCSW 10/12/2013  2:24 PM

## 2013-10-12 NOTE — Progress Notes (Signed)
Pt attended the evening AA speaker meeting. 

## 2013-10-12 NOTE — Progress Notes (Signed)
[facility]  10/12/2013 3:20 PM Mason HeirJames Nealy  MRN:  [id number] Subjective:  States he is starting to feel more hopeful. He did sleep better last night. He is exploring several options in terms of rehab. Continues to state he is concerned about not having further treatment a structured place to continue to work on his issues. He has no support out there. States he is truly alone and that he has to build himself up. Concerned if he is going to be able to do it.  Diagnosis:   DSM5: Schizophrenia Disorders:  none Obsessive-Compulsive Disorders:  none Trauma-Stressor Disorders:  PTSD Substance/Addictive Disorders:  Opioid Disorder - Severe (304.00) Depressive Disorders:  Major Depressive Disorder - Moderate (296.22) Total Time spent with patient: 45 minutes  Axis I: Substance Induced Mood Disorder  GAD  ADL's:  Intact  Sleep: Fair  Appetite:  Fair  Suicidal Ideation:  Plan:  denies Intent:  denies Means:  denies Homicidal Ideation:  Plan:  denies Intent:  denies Means:  denies AEB (as evidenced by):  Psychiatric Specialty Exam: Physical Exam  Review of Systems  Constitutional: Negative.   HENT: Negative.   Eyes: Negative.   Respiratory: Negative.   Cardiovascular: Negative.   Gastrointestinal: Negative.   Genitourinary: Negative.   Musculoskeletal: Negative.   Skin: Negative.   Neurological: Negative.   Endo/Heme/Allergies: Negative.   Psychiatric/Behavioral: Positive for depression and substance abuse. The patient is nervous/anxious.     Blood pressure 111/45, pulse 86, temperature 97.8 F (36.6 C), temperature source Oral, resp. rate 20, height 5\' 10"  (1.778 m), weight 62.37 kg (137 lb 8 oz), SpO2 100.00%.Body mass index is 19.73 kg/(m^2).  General Appearance: Fairly Groomed  Patent attorneyye Contact::  Fair  Speech:  Clear and Coherent  Volume:  Normal  Mood:  Anxious and worried  Affect:  anxious, worried  Thought Process:  Coherent and Goal Directed  Orientation:   Full (Time, Place, and Person)  Thought Content:  symptoms, worries, concerns  Suicidal Thoughts:  No  Homicidal Thoughts:  No  Memory:  Immediate;   Fair Recent;   Fair Remote;   Fair  Judgement:  Fair  Insight:  Present  Psychomotor Activity:  Restlessness  Concentration:  Fair  Recall:  FiservFair  Fund of Knowledge:NA  Language: Fair  Akathisia:  No  Handed:    AIMS (if indicated):     Assets:  Desire for Improvement  Sleep:  Number of Hours: 5   Musculoskeletal: Strength & Muscle Tone: within normal limits Gait & Station: normal Patient leans: N/A  Current Medications: Current Facility-Administered Medications  Medication Dose Route Frequency Provider Last Rate Last Dose  . acetaminophen (TYLENOL) tablet 650 mg  650 mg Oral Q6H PRN Benjaman PottGerald D Taylor, MD   650 mg at 10/10/13 1033  . alum & mag hydroxide-simeth (MAALOX/MYLANTA) 200-200-20 MG/5ML suspension 30 mL  30 mL Oral Q4H PRN Benjaman PottGerald D Taylor, MD      . buprenorphine (SUBUTEX) SL tablet 2 mg  2 mg Sublingual Once Rachael FeeIrving A Keenya Matera, MD      . docusate sodium (COLACE) capsule 100 mg  100 mg Oral BID Rachael FeeIrving A Montgomery Rothlisberger, MD   100 mg at 10/11/13 1638  . feeding supplement (ENSURE COMPLETE) (ENSURE COMPLETE) liquid 237 mL  237 mL Oral TID BM Jeoffrey MassedLaura Lee Jobe, RD   237 mL at 10/12/13 1400  . LORazepam (ATIVAN) tablet 1 mg  1 mg Oral Q8H PRN Rachael FeeIrving A Rhilynn Preyer, MD   1 mg at 10/11/13  1156  . magnesium hydroxide (MILK OF MAGNESIA) suspension 30 mL  30 mL Oral Daily PRN Rachael Fee, MD      . nicotine polacrilex (NICORETTE) gum 2 mg  2 mg Oral PRN Rachael Fee, MD   2 mg at 10/12/13 1610  . polyethylene glycol (MIRALAX / GLYCOLAX) packet 17 g  17 g Oral Daily Beau Fanny, FNP   17 g at 10/10/13 1950  . prazosin (MINIPRESS) capsule 1 mg  1 mg Oral QHS Rachael Fee, MD   1 mg at 10/11/13 2149  . QUEtiapine (SEROQUEL) tablet 100 mg  100 mg Oral QHS Rachael Fee, MD   100 mg at 10/11/13 2149  . sodium phosphate (FLEET) 7-19 GM/118ML enema 1 enema  1  enema Rectal Daily PRN Beau Fanny, FNP      . venlafaxine XR (EFFEXOR-XR) 24 hr capsule 75 mg  75 mg Oral Q breakfast Rachael Fee, MD   75 mg at 10/12/13 9604    Lab Results: No results found for this or any previous visit (from the past 48 hour(s)).  Physical Findings: AIMS: Facial and Oral Movements Muscles of Facial Expression: None, normal Lips and Perioral Area: None, normal Jaw: None, normal Tongue: None, normal,Extremity Movements Upper (arms, wrists, hands, fingers): None, normal Lower (legs, knees, ankles, toes): None, normal, Trunk Movements Neck, shoulders, hips: None, normal, Overall Severity Severity of abnormal movements (highest score from questions above): None, normal Incapacitation due to abnormal movements: None, normal Patient's awareness of abnormal movements (rate only patient's report): No Awareness,    CIWA:  CIWA-Ar Total: 9 COWS:  COWS Total Score: 1  Treatment Plan Summary: Daily contact with patient to assess and evaluate symptoms and progress in treatment Medication management  Plan: Supportive approach/coping skills/relapse prevention           D/C the Subutex           Continue to work on the co morbidities; optimize response to medications  Medical Decision Making Problem Points:  Review of psycho-social stressors (1) Data Points:  Review of new medications or change in dosage (2)  I certify that inpatient services furnished can reasonably be expected to improve the patient's condition.   Zinedine Ellner A 10/12/2013, 3:20 PM

## 2013-10-12 NOTE — Progress Notes (Signed)
D: Patient resting in bed with eyes closed.  Respirations even and unlabored.  Patient appears to be in no apparent distress. A: Staff to monitor Q 15 mins for safety.   R:Patient remains safe on the unit.  

## 2013-10-13 DIAGNOSIS — F411 Generalized anxiety disorder: Secondary | ICD-10-CM

## 2013-10-13 MED ORDER — ATOMOXETINE HCL 40 MG PO CAPS
40.0000 mg | ORAL_CAPSULE | Freq: Every day | ORAL | Status: DC
  Administered 2013-10-13 – 2013-10-15 (×3): 40 mg via ORAL
  Filled 2013-10-13: qty 1
  Filled 2013-10-13: qty 14
  Filled 2013-10-13 (×3): qty 1

## 2013-10-13 MED ORDER — QUETIAPINE FUMARATE 100 MG PO TABS: 100.0000 mg | ORAL_TABLET | Freq: Two times a day (BID) | ORAL | Status: DC

## 2013-10-13 MED ORDER — QUETIAPINE FUMARATE 100 MG PO TABS
100.0000 mg | ORAL_TABLET | Freq: Two times a day (BID) | ORAL | Status: DC
  Administered 2013-10-13 – 2013-10-15 (×6): 100 mg via ORAL
  Filled 2013-10-13 (×2): qty 28
  Filled 2013-10-13 (×7): qty 1

## 2013-10-13 NOTE — Progress Notes (Signed)
Patient ID: Mason Jones, male   DOB: 02/10/1986, 28 y.o.   MRN: [id number] D)  Came to med window not long after shift started, feeling anxious, but was reminded that he had just been given ativan at the end of the previous shift, was encouraged to lie down and try to let the med begin to work.Has been pleasant and cooperative in spite of his anxiety, attended the AA group this evening.   A0  Will continue to monitor for safety, continue POC R)  Safety maintained.

## 2013-10-13 NOTE — [facility] Group Notes (Signed)
[facility] Group Notes:  Coping skills  Date:  10/13/2013  Time:  2:27 PM  Type of Therapy:  Nurse Education  Participation Level:  Active  Participation Quality:  Appropriate  Affect:  Appropriate  Cognitive:  Alert  Insight:  Appropriate  Engagement in Group:  Engaged  Modes of Intervention:  Discussion  Summary of Progress/Problems:  Mason Jones, Mason Jones 10/13/2013, 2:27 PM

## 2013-10-13 NOTE — Progress Notes (Signed)
Patient ID: Mason Jones, male   DOB: 1985-08-24, 27 y.o.   MRN: [id number] [facility] MD Progress Note  10/13/2013 2:02 PM Mason Jones  MRN:  [id number]  Subjective:  [name] says he is not feeling well today. Is complaining of feeling very anxious and edgy. Says he feels like biting someone's head off. Complains of agitation and irritability. Is endorsing inability to focus and or concentrate. Blames it on his problems with ADHD.  Denies any SIHI.  Diagnosis:   DSM5: Schizophrenia Disorders:  none Obsessive-Compulsive Disorders:  none Trauma-Stressor Disorders:  Posttraumatic Stress Disorder (309.81) Substance/Addictive Disorders:  Opioid Disorder - Severe (304.00) Depressive Disorders:  Major Depressive Disorder - Moderate (296.22) Total Time spent with patient: 30 minutes  Axis I: Generalized Anxiety Disorder  ADL's:  Intact  Sleep: Fair  Appetite:  Fair  Suicidal Ideation:  Plan:  denies Intent:  denies Means:  denies Homicidal Ideation:  Plan:  denies Intent:  denies Means:  denies AEB (as evidenced by):  Psychiatric Specialty Exam: Physical Exam  Review of Systems  Constitutional: Negative.   HENT: Negative.   Eyes: Negative.   Respiratory: Negative.   Cardiovascular: Negative.   Gastrointestinal: Negative.   Genitourinary: Negative.   Musculoskeletal: Positive for myalgias.  Skin: Negative.   Neurological: Negative.   Endo/Heme/Allergies: Negative.   Psychiatric/Behavioral: Positive for depression and substance abuse. The patient is nervous/anxious.     Blood pressure 143/79, pulse 67, temperature 98.4 F (36.9 C), temperature source Oral, resp. rate 20, height 5\' 10"  (1.778 m), weight 62.37 kg (137 lb 8 oz), SpO2 100.00%.Body mass index is 19.73 kg/(m^2).  General Appearance: Fairly Groomed  Patent attorney::  Fair  Speech:  Clear and Coherent  Volume:  Normal  Mood:  Anxious and worried  Affect:  anxious, worried  Thought Process:  Coherent and Goal Directed   Orientation:  Full (Time, Place, and Person)  Thought Content:  symptoms worries concerns  Suicidal Thoughts:  No  Homicidal Thoughts:  No  Memory:  Immediate;   Fair Recent;   Fair Remote;   Fair  Judgement:  Fair  Insight:  Present  Psychomotor Activity:  Restlessness, agitation, irritability  Concentration:  Fair  Recall:  [employer] of Knowledge:NA  Language: Fair  Akathisia:  No  Handed:    AIMS (if indicated):     Assets:  Desire for Improvement  Sleep:  Number of Hours: 5   Musculoskeletal: Strength & Muscle Tone: within normal limits Gait & Station: normal Patient leans: N/A  Current Medications: Current Facility-Administered Medications  Medication Dose Route Frequency Provider Last Rate Last Dose  . acetaminophen (TYLENOL) tablet 650 mg  650 mg Oral Q6H PRN Benjaman Pott, MD   650 mg at 10/10/13 1033  . alum & mag hydroxide-simeth (MAALOX/MYLANTA) 200-200-20 MG/5ML suspension 30 mL  30 mL Oral Q4H PRN Benjaman Pott, MD   30 mL at 10/12/13 1628  . atomoxetine (STRATTERA) capsule 40 mg  40 mg Oral Daily Sanjuana Kava, NP      . docusate sodium (COLACE) capsule 100 mg  100 mg Oral BID Rachael Fee, MD   100 mg at 10/11/13 1638  . feeding supplement (ENSURE COMPLETE) (ENSURE COMPLETE) liquid 237 mL  237 mL Oral TID BM Jeoffrey Massed, RD   237 mL at 10/13/13 1122  . LORazepam (ATIVAN) tablet 1 mg  1 mg Oral Q8H PRN Rachael Fee, MD   1 mg at 10/13/13 0326  . magnesium hydroxide (  MILK OF MAGNESIA) suspension 30 mL  30 mL Oral Daily PRN Rachael FeeIrving A Lugo, MD      . mirtazapine (REMERON) tablet 30 mg  30 mg Oral QHS Rachael FeeIrving A Lugo, MD   30 mg at 10/12/13 2124  . nicotine polacrilex (NICORETTE) gum 2 mg  2 mg Oral PRN Rachael FeeIrving A Lugo, MD   2 mg at 10/13/13 0327  . polyethylene glycol (MIRALAX / GLYCOLAX) packet 17 g  17 g Oral Daily Beau FannyJohn C Withrow, FNP   17 g at 10/10/13 1950  . prazosin (MINIPRESS) capsule 1 mg  1 mg Oral QHS Rachael FeeIrving A Lugo, MD   1 mg at 10/12/13 2124  .  QUEtiapine (SEROQUEL) tablet 100 mg  100 mg Oral BID Sanjuana KavaAgnes I Masayoshi Couzens, NP      . sodium phosphate (FLEET) 7-19 GM/118ML enema 1 enema  1 enema Rectal Daily PRN Beau FannyJohn C Withrow, FNP      . venlafaxine XR (EFFEXOR-XR) 24 hr capsule 75 mg  75 mg Oral Q breakfast Rachael FeeIrving A Lugo, MD   75 mg at 10/13/13 0805    Lab Results: No results found for this or any previous visit (from the past 48 hour(s)).  Physical Findings: AIMS: Facial and Oral Movements Muscles of Facial Expression: None, normal Lips and Perioral Area: None, normal Jaw: None, normal Tongue: None, normal,Extremity Movements Upper (arms, wrists, hands, fingers): None, normal Lower (legs, knees, ankles, toes): None, normal, Trunk Movements Neck, shoulders, hips: None, normal, Overall Severity Severity of abnormal movements (highest score from questions above): None, normal Incapacitation due to abnormal movements: None, normal Patient's awareness of abnormal movements (rate only patient's report): No Awareness,    CIWA:  CIWA-Ar Total: 9 COWS:  COWS Total Score: 1  Treatment Plan Summary: Daily contact with patient to assess and evaluate symptoms and progress in treatment Medication management  Plan: Decrease the Subutex to 2 mg BID Increased Seroquel 100 mg to bid, add Straterra 40 mg daily for ADHD symptoms . Continue Prozosin 1 mg HS Explore other rehab options  Medical Decision Making Problem Points:  Review of psycho-social stressors (1) Data Points:  Review of medication regiment & side effects (2) Review of new medications or change in dosage (2)  I certify that inpatient services furnished can reasonably be expected to improve the patient's condition.   Armandina Stammerwoko, Lopez Dentinger I, PMHNP-BC 10/13/2013, 2:02 PM

## 2013-10-13 NOTE — Progress Notes (Signed)
Adult Psychoeducational Group Note  Date:  10/13/2013 Time:  10:48 AM  Group Topic/Focus:  Identifying Needs:   The focus of this group is to help patients identify their personal needs that have been historically problematic and identify healthy behaviors to address their needs.  Participation Level:  Did Not Attend   Additional Comments:  Pt was allowed to sleep during the morning group.  Gwyndolyn KaufmanGrace, Meadow Abramo F 10/13/2013, 10:48 AM

## 2013-10-13 NOTE — Progress Notes (Signed)
  D) Patient minimally cooperative upon my assessment. Patient resting quietly in bed throughout morning, encouraged him to go to groups when able.  Patient denies SI/HI, denies A/V hallucinations. Patient c/o anxiety, offered education re coping skills and notified patient of PRN availability, patient verbalizes understanding.   A) Patient offered support and encouragement, patient encouraged to discuss feelings/concerns with staff. Patient verbalized understanding. Patient monitored Q15 minutes for safety. Patient met with MD  to discuss today's goals and plan of care.  R) Patient isolative to room at times, attending  meals in dining room. Patient appropriate with staff and peers.   Patient taking medications as ordered. Will continue to monitor.

## 2013-10-13 NOTE — Progress Notes (Signed)
Pt requested to sign 72 hr discharge. Writer explained what signing it means, pt stated he would be leaving in 3 days anyway so he decided against it.

## 2013-10-13 NOTE — Progress Notes (Signed)
At 2200, the Writer observed the Pt enter into a male Pt's room. The Writer went down the hall to investigate and found Mason Jones in the male Pt's bed moving around under the covers. At this time, the lights were turned on and Mason Jones was asked to leave the male Pt's room. He was still dressed upon leaving but his pants zipper was undone. Both Pt's were reminded that this was a violation of their treatment agreement and were instructed not to engage in sexual activity again or enter into other patient's rooms for the remainder of their stay here. Mason Jones replied: "I really don't care."

## 2013-10-13 NOTE — Progress Notes (Signed)
[facility] Group Notes:  (Nursing/MHT/Case Management/Adjunct)  Date:  10/13/2013  Time:  6:28 PM  Type of Therapy:  Psychoeducational Skills  Participation Level:  Active  Participation Quality:  Appropriate and Attentive  Affect:  Appropriate  Cognitive:  Appropriate  Insight:  Appropriate and Good  Engagement in Group:  Engaged and Supportive  Modes of Intervention:  Activity  Summary of Progress/Problems: Pts played a game of Pictionary using coping skills.   Jean Alejos C 10/13/2013, 6:28 PM 

## 2013-10-13 NOTE — [facility] Group Notes (Signed)
[facility] Group Notes: (Clinical Social Work)   10/13/2013      Type of Therapy:  Group Therapy   Participation Level:  Did Not Attend    Mason MantleMareida Grossman-Orr, LCSW 10/13/2013, 11:16 AM

## 2013-10-13 NOTE — Progress Notes (Signed)
Pt appeared very calm but stated he needed ativan to calm his nerves. Pt is cooperative and appears to be med seeking.

## 2013-10-13 NOTE — Plan of Care (Signed)
Problem: Ineffective individual coping Goal: STG: Patient will remain free from self harm Outcome: Progressing Patient denying SI and has not engaged in self harm.  Goal: STG:Pt. will utilize relaxation techniques to reduce stress STG: Patient will utilize relaxation techniques to reduce stress levels  Outcome: Not Progressing Patient continues to request prn medications to alleviate his anxiety and agitation. Spoke with patient about the importance of strong coping skills in conjunction with meds however patient not receptive.

## 2013-10-14 MED ORDER — OLANZAPINE 5 MG PO TBDP
5.0000 mg | ORAL_TABLET | Freq: Once | ORAL | Status: AC
  Administered 2013-10-14: 5 mg via ORAL
  Filled 2013-10-14 (×2): qty 1

## 2013-10-14 MED ORDER — LORAZEPAM 1 MG PO TABS
1.0000 mg | ORAL_TABLET | Freq: Four times a day (QID) | ORAL | Status: DC | PRN
  Administered 2013-10-14 – 2013-10-15 (×3): 1 mg via ORAL
  Filled 2013-10-14 (×3): qty 1

## 2013-10-14 MED ORDER — OLANZAPINE 5 MG PO TBDP
5.0000 mg | ORAL_TABLET | Freq: Three times a day (TID) | ORAL | Status: DC | PRN
  Administered 2013-10-14: 5 mg via ORAL
  Filled 2013-10-14: qty 1

## 2013-10-14 MED ORDER — OLANZAPINE 10 MG PO TBDP
10.0000 mg | ORAL_TABLET | Freq: Three times a day (TID) | ORAL | Status: DC | PRN
  Administered 2013-10-15: 10 mg via ORAL
  Filled 2013-10-14: qty 1

## 2013-10-14 NOTE — Progress Notes (Signed)
Patient did not attend the evening speaker AA meeting. Pt was notified that group was beginning but remained in bed.   

## 2013-10-14 NOTE — Progress Notes (Signed)
D.  Pt. Denies SI/HI and denies A/V hallucinations.  Pt. Has been in bed most of the evening reporting  that he did not feel well.   A.  Pt. Encouraged to attend group. R.  Pt. Reports that he did not feel well and did not attend group.

## 2013-10-14 NOTE — [facility] Group Notes (Signed)
[facility] Group Notes: healthy support systems  Date:  10/14/2013  Time:  0900  Type of Therapy:  Nurse Education  Participation Level:  Did Not Attend  Participation Quality:  Inattentive  Affect:  Flat  Cognitive:  Lacking  Insight:  None  Engagement in Group:  None  Modes of Intervention:  Discussion  Summary of Progress/Problems:pt did not attend  Rodman KeyWebb, Dayonna Selbe [facility] 10/14/2013, 10:51 AM

## 2013-10-14 NOTE — Progress Notes (Signed)
Patient anxious, agitated and irritable this evening. He is denying SI/HI and remains flat in affect. States the prn ativan helped initially but then wore off. Continues to ask for additional medications, specifically more ativan - "at another hospital I was given 4mg  of ativan and that really helped."  States he hasn't been sleeping well however is asking for strattera to be given at this time. Medications reviewed with patient and encouraged pt to strengthen his coping skills. Explained why giving him larger doses of ativan or giving it more frequently is counter productive. Pt not receptive to information or suggestions and states, "I feel like taking someone's head off. I don't want to hurt anybody." Told patient that would not be tolerated and asked MHT on hall to monitor patient closely. A short time later, patient was observed going into male peer's room (see MHT note). Will continue to monitor closely. Mason Jones, Mason Jones

## 2013-10-14 NOTE — Progress Notes (Signed)
Patient ID: Mason Jones, male   DOB: 04/12/1985, 28 y.o.   MRN: [id number] D: Patient continues to ask for medications not ordered, or he asks for them too early.  He requested his ativan early and I explained to him that it was every 8 hours and it was not time for it.  He also asked for straterra at noon which he received at 0800.  He is upset because the dose is not high enough.  He requested to leave first thing this morning.  He reports continuing severe anxiety. He denies any depressive symptoms or hopelessness.  He denies any withdrawal symptoms.  He denies SI/HI/AVH.  A: Continue to monitor medication management and MD orders.  Safety checks completed every 15 minutes per protocol.  R: patient requesting to leave.

## 2013-10-14 NOTE — Progress Notes (Signed)
Adult Activity Group Note  Date: 10/14/13 Time: 3:00PM  Group Topic: Wellness Jeopardy  Participation Level: Did not attend  Activity: Patients participated in jeopardy like game, answering questions relating to wellness in categories such as Fruits, Fitness, Veggies, Exercise, and Producer, television/film/videoood Safety.  Additional Comments: Pt was in the hallway during group and did not attend.  Everrett Coombewen, Paulyne Mooty C 4:15 PM

## 2013-10-14 NOTE — Progress Notes (Signed)
Patient ID: Mason Jones Peddy, male   DOB: 06/28/1985, 28 y.o.   MRN: [id number] [facility]  10/14/2013 10:30 AM Mason Jones Mason Jones  MRN:  [id number]  Subjective:  Pt seen and chart reviewed. Pt denies SI, HI, and AVH, contracts for safety. Pt reports that he has very high anxiety, rating it 8/10. Pt reports depression is about the same at 5/10. Pt states that his anxiety is "horrible and Vistaril does nothing for me". Pt is asking for medication changes. Pt reports poor sleep and poor appetite as well. Pt found sleeping in bed when others went to lunch. Pt decided to go eat with other patients at this time.  Diagnosis:   DSM5: Schizophrenia Disorders:  none Obsessive-Compulsive Disorders:  none Trauma-Stressor Disorders:  Posttraumatic Stress Disorder (309.81) Substance/Addictive Disorders:  Opioid Disorder - Severe (304.00) Depressive Disorders:  Major Depressive Disorder - Moderate (296.22) Total Time spent with patient: 30 minutes  Axis I: Generalized Anxiety Disorder  ADL's:  Intact  Sleep: Fair  Appetite:  Fair  Suicidal Ideation:  Plan:  denies Intent:  denies Means:  denies Homicidal Ideation:  Plan:  denies Intent:  denies Means:  denies AEB (as evidenced by):  Psychiatric Specialty Exam: Physical Exam  Review of Systems  Constitutional: Negative.   HENT: Negative.   Eyes: Negative.   Respiratory: Negative.   Cardiovascular: Negative.   Gastrointestinal: Negative.   Genitourinary: Negative.   Musculoskeletal: Positive for myalgias.  Skin: Negative.   Neurological: Negative.   Endo/Heme/Allergies: Negative.   Psychiatric/Behavioral: Positive for depression and substance abuse. The patient is nervous/anxious.     Blood pressure 134/68, pulse 93, temperature 97.7 F (36.5 C), temperature source Oral, resp. rate 16, height 5\' 10"  (1.778 m), weight 62.37 kg (137 lb 8 oz), SpO2 100.00%.Body mass index is 19.73 kg/(m^2).  General Appearance: Fairly Groomed  Proofreaderye  Contact::  Fair  Speech:  Clear and Coherent  Volume:  Normal  Mood:  Anxious and worried  Affect:  anxious, worried  Thought Process:  Coherent and Goal Directed  Orientation:  Full (Time, Place, and Person)  Thought Content:  symptoms worries concerns  Suicidal Thoughts:  No  Homicidal Thoughts:  No  Memory:  Immediate;   Fair Recent;   Fair Remote;   Fair  Judgement:  Fair  Insight:  Present  Psychomotor Activity:  Restlessness, agitation, irritability  Concentration:  Fair  Recall:  FiservFair  Fund of Knowledge:NA  Language: Fair  Akathisia:  No  Handed:    AIMS (if indicated):     Assets:  Desire for Improvement  Sleep:  Number of Hours: 5.25   Musculoskeletal: Strength & Muscle Tone: within normal limits Gait & Station: normal Patient leans: N/A  Current Medications: Current Facility-Administered Medications  Medication Dose Route Frequency Provider Last Rate Last Dose  . acetaminophen (TYLENOL) tablet 650 mg  650 mg Oral Q6H PRN Benjaman PottGerald D Taylor, MD   650 mg at 10/10/13 1033  . alum & mag hydroxide-simeth (MAALOX/MYLANTA) 200-200-20 MG/5ML suspension 30 mL  30 mL Oral Q4H PRN Benjaman PottGerald D Taylor, MD   30 mL at 10/12/13 1628  . atomoxetine (STRATTERA) capsule 40 mg  40 mg Oral Daily Sanjuana KavaAgnes I Nwoko, NP   40 mg at 10/14/13 0751  . docusate sodium (COLACE) capsule 100 mg  100 mg Oral BID Rachael FeeIrving A Lugo, MD   100 mg at 10/11/13 1638  . feeding supplement (ENSURE COMPLETE) (ENSURE COMPLETE) liquid 237 mL  237 mL Oral TID BM Vernona RiegerLaura  Debbrah Alar, RD   237 mL at 10/13/13 2145  . LORazepam (ATIVAN) tablet 1 mg  1 mg Oral Q8H PRN Rachael Fee, MD   1 mg at 10/14/13 0521  . magnesium hydroxide (MILK OF MAGNESIA) suspension 30 mL  30 mL Oral Daily PRN Rachael Fee, MD      . mirtazapine (REMERON) tablet 30 mg  30 mg Oral QHS Rachael Fee, MD   30 mg at 10/13/13 2146  . nicotine polacrilex (NICORETTE) gum 2 mg  2 mg Oral PRN Rachael Fee, MD   2 mg at 10/14/13 0755  . polyethylene glycol  (MIRALAX / GLYCOLAX) packet 17 g  17 g Oral Daily Beau Fanny, FNP   17 g at 10/10/13 1950  . prazosin (MINIPRESS) capsule 1 mg  1 mg Oral QHS Rachael Fee, MD   1 mg at 10/13/13 2146  . QUEtiapine (SEROQUEL) tablet 100 mg  100 mg Oral BID Sanjuana Kava, NP   100 mg at 10/14/13 0751  . sodium phosphate (FLEET) 7-19 GM/118ML enema 1 enema  1 enema Rectal Daily PRN Beau Fanny, FNP      . venlafaxine XR (EFFEXOR-XR) 24 hr capsule 75 mg  75 mg Oral Q breakfast Rachael Fee, MD   75 mg at 10/14/13 1610    Lab Results: No results found for this or any previous visit (from the past 48 hour(s)).  Physical Findings: AIMS: Facial and Oral Movements Muscles of Facial Expression: None, normal Lips and Perioral Area: None, normal Jaw: None, normal Tongue: None, normal,Extremity Movements Upper (arms, wrists, hands, fingers): None, normal Lower (legs, knees, ankles, toes): None, normal, Trunk Movements Neck, shoulders, hips: None, normal, Overall Severity Severity of abnormal movements (highest score from questions above): None, normal Incapacitation due to abnormal movements: None, normal Patient's awareness of abnormal movements (rate only patient's report): No Awareness,    CIWA:  CIWA-Ar Total: 9 COWS:  COWS Total Score: 1  Treatment Plan Summary: Daily contact with patient to assess and evaluate symptoms and progress in treatment Medication management  Plan:  Increased Seroquel 100 mg to bid, add Straterra 40 mg daily for ADHD symptoms . Continue Prozosin 1 mg HS Explore other rehab options -Change Ativan to q6h PRN (was q8h) for anxiety -Add Zyprexa 5mg  PO q8h PRN agitation -Trazodone 50mg  qhs PRN insomnia  Medical Decision Making Problem Points:  Review of psycho-social stressors (1) Data Points:  Review of medication regiment & side effects (2) Review of new medications or change in dosage (2)  I certify that inpatient services furnished can reasonably be expected to  improve the patient's condition.   Beau Fanny, FNP-BC 10/14/2013, 10:30 AM   Patient discussed with staff, and seen with this NP. Patient anxious, irritable, and impulsive. He is denying SI or HI. Does respond to support and review of stressors, encouragement . He has a long history of opiate dependence, and seems to be craving very significantly, but on the other hand, states he is motivated in going to ongoing inpatient longer term rehab. Dr. Dub Mikes, who is his established psychiatrist,  will see tomorrow. On Zyprexa PRNs , and is on Ativan Q 6 hours for anxiety.

## 2013-10-14 NOTE — [facility] Group Notes (Signed)
[facility] Group Notes: (Clinical Social Work)   10/14/2013      Type of Therapy:  Group Therapy   Participation Level:  Did Not Attend - Was in group when it started, looked angry, and quickly left    Ambrose MantleMareida Grossman-Orr, LCSW 10/14/2013, 12:22 PM

## 2013-10-14 NOTE — [facility] Group Notes (Signed)
[facility] Group Notes:Goals group  Date:  10/14/2013  Time:  2:09 PM  Type of Therapy:  Nurse Education  Participation Level:  Did Not Attend  Participation Quality:  Inattentive  Affect:  Flat  Cognitive:  Lacking  Insight:  None  Engagement in Group:  Lacking  Modes of Intervention:  Discussion  Summary of Progress/Problems:Pt did not attend  Rodman KeyWebb, Cari Burgo [facility] 10/14/2013, 2:09 PM

## 2013-10-15 DIAGNOSIS — F339 Major depressive disorder, recurrent, unspecified: Secondary | ICD-10-CM

## 2013-10-15 MED ORDER — ACETAMINOPHEN 325 MG PO TABS: 650.0000 mg | ORAL_TABLET | Freq: Four times a day (QID) | ORAL | Status: DC | PRN

## 2013-10-15 MED ORDER — VENLAFAXINE HCL ER 75 MG PO CP24: 75.0000 mg | ORAL_CAPSULE | Freq: Every day | ORAL | Status: DC

## 2013-10-15 MED ORDER — MAGNESIUM HYDROXIDE 400 MG/5ML PO SUSP: 30.0000 mL | Freq: Every day | ORAL | Status: DC | PRN

## 2013-10-15 MED ORDER — PRAZOSIN HCL 1 MG PO CAPS: 1.0000 mg | ORAL_CAPSULE | Freq: Every day | ORAL | Status: DC

## 2013-10-15 MED ORDER — DSS 100 MG PO CAPS: 100.0000 mg | ORAL_CAPSULE | Freq: Two times a day (BID) | ORAL | Status: DC

## 2013-10-15 MED ORDER — MIRTAZAPINE 30 MG PO TABS: 30.0000 mg | ORAL_TABLET | Freq: Every day | ORAL | Status: DC

## 2013-10-15 MED ORDER — QUETIAPINE FUMARATE 100 MG PO TABS: 100.0000 mg | ORAL_TABLET | Freq: Two times a day (BID) | ORAL | Status: DC

## 2013-10-15 MED ORDER — ALUM & MAG HYDROXIDE-SIMETH 200-200-20 MG/5ML PO SUSP: 30.0000 mL | ORAL | Status: DC | PRN

## 2013-10-15 MED ORDER — ATOMOXETINE HCL 40 MG PO CAPS: 40.0000 mg | ORAL_CAPSULE | Freq: Every day | ORAL | Status: DC

## 2013-10-15 NOTE — Progress Notes (Signed)
D:  Patient's self inventory sheet, patient slept good, needs sleep medication which is helpful.  Fair appetite, normal energy level, good concentration.  Denied depression, hopeless 1, anxiety 6.  Denied withdrawals.  Denied SI.  Denied physical problems.  Denied pain.  No pain medication needed.  Ready for treatment discharge.  Does have discharge plans.  No problems with medications after discharge. A:  Medications administered per MD orders.  Emotional support and encouragement given patient. R:  Denied SI and HI.  Denied A/V hallucinations.  Denied pain.  Safety continues with 15 minute checks.

## 2013-10-15 NOTE — [facility] Suicide Risk Assessment (Signed)
Suicide Risk Assessment  Discharge Assessment     Demographic Factors:  Male, Adolescent or young adult and Caucasian  Total Time spent with patient: 30 minutes  Psychiatric Specialty Exam:     Blood pressure 138/73, pulse 71, temperature 97.8 F (36.6 C), temperature source Oral, resp. rate 16, height 5\' 10"  (1.778 m), weight 62.37 kg (137 lb 8 oz), SpO2 100.00%.Body mass index is 19.73 kg/(m^2).  General Appearance: Fairly Groomed  Patent attorneyye Contact::  Fair  Speech:  Clear and Coherent  Volume:  Normal  Mood:  Euthymic  Affect:  Appropriate  Thought Process:  Coherent and Goal Directed  Orientation:  Full (Time, Place, and Person)  Thought Content:  plans as he moves on, relapse prevention plan  Suicidal Thoughts:  No  Homicidal Thoughts:  No  Memory:  Immediate;   Fair Recent;   Fair Remote;   Fair  Judgement:  Fair  Insight:  Present  Psychomotor Activity:  Normal  Concentration:  Fair  Recall:  FiservFair  Fund of Knowledge:NA  Language: Fair  Akathisia:  No  Handed:    AIMS (if indicated):     Assets:  Desire for Improvement  Sleep:  Number of Hours: 6.5    Musculoskeletal: Strength & Muscle Tone: within normal limits Gait & Station: normal Patient leans: N/A   Mental Status Per Nursing Assessment::   On Admission:     Current Mental Status by Physician: In full contact with reality. There are no active SI plans or intent. He is willing and motivated to pursue further outpatient treatment. [facility] [place] [facility] [place] [facility] [place] Meanwhile he is going to stay with a friend   Loss Factors: NA  Historical Factors: NA  Risk Reduction Factors:   NA  Continued Clinical Symptoms:  Depression:   Comorbid alcohol abuse/dependence Alcohol/Substance Abuse/Dependencies  Cognitive Features That Contribute To Risk:  Closed-mindedness Polarized thinking Thought constriction (tunnel vision)    Suicide Risk:  Minimal: No  identifiable suicidal ideation.  Patients presenting with no risk factors but with morbid ruminations; may be classified as minimal risk based on the severity of the depressive symptoms  Discharge Diagnoses:   AXIS I:  Opioid Dependence, S/P opioid withdrawal, PTSD, Major Depression, GAD AXIS II:  No diagnosis AXIS III:  History reviewed. No pertinent past medical history. AXIS IV:  other psychosocial or environmental problems AXIS V:  61-70 mild symptoms  Plan Of Care/Follow-up recommendations:  Activity:  as tolerated Diet:  regular Follow Up with [facility], or [facility] in [place] Is patient on multiple antipsychotic therapies at discharge:  No   Has Patient had three or more failed trials of antipsychotic monotherapy by history:  No  Recommended Plan for Multiple Antipsychotic Therapies: NA    Tytan Sandate A 10/15/2013, 4:21 PM

## 2013-10-15 NOTE — [facility] Group Notes (Signed)
[facility] LCSW Group Therapy  10/15/2013 3:17 PM  Type of Therapy:  Group Therapy  Participation Level:  Did Not Attend-pt in room resting.   Smart, Maraya Gwilliam LCSWA  10/15/2013, 3:17 PM

## 2013-10-15 NOTE — Progress Notes (Signed)
Adult Psychoeducational Group Note  Date:  10/15/2013 Time:  1:27 PM  Group Topic/Focus:  Self Care:   The focus of this group is to help patients understand the importance of self-care in order to improve or restore emotional, physical, spiritual, interpersonal, and financial health.  Participation Level:  Did Not Attend  Additional Comments:  Patient informed of group and encouraged to attend.  Coolidge Breezeichols, Sumaiyah Markert Roger 10/15/2013, 1:27 PM

## 2013-10-15 NOTE — [facility] Group Notes (Signed)
[facility]   10/15/2013 10:56 AM  Participation Quality:  Appropriate   Mood/Affect:  Appropriate  Depression Rating:  0  Anxiety Rating:  9  Thoughts of Suicide:  No Will you contract for safety?   NA  Current AVH:  No  Plan for Discharge/Comments:  Pt stated that he did something this weekend to try to leave but would not discuss in group. Pt stated that he called [facility] and has not heard back. Pt plans to stay with a friend until he can get into a program and [facility].   Transportation Means: Bus   Supports: none identified by pt   Smart, American FinancialHeather LCSWA
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
## 2013-10-15 NOTE — Progress Notes (Signed)
Patient ID: Urbano HeirJames Ritthaler, male   DOB: 09/20/1985, 28 y.o.   MRN: [id number] D)  Has been asleep tonight, has voiced no c/o's, eyes closed, resp reg, unlabored. A)  Will continue to monitor q 15 minutes for safety R)  Remains safe on unit.

## 2013-10-15 NOTE — Progress Notes (Signed)
Discharge Note:  Patient discharged with bus tickets.  Denied SI and HI.  Denied A/V hallucinations.  Patient stated he received all his belongings, clothing, hat, wallet, cell phones, medications, prescriptions, toiletries, misc items.  Suicide prevention information given and discussed with patient who stated he understood and had no questions.  Patient stated [facility].

## 2013-10-15 NOTE — Progress Notes (Signed)
[facility] Discharge [facility] :  Will you be returning to the same living situation after discharge: No. Living with friend until admission into [facility].  At discharge, do you have transportation home?:Yes,  bus or friend Do you have the ability to pay for your medications:Yes,  mental health  Release of information consent forms completed and submitted to Medical Records by CSW.  Patient to Follow up at: Follow-up Information   Follow up with [facility]. (Walk in between 8am-9am Monday through Friday for hospital follow-up/medication management/assessment for therapy services.)    Contact information:   [address] [place], [place] [address] Phone: [phone] Fax: [phone]      Follow up with [facility] (Check in with Maggie to check status of application at discharge. )    Contact information:   [address] [place], [place] [address] Phone: [phone] Fax: [phone]      Patient denies SI/HI:   Yes,  during group/self report.     Safety Planning and Suicide Prevention discussed:  Yes,  Pt refused to consent to family contact. SPE completed with pt and he was encouraged to share information with support network, ask questions, and talk about any concerns relating to SPE.  Smart, Shiloh Swopes LCSWA  10/15/2013, 11:06 AM

## 2013-10-17 NOTE — Progress Notes (Signed)
Patient Discharge Instructions:  After Visit Summary (AVS):   Faxed to:  10/17/13 Discharge Summary Note:   Faxed to:  10/17/13 Psychiatric Admission Assessment Note:   Faxed to:  10/17/13 Suicide Risk Assessment - Discharge Assessment:   Faxed to:  10/17/13 Faxed/Sent to the Next Level Care provider:  10/17/13 Faxed to [facility] @ [phone] [facility] @ [phone]  Jerelene ReddenSheena E Savage, 10/17/2013, 4:01 PM

## 2013-11-02 ENCOUNTER — Encounter (HOSPITAL_COMMUNITY): Payer: Self-pay | Admitting: *Deleted

## 2013-11-02 ENCOUNTER — Observation Stay (HOSPITAL_COMMUNITY)
Admission: AD | Admit: 2013-11-02 | Discharge: 2013-11-04 | Disposition: A | Discharge status: Rehabilitation Facility | Payer: Self-pay | Source: Intra-hospital | Attending: Psychiatry | Admitting: Psychiatry

## 2013-11-02 ENCOUNTER — Emergency Department (HOSPITAL_COMMUNITY)
Admission: EM | Admit: 2013-11-02 | Discharge: 2013-11-02 | Disposition: A | Discharge status: Other Acute Inpatient Hospital | Payer: Self-pay | Attending: Emergency Medicine | Admitting: Emergency Medicine

## 2013-11-02 ENCOUNTER — Encounter (HOSPITAL_COMMUNITY): Payer: Self-pay | Admitting: Emergency Medicine

## 2013-11-02 DIAGNOSIS — F1210 Cannabis abuse, uncomplicated: Secondary | ICD-10-CM | POA: Insufficient documentation

## 2013-11-02 DIAGNOSIS — F1010 Alcohol abuse, uncomplicated: Secondary | ICD-10-CM | POA: Insufficient documentation

## 2013-11-02 DIAGNOSIS — F1193 Opioid use, unspecified with withdrawal: Secondary | ICD-10-CM

## 2013-11-02 DIAGNOSIS — F1910 Other psychoactive substance abuse, uncomplicated: Secondary | ICD-10-CM

## 2013-11-02 DIAGNOSIS — F339 Major depressive disorder, recurrent, unspecified: Secondary | ICD-10-CM | POA: Diagnosis present

## 2013-11-02 DIAGNOSIS — F411 Generalized anxiety disorder: Secondary | ICD-10-CM | POA: Insufficient documentation

## 2013-11-02 DIAGNOSIS — F4310 Post-traumatic stress disorder, unspecified: Secondary | ICD-10-CM

## 2013-11-02 DIAGNOSIS — Z79899 Other long term (current) drug therapy: Secondary | ICD-10-CM | POA: Insufficient documentation

## 2013-11-02 DIAGNOSIS — F1124 Opioid dependence with opioid-induced mood disorder: Secondary | ICD-10-CM

## 2013-11-02 DIAGNOSIS — F1123 Opioid dependence with withdrawal: Secondary | ICD-10-CM

## 2013-11-02 DIAGNOSIS — F1310 Sedative, hypnotic or anxiolytic abuse, uncomplicated: Secondary | ICD-10-CM | POA: Insufficient documentation

## 2013-11-02 DIAGNOSIS — F1120 Opioid dependence, uncomplicated: Secondary | ICD-10-CM | POA: Insufficient documentation

## 2013-11-02 DIAGNOSIS — F1920 Other psychoactive substance dependence, uncomplicated: Secondary | ICD-10-CM

## 2013-11-02 DIAGNOSIS — R231 Pallor: Secondary | ICD-10-CM | POA: Insufficient documentation

## 2013-11-02 DIAGNOSIS — F17200 Nicotine dependence, unspecified, uncomplicated: Secondary | ICD-10-CM | POA: Insufficient documentation

## 2013-11-02 DIAGNOSIS — F19939 Other psychoactive substance use, unspecified with withdrawal, unspecified: Principal | ICD-10-CM | POA: Insufficient documentation

## 2013-11-02 HISTORY — DX: Anxiety disorder, unspecified: F41.9

## 2013-11-02 HISTORY — DX: Other psychoactive substance abuse, uncomplicated: F19.10

## 2013-11-02 LAB — CBC WITH DIFFERENTIAL/PLATELET
Basophils Absolute: 0 10*3/uL (ref 0.0–0.1)
Basophils Relative: 0 % (ref 0–1)
Eosinophils Absolute: 0.1 10*3/uL (ref 0.0–0.7)
Eosinophils Relative: 1 % (ref 0–5)
HCT: 47.6 % (ref 39.0–52.0)
HEMOGLOBIN: 16.5 g/dL (ref 13.0–17.0)
LYMPHS ABS: 1.3 10*3/uL (ref 0.7–4.0)
LYMPHS PCT: 11 % — AB (ref 12–46)
MCH: 32.2 pg (ref 26.0–34.0)
MCHC: 34.7 g/dL (ref 30.0–36.0)
MCV: 92.8 fL (ref 78.0–100.0)
MONOS PCT: 6 % (ref 3–12)
Monocytes Absolute: 0.7 10*3/uL (ref 0.1–1.0)
NEUTROS ABS: 9.4 10*3/uL — AB (ref 1.7–7.7)
NEUTROS PCT: 82 % — AB (ref 43–77)
PLATELETS: 378 10*3/uL (ref 150–400)
RBC: 5.13 MIL/uL (ref 4.22–5.81)
RDW: 12.7 % (ref 11.5–15.5)
WBC: 11.5 10*3/uL — AB (ref 4.0–10.5)

## 2013-11-02 LAB — RAPID URINE DRUG SCREEN, HOSP PERFORMED
AMPHETAMINES: NOT DETECTED
Barbiturates: POSITIVE — AB
Benzodiazepines: POSITIVE — AB
Cocaine: NOT DETECTED
OPIATES: NOT DETECTED
TETRAHYDROCANNABINOL: POSITIVE — AB

## 2013-11-02 LAB — BASIC METABOLIC PANEL
Anion gap: 14 (ref 5–15)
BUN: 15 mg/dL (ref 6–23)
CHLORIDE: 101 meq/L (ref 96–112)
CO2: 27 mEq/L (ref 19–32)
Calcium: 9.9 mg/dL (ref 8.4–10.5)
Creatinine, Ser: 0.98 mg/dL (ref 0.50–1.35)
GFR calc Af Amer: >90 mL/min (ref >90)
GLUCOSE: 99 mg/dL (ref 70–99)
Potassium: 3.9 mEq/L (ref 3.7–5.3)
SODIUM: 142 meq/L (ref 137–147)

## 2013-11-02 LAB — ETHANOL

## 2013-11-02 MED ORDER — ALUM & MAG HYDROXIDE-SIMETH 200-200-20 MG/5ML PO SUSP: 30.0000 mL | ORAL | Status: DC | PRN

## 2013-11-02 MED ORDER — ONDANSETRON HCL 4 MG PO TABS: 4.0000 mg | ORAL_TABLET | Freq: Three times a day (TID) | ORAL | Status: DC | PRN

## 2013-11-02 MED ORDER — CLONIDINE HCL 0.1 MG PO TABS: 0.1000 mg | ORAL_TABLET | Freq: Every day | ORAL | Status: DC

## 2013-11-02 MED ORDER — CLONIDINE HCL 0.1 MG PO TABS
0.1000 mg | ORAL_TABLET | Freq: Four times a day (QID) | ORAL | Status: DC
  Administered 2013-11-02 – 2013-11-03 (×5): 0.1 mg via ORAL
  Filled 2013-11-02 (×9): qty 1

## 2013-11-02 MED ORDER — CHLORDIAZEPOXIDE HCL 25 MG PO CAPS
25.0000 mg | ORAL_CAPSULE | Freq: Three times a day (TID) | ORAL | Status: DC | PRN
Start: 2013-11-02 — End: 2013-11-04
  Administered 2013-11-02 – 2013-11-03 (×3): 25 mg via ORAL
  Filled 2013-11-02 (×2): qty 1

## 2013-11-02 MED ORDER — DICYCLOMINE HCL 20 MG PO TABS
ORAL_TABLET | ORAL | Status: AC
  Filled 2013-11-02: qty 1

## 2013-11-02 MED ORDER — CLONIDINE HCL 0.1 MG PO TABS
ORAL_TABLET | ORAL | Status: AC
  Filled 2013-11-02: qty 1

## 2013-11-02 MED ORDER — PRAZOSIN HCL 1 MG PO CAPS
1.0000 mg | ORAL_CAPSULE | Freq: Every day | ORAL | Status: DC
  Administered 2013-11-02 – 2013-11-03 (×2): 1 mg via ORAL
  Filled 2013-11-02: qty 7
  Filled 2013-11-02 (×4): qty 1

## 2013-11-02 MED ORDER — MIRTAZAPINE 30 MG PO TABS
30.0000 mg | ORAL_TABLET | Freq: Every day | ORAL | Status: DC
  Administered 2013-11-02 – 2013-11-03 (×2): 30 mg via ORAL
  Filled 2013-11-02: qty 2
  Filled 2013-11-02: qty 1
  Filled 2013-11-02: qty 2
  Filled 2013-11-02: qty 7
  Filled 2013-11-02: qty 1

## 2013-11-02 MED ORDER — NAPROXEN 500 MG PO TABS
500.0000 mg | ORAL_TABLET | Freq: Two times a day (BID) | ORAL | Status: DC | PRN
  Administered 2013-11-03: 500 mg via ORAL
  Filled 2013-11-02: qty 6
  Filled 2013-11-02: qty 1

## 2013-11-02 MED ORDER — SERTRALINE HCL 25 MG PO TABS
25.0000 mg | ORAL_TABLET | Freq: Every day | ORAL | Status: DC
  Administered 2013-11-03 – 2013-11-04 (×2): 25 mg via ORAL
  Filled 2013-11-02 (×4): qty 1
  Filled 2013-11-02: qty 7

## 2013-11-02 MED ORDER — IBUPROFEN 200 MG PO TABS: 600.0000 mg | ORAL_TABLET | Freq: Three times a day (TID) | ORAL | Status: DC | PRN

## 2013-11-02 MED ORDER — DICYCLOMINE HCL 20 MG PO TABS
20.0000 mg | ORAL_TABLET | Freq: Four times a day (QID) | ORAL | Status: DC | PRN
  Administered 2013-11-02 – 2013-11-03 (×2): 20 mg via ORAL
  Filled 2013-11-02: qty 12
  Filled 2013-11-02: qty 1

## 2013-11-02 MED ORDER — NICOTINE 21 MG/24HR TD PT24
21.0000 mg | MEDICATED_PATCH | Freq: Every day | TRANSDERMAL | Status: DC
  Administered 2013-11-02: 21 mg via TRANSDERMAL
  Filled 2013-11-02: qty 1

## 2013-11-02 MED ORDER — IBUPROFEN 800 MG PO TABS
800.0000 mg | ORAL_TABLET | Freq: Once | ORAL | Status: AC
  Administered 2013-11-02: 800 mg via ORAL
  Filled 2013-11-02: qty 1

## 2013-11-02 MED ORDER — ACETAMINOPHEN 325 MG PO TABS: 650.0000 mg | ORAL_TABLET | ORAL | Status: DC | PRN

## 2013-11-02 MED ORDER — LOPERAMIDE HCL 2 MG PO CAPS: 2.0000 mg | ORAL_CAPSULE | ORAL | Status: DC | PRN

## 2013-11-02 MED ORDER — METHOCARBAMOL 500 MG PO TABS: 500.0000 mg | ORAL_TABLET | Freq: Three times a day (TID) | ORAL | Status: DC | PRN

## 2013-11-02 MED ORDER — ZOLPIDEM TARTRATE 5 MG PO TABS: 5.0000 mg | ORAL_TABLET | Freq: Every evening | ORAL | Status: DC | PRN

## 2013-11-02 MED ORDER — LORAZEPAM 1 MG PO TABS
1.0000 mg | ORAL_TABLET | Freq: Once | ORAL | Status: AC
  Administered 2013-11-02: 1 mg via ORAL
  Filled 2013-11-02: qty 1

## 2013-11-02 MED ORDER — CLONIDINE HCL 0.1 MG PO TABS: 0.1000 mg | ORAL_TABLET | ORAL | Status: DC

## 2013-11-02 MED ORDER — ONDANSETRON 4 MG PO TBDP
4.0000 mg | ORAL_TABLET | Freq: Four times a day (QID) | ORAL | Status: DC | PRN
  Administered 2013-11-03: 4 mg via ORAL
  Filled 2013-11-02: qty 1

## 2013-11-02 MED ORDER — CHLORDIAZEPOXIDE HCL 25 MG PO CAPS
ORAL_CAPSULE | ORAL | Status: AC
  Filled 2013-11-02: qty 1

## 2013-11-02 MED ORDER — HYDROXYZINE HCL 25 MG PO TABS
25.0000 mg | ORAL_TABLET | Freq: Four times a day (QID) | ORAL | Status: DC | PRN
  Administered 2013-11-03: 25 mg via ORAL
  Filled 2013-11-02: qty 1
  Filled 2013-11-02: qty 12

## 2013-11-02 NOTE — Progress Notes (Signed)
Patient complained of hot flashes, diarrhea and shakiness. Patient reports back pain of 6/10 and depression rated 7/10. Patient endorses SI without a plan. Patient looks anxious and depressed. Accepted his medications as ordered. Offered support and encouragement. Will continue to monitor patient.

## 2013-11-02 NOTE — ED Notes (Signed)
Pt here for detox from opiates.  Has previous hx.  Last detox 4 months ago.  [facility]  Last narcotics 3 days.  Pt having dry heaves, back pain, nausea.  Tremors noted on assessment.

## 2013-11-02 NOTE — ED Notes (Signed)
[facility] called for transport, Report [facility]

## 2013-11-02 NOTE — Plan of Care (Signed)
[facility] Observation Crisis Plan  Reason for Crisis Plan:  Crisis Stabilization and Substance Abuse   Plan of Care:  Referral for Substance Abuse  Family Support:      Current Living Environment:  Living Arrangements: Other (Comment)  Insurance:   Hospital Account   Name Acct ID Class Status Primary Coverage   Mason Jones, Mason Jones [id number] BEHAVIORAL HEALTH OBSERVATION Open None        Guarantor Account (for Hospital Account [id number])   Name Relation to Pt Service Area Active? Acct Type   Cephas Jones, Mason Self [facility] Yes Behavioral Health   Address Phone       [address] [place], [place] [address] [phone](H)          Coverage Information (for Hospital Account [id number])   Not on file      Legal Guardian:     Primary Care Provider:  No primary provider on file.  Current Outpatient Providers:  unknown  Psychiatrist:     Counselor/Therapist:     Compliant with Medications:  No  Additional Information:   Mason Jones, Mason Jones 8/7/20154:55 PM

## 2013-11-02 NOTE — Plan of Care (Signed)
[facility] Observation Crisis Plan  Reason for Crisis Plan:  Substance Abuse   Plan of Care:  Pt is to remain in Observation Unit tonight, 11/02/2013 - 11/03/2013.  Family Support:    Steffanie RainwaterFiancee; pt recently learned that she is pregnant with their child.  Current Living Environment:  Living Arrangements: Other (Comment); Homeless; pt will be permitted to return to his fiancee's household after completing a 30 day or 60 day substance abuse rehabilitation program.  Insurance:  [facility]   Name Acct ID Class Status Primary Coverage   Urbano HeirRice, Darrien [id number] BEHAVIORAL HEALTH OBSERVATION Open None        Guarantor Account (for Hospital Account [id number])   Name Relation to Pt Service Area Active? Acct Type   Cephas Darbyice, Khyrin Self [facility] Yes Behavioral Health   Address Phone       [address] [place], [place] [address] [phone](H)          Coverage Information (for Hospital Account [id number])   Not on file      Legal Guardian:   Self  Primary Care Provider:  No primary provider on file.  Current Outpatient Providers:  [facility] [place], [place]  Psychiatrist:    [facility] [place]  Counselor/Therapist:   None  Compliant with Medications:  No; stopped taking Strattera, Remeron, Seroquel and Effexor XR about 1 month ago.  He attributes this in part to relapse, and in part to feeling that they were not effective.  Additional Information: Pt admitted to Observation Unit this afternoon (11/02/2013).  He has not yet been seen by a provider.  He will remain in the Observation Unit tonight.  Doylene Canninghomas Josmar Messimer, MA Triage Specialist Raphael GibneyHughes, Hannibal Skalla Patrick 8/7/20155:13 PM

## 2013-11-02 NOTE — Progress Notes (Signed)
[facility] INPATIENT:  Family/Significant Other Suicide Prevention Education  Suicide Prevention Education:  Patient Refusal for Family/Significant Other Suicide Prevention Education: The patient Mason Jones has refused to provide written consent for family/significant other to be provided Family/Significant Other Suicide Prevention Education during admission and/or prior to discharge.  Physician notified.  Mason Jones, Keion Neels J 11/02/2013, 5:03 PM

## 2013-11-02 NOTE — Progress Notes (Signed)
Patient ID: Mason Jones, male   DOB: 01/29/1986, 28 y.o.   MRN: [id number] Patient admitted to obs unit with substance abuse issues. Patient complaining of aching all over. Affect sad.  Patient stated that he was using heavily because he just found out his girlfriend in pregnant.  Lost his job and grandmother recently. VS stable.  Denies HI, SI, AVH at this timeOriented to unit. Nutrition offered. Education provided regarding safety and falls. Safety checks started every 15 minutes.

## 2013-11-02 NOTE — ED Provider Notes (Signed)
Medical screening examination/treatment/procedure(s) were performed by non-physician practitioner and as supervising physician I was immediately available for consultation/collaboration.   EKG Interpretation None        Jackob Crookston Vir, MD 11/02/13 1536 
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
## 2013-11-02 NOTE — ED Notes (Signed)
Patient has one bag of belongings in locker 28. 

## 2013-11-02 NOTE — ED Provider Notes (Signed)
CSN: [id number]     Arrival date & time 11/02/13  1204 History   First MD Initiated Contact with Patient 11/02/13 1222     Chief Complaint  Patient presents with  . Drug Problem     (Consider location/radiation/quality/duration/timing/severity/associated sxs/prior Treatment) HPI  Patient to the ER requesting detox from opiates and he also uses alcohol. He reports that he is having a baby soon and really needs to get clean. He has been clean for 3 days and is now having tremors, back pains, nausea, dry heaving. He feels that he can not do this on his own. [facility]. He admits to feeling depressed, anxious. No clear thoughts of suicide, no homicide. No delusions of hallucinations.    He has been using Dilaudid, Methadone, Roxys and "anything" he can get his hand on. Says he did prison time for his addiction and got out 1 year ago.   Past Medical History  Diagnosis Date  . Drug abuse    History reviewed. No pertinent past surgical history. History reviewed. No pertinent family history. History  Substance Use Topics  . Smoking status: Current Every Day Smoker -- 1.00 packs/day    Types: Cigarettes  . Smokeless tobacco: Not on file  . Alcohol Use: Yes     Comment: 1-2 times per week    Review of Systems  All other systems reviewed and are negative.        Allergies  Review of patient's allergies indicates no known allergies.  Home Medications   Prior to Admission medications   Medication Sig Start Date End Date Taking? Authorizing Provider  atomoxetine (STRATTERA) 40 MG capsule Take 1 capsule (40 mg total) by mouth daily. For ADHD 10/15/13   Sanjuana Kava, NP  docusate sodium 100 MG CAPS Take 100 mg by mouth 2 (two) times daily. (May buy from over the counter at the pharmacy): For constipation 10/15/13   Sanjuana Kava, NP  mirtazapine (REMERON) 30 MG tablet Take 1 tablet (30 mg total) by mouth at bedtime. For  depression/sleep 10/15/13   Sanjuana Kava, NP  prazosin (MINIPRESS) 1 MG capsule Take 1 capsule (1 mg total) by mouth at bedtime. For PTSD related nightmares 10/15/13   Sanjuana Kava, NP  QUEtiapine (SEROQUEL) 100 MG tablet Take 1 tablet (100 mg total) by mouth 2 (two) times daily. For mood control 10/15/13   Sanjuana Kava, NP  venlafaxine XR (EFFEXOR-XR) 75 MG 24 hr capsule Take 1 capsule (75 mg total) by mouth daily with breakfast. For depression 10/15/13   Sanjuana Kava, NP   BP 126/72  Pulse 88  Temp(Src) 98.8 F (37.1 C) (Oral)  Resp 18  SpO2 100% Physical Exam  Nursing note and vitals reviewed. Constitutional: He appears well-developed and well-nourished. No distress.  tremors  HENT:  Head: Normocephalic and atraumatic.  Eyes: Pupils are equal, round, and reactive to light.  Neck: Normal range of motion. Neck supple.  Cardiovascular: Normal rate and regular rhythm.   Pulmonary/Chest: Effort normal.  Abdominal: Soft.  Musculoskeletal:  Pt has equal strength to bilateral lower extremities.  Neurosensory function adequate to both legs Skin color is normal. Skin is warm and moist.  I see no step off deformity, no midline bony tenderness.  Pt is able to ambulate.  No crepitus, laceration, effusion, induration, lesions, swelling.   Pedal pulses are symmetrical and palpable bilaterally   Neurological: He is alert.  Skin:  Skin is warm and dry. There is pallor.    ED Course  Procedures (including critical care time) Labs Review Labs Reviewed  CBC WITH DIFFERENTIAL  BASIC METABOLIC PANEL  ETHANOL  URINE RAPID DRUG SCREEN (HOSP PERFORMED)    Imaging Review No results found.   EKG Interpretation None      MDM   Final diagnoses:  Alcohol abuse  Polysubstance abuse    Opiates do not meet criteria for inpatient detox. However, the patient does not look well. He has tremors, is depressed and also uses alcohol. I believe that patient needs a TTS consult and medical  screening labs and then from there we can determine the best plan of action.  Screening labs ordered.  TTS consulted Ibuprofen and 1 mg PO Ativan ordered.    Dorthula Matasiffany G Yoshiye Kraft, PA-C 11/02/13 1256

## 2013-11-03 ENCOUNTER — Encounter (HOSPITAL_COMMUNITY): Payer: Self-pay | Admitting: Registered Nurse

## 2013-11-03 DIAGNOSIS — F411 Generalized anxiety disorder: Secondary | ICD-10-CM

## 2013-11-03 DIAGNOSIS — F1910 Other psychoactive substance abuse, uncomplicated: Secondary | ICD-10-CM

## 2013-11-03 DIAGNOSIS — R45851 Suicidal ideations: Secondary | ICD-10-CM

## 2013-11-03 DIAGNOSIS — F1994 Other psychoactive substance use, unspecified with psychoactive substance-induced mood disorder: Secondary | ICD-10-CM

## 2013-11-03 MED ORDER — NICOTINE 21 MG/24HR TD PT24
21.0000 mg | MEDICATED_PATCH | Freq: Every day | TRANSDERMAL | Status: DC
  Administered 2013-11-03: 21 mg via TRANSDERMAL
  Filled 2013-11-03: qty 1
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
## 2013-11-03 NOTE — BH Assessment (Signed)
Spoke to [place] at [facility] [phone] who said Pt is accepted at their facility provide that Pt brings one week supply of medications. Spoke to CDW CorporationKenisha Herbin, [facility] who said pharmacy can provide the medications tomorrow but not tonight. Dois DavenportSandra at [facility] said they will hold the bed until we call and ask them to release it.  Harlin RainFord Ellis Ria CommentWarrick Jr, LPC, [facility] Triage Specialist [phone]

## 2013-11-03 NOTE — Progress Notes (Signed)
Patient ID: Mason Jones, male   DOB: 03/27/1986, 28 y.o.   MRN: [id number] D: Patient continues to suffer withdrawal symptoms with CIWA of 8. Also complaining of back pain. PRN's given. Patient is positive for SI without plan.  A: Patient given emotional support from RN. Patient given medications per MD orders.   R: Patient remains cooperative and appropriate. Contracts for safety. Will continue to monitor patient for safety.

## 2013-11-03 NOTE — Progress Notes (Signed)
[facility] [place], [facility], states that pt just left [facility] on 8.5.15 and would not be eligible for Suboxone tx because it has not been at least 30 days.   Tomi BambergerMariya Husein Guedes Disposition MHT

## 2013-11-03 NOTE — Progress Notes (Addendum)
Pt's referral has been faxed to the following detox facitilities:  [facility]- [facility] [place].    [facility]- per Michelle NasutiElena can fax for review   Tomi BambergerMariya Jamerica Snavely Disposition MHT
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
## 2013-11-04 MED ORDER — ATOMOXETINE HCL 40 MG PO CAPS
40.0000 mg | ORAL_CAPSULE | Freq: Every day | ORAL | Status: DC
  Filled 2013-11-04: qty 1

## 2013-11-04 MED ORDER — SERTRALINE HCL 25 MG PO TABS: 25.0000 mg | ORAL_TABLET | Freq: Every day | ORAL | Status: DC

## 2013-11-04 MED ORDER — NAPROXEN 500 MG PO TABS: 500.0000 mg | ORAL_TABLET | Freq: Two times a day (BID) | ORAL | Status: DC | PRN

## 2013-11-04 MED ORDER — HYDROXYZINE HCL 25 MG PO TABS: 25.0000 mg | ORAL_TABLET | Freq: Four times a day (QID) | ORAL | Status: DC | PRN

## 2013-11-04 MED ORDER — DOCUSATE SODIUM 100 MG PO CAPS
100.0000 mg | ORAL_CAPSULE | Freq: Two times a day (BID) | ORAL | Status: DC
  Filled 2013-11-04 (×3): qty 14

## 2013-11-04 MED ORDER — MIRTAZAPINE 30 MG PO TABS: 30.0000 mg | ORAL_TABLET | Freq: Every day | ORAL | Status: DC

## 2013-11-04 MED ORDER — DICYCLOMINE HCL 20 MG PO TABS: 20.0000 mg | ORAL_TABLET | Freq: Four times a day (QID) | ORAL | Status: DC | PRN

## 2013-11-04 MED ORDER — PRAZOSIN HCL 1 MG PO CAPS: 1.0000 mg | ORAL_CAPSULE | Freq: Every day | ORAL | Status: DC

## 2013-11-04 NOTE — Discharge Summary (Signed)
Physician Discharge Summary Note  Patient:  Mason Jones is an 28 y.o., male MRN:  [id number] DOB:  [birth year] Patient phone:  [phone] (home)  Patient address:   [address] [place] [address],  Total Time spent with patient: 45 minutes  Date of Admission:  11/02/2013 Date of Discharge: 11/04/2013 Reason for Admission:  Opiate detox Discharge Diagnoses: Active Problems:   Opioid use with withdrawal   GAD (generalized anxiety disorder)   Polysubstance dependence   Psychiatric Specialty Exam: Physical Exam  ROS  Blood pressure 108/44, pulse 54, temperature 97.8 F (36.6 C), temperature source Oral, resp. rate 16, height 5' 10"  (1.778 m), weight 68.04 kg (150 lb), SpO2 100.00%.Body mass index is 21.52 kg/(m^2).       Blood pressure 108/44, pulse 54, temperature 97.8 F (36.6 C), temperature source Oral, resp. rate 16, height 5' 10"  (1.778 m), weight 68.04 kg (150 lb), SpO2 100.00%.Body mass index is 21.52 kg/(m^2).   General Appearance: Casual   Eye Contact:: Good   Speech: Clear and Coherent and Normal Rate   Volume: Normal   Mood: Anxious   Affect: Congruent   Thought Process: Negative, NA, Circumstantial and Goal Directed   Orientation: Full (Time, Place, and Person)   Thought Content: WDL   Suicidal Thoughts: No   Homicidal Thoughts: No   Memory: Immediate; Good  Recent; Good  Remote; Good   Judgement: Fair   Insight: Fair   Psychomotor Activity: Normal   Concentration: Fair   Recall: Good   Fund of Knowledge:Good   Language: Good   Akathisia: No   Handed: Right   AIMS (if indicated):   Assets: Communication Skills  Desire for Improvement  Housing  Social Support  Transportation   Sleep:   Musculoskeletal:  Strength & Muscle Tone: within normal limits  Gait & Station: normal  Patient leans: N/A  DSM5:  Schizophrenia Disorders:  denies Obsessive-Compulsive Disorders:  denies Trauma-Stressor Disorders:  Posttraumatic Stress Disorder  (309.81) Substance/Addictive Disorders:  Cannabis Use Disorder - Mild (305.20) and Opioid Disorder - Moderate (304.00) Depressive Disorders:  denies  Axis Diagnosis:  AXIS I: Substance Abuse  AXIS II: Deferred  AXIS III:  Past Medical History   Diagnosis  Date   .  Drug abuse    .  Anxiety     AXIS IV: other psychosocial or environmental problems  AXIS V: 61-70 mild symptoms      Level of Care:  Rehab  Hospital Course:  Observation for 24 hours with opiate detox.  Patient referred to [facility] and accepted for inpatient detox. Consults:  psychiatry  Significant Diagnostic Studies:  labs: UDS, CBC, CMET  Discharge Vitals:   Blood pressure 108/44, pulse 54, temperature 97.8 F (36.6 C), temperature source Oral, resp. rate 16, height 5' 10"  (1.778 m), weight 68.04 kg (150 lb), SpO2 100.00%. Body mass index is 21.52 kg/(m^2). Lab Results:   Results for orders placed during the hospital encounter of 11/02/13 (from the past 72 hour(s))  URINE RAPID DRUG SCREEN (HOSP PERFORMED)     Status: Abnormal   Collection Time    11/02/13  1:06 PM      Result Value Ref Range   Opiates NONE DETECTED  NONE DETECTED   Cocaine NONE DETECTED  NONE DETECTED   Benzodiazepines POSITIVE (*) NONE DETECTED   Amphetamines NONE DETECTED  NONE DETECTED   Tetrahydrocannabinol POSITIVE (*) NONE DETECTED   Barbiturates POSITIVE (*) NONE DETECTED   Comment:  DRUG SCREEN FOR MEDICAL PURPOSES     ONLY.  IF CONFIRMATION IS NEEDED     FOR ANY PURPOSE, NOTIFY LAB     WITHIN 5 DAYS.                LOWEST DETECTABLE LIMITS     FOR URINE DRUG SCREEN     Drug Class       Cutoff (ng/mL)     Amphetamine      1000     Barbiturate      200     Benzodiazepine   211     Tricyclics       941     Opiates          300     Cocaine          300     THC              50  CBC WITH DIFFERENTIAL     Status: Abnormal   Collection Time    11/02/13  1:08 PM      Result Value Ref Range   WBC 11.5 (*) 4.0 - 10.5  K/uL   RBC 5.13  4.22 - 5.81 MIL/uL   Hemoglobin 16.5  13.0 - 17.0 g/dL   HCT 47.6  39.0 - 52.0 %   MCV 92.8  78.0 - 100.0 fL   MCH 32.2  26.0 - 34.0 pg   MCHC 34.7  30.0 - 36.0 g/dL   RDW 12.7  11.5 - 15.5 %   Platelets 378  150 - 400 K/uL   Neutrophils Relative % 82 (*) 43 - 77 %   Neutro Abs 9.4 (*) 1.7 - 7.7 K/uL   Lymphocytes Relative 11 (*) 12 - 46 %   Lymphs Abs 1.3  0.7 - 4.0 K/uL   Monocytes Relative 6  3 - 12 %   Monocytes Absolute 0.7  0.1 - 1.0 K/uL   Eosinophils Relative 1  0 - 5 %   Eosinophils Absolute 0.1  0.0 - 0.7 K/uL   Basophils Relative 0  0 - 1 %   Basophils Absolute 0.0  0.0 - 0.1 K/uL  BASIC METABOLIC PANEL     Status: None   Collection Time    11/02/13  1:08 PM      Result Value Ref Range   Sodium 142  137 - 147 mEq/L   Potassium 3.9  3.7 - 5.3 mEq/L   Chloride 101  96 - 112 mEq/L   CO2 27  19 - 32 mEq/L   Glucose, Bld 99  70 - 99 mg/dL   BUN 15  6 - 23 mg/dL   Creatinine, Ser 0.98  0.50 - 1.35 mg/dL   Calcium 9.9  8.4 - 10.5 mg/dL   GFR calc non Af Amer >90  >90 mL/min   GFR calc Af Amer >90  >90 mL/min   Comment: (NOTE)     The eGFR has been calculated using the CKD EPI equation.     This calculation has not been validated in all clinical situations.     eGFR's persistently <90 mL/min signify possible Chronic Kidney     Disease.   Anion gap 14  5 - 15  ETHANOL     Status: None   Collection Time    11/02/13  1:08 PM      Result Value Ref Range   Alcohol, Ethyl (B) <11  0 - 11 mg/dL   Comment:  LOWEST DETECTABLE LIMIT FOR     SERUM ALCOHOL IS 11 mg/dL     FOR MEDICAL PURPOSES ONLY    Physical Findings: AIMS: Facial and Oral Movements Muscles of Facial Expression: None, normal Lips and Perioral Area: None, normal Jaw: None, normal Tongue: None, normal,Extremity Movements Upper (arms, wrists, hands, fingers): None, normal Lower (legs, knees, ankles, toes): None, normal, Trunk Movements Neck, shoulders, hips: None, normal,  Overall Severity Severity of abnormal movements (highest score from questions above): None, normal Incapacitation due to abnormal movements: None, normal Patient's awareness of abnormal movements (rate only patient's report): No Awareness, Dental Status Current problems with teeth and/or dentures?: No Does patient usually wear dentures?: No  CIWA:  CIWA-Ar Total: 3 COWS:  COWS Total Score: 7  Psychiatric Specialty Exam: See Psychiatric Specialty Exam and Suicide Risk Assessment completed by Attending Physician prior to discharge.  Discharge destination:  Other:  [facility]  Is patient on multiple antipsychotic therapies at discharge:  No   Has Patient had three or more failed trials of antipsychotic monotherapy by history:  No  Recommended Plan for Multiple Antipsychotic Therapies: NA  Discharge Instructions   Diet - low sodium heart healthy    Complete by:  As directed      Discharge instructions    Complete by:  As directed   Follow up with [facility] for inpatient detox treatment     Increase activity slowly    Complete by:  As directed             Medication List    STOP taking these medications       QUEtiapine 100 MG tablet  Commonly known as:  SEROQUEL     venlafaxine XR 75 MG 24 hr capsule  Commonly known as:  EFFEXOR-XR      TAKE these medications     Indication   atomoxetine 40 MG capsule  Commonly known as:  STRATTERA  Take 1 capsule (40 mg total) by mouth daily. For ADHD   Indication:  Attention Deficit Hyperactivity Disorder     dicyclomine 20 MG tablet  Commonly known as:  BENTYL  Take 1 tablet (20 mg total) by mouth every 6 (six) hours as needed for spasms (abdominal cramping).      DSS 100 MG Caps  Take 100 mg by mouth 2 (two) times daily. (May buy from over the counter at the pharmacy): For constipation   Indication:  Constipation     hydrOXYzine 25 MG tablet  Commonly known as:  ATARAX/VISTARIL  Take 1 tablet (25 mg total) by mouth every 6 (six) hours  as needed for anxiety.      mirtazapine 30 MG tablet  Commonly known as:  REMERON  Take 1 tablet (30 mg total) by mouth at bedtime.   Indication:  Trouble Sleeping     naproxen 500 MG tablet  Commonly known as:  NAPROSYN  Take 1 tablet (500 mg total) by mouth 2 (two) times daily as needed (aching, pain, or discomfort).      prazosin 1 MG capsule  Commonly known as:  MINIPRESS  Take 1 capsule (1 mg total) by mouth at bedtime. For PTSD related nightmares   Indication:  Nightmares     sertraline 25 MG tablet  Commonly known as:  ZOLOFT  Take 1 tablet (25 mg total) by mouth daily.            Follow-up Information   Please follow up. (Follow up with [facility])  Follow-up recommendations:  Activity:  as tolerated Diet:  as tolerated  Comments:  Patient will be transported to [facility] for inpatient treatment opiate detox Total Discharge Time:  Greater than 30 minutes.  SignedEarleen Newport, FNP-BC 11/04/2013, 9:59 AM

## 2013-11-04 NOTE — Progress Notes (Signed)
Patient ID: Mason Jones, male   DOB: 09/08/1985, 28 y.o.   MRN: [id number] Patient discharged per MD orders. Patient given education regarding follow-up appointments and medications. Patient denies any questions or concerns about these instructions. Patient was escorted to locker and given belongings before discharge to hospital lobby to [place].. Patient currently denies SI/HI and auditory and visual hallucinations on discharge.

## 2013-11-04 NOTE — Progress Notes (Signed)
Patient appeared sad and depressed. Patient refuses to answer any question from this Clinical research associatewriter. Later, patient stated "nothing has changed" and asked for pretzels and ginger ale. Patient was encouraged to verbalize his needs to staff be compliant with medications and treatment plan. Will continue to monitor patient.

## 2013-11-04 NOTE — Discharge Instructions (Signed)
Addiction in the Family Alcoholism and drug addiction takes a toll on the family. Many families have at least one parent who needs treatment for alcohol or drug dependency. Many children are exposed to illegal drug use in these families. Children of addiction (or COA's) are at great risk for:  Mental illness or emotional problems, such as:  Depression.  Anxiety.  Physical health problems.  Learning problems.  Children whose parents abuse alcohol or drugs are almost three times more likely to be abused. The abuse can be verbal, physical, or sexual. They are 4 times more likely than other children to be neglected. Strong scientific evidence suggests that addiction runs in families. Children of alcoholics are 4 times more likely than non-COA's to develop alcoholism or other drug problems. RESEARCH Research shows that many children with drug or alcohol dependent parents can benefit from adults who help and encourage them. Those children may cope well with the trauma of growing up in families affected by addiction. They often say that success is due to the support of:  A non-alcoholic parent.  Relative.  Teachers Other trusted adult in their lives that can help include:  Doctors.  Teachers.  Guidance counselors.  Advertising account planner.  Social workers.  Chiropractor.  Faith/spirituality Clinical cytogeneticist. UNDERSTANDING CHILDREN AND ADDICTION Children in alcohol or drug dependent homes often live with denial, shame, and silence about their family problems. The chaos in the family may lead to a painful, unsafe setting. COA's often take on the parent's responsibilities. For many, this results in a loss of childhood. Without help these children grow into adults (Adult Children of Alcoholics or ACOA's) who spend much of their time trying to make up for hidden fears, shame and denied anger from childhood. Without treatment, these adults experience ongoing problem with interpersonal  relationships and daily functioning.  Some COA's display negative behaviors that may warn adults of a problem at home. Others work hard to succeed and please in spite of the stress at home. The unstable home life may have a negative impact on their future. Children of alcoholics are more likely to have substance abuse problems. WHEN PARENTS RECEIVE TREATMENT Living with an active alcohol or drug dependent adult is hard for the whole family. Even having a parent go through treatment can be painful for children. This change can confuse children. When a parent receives treatment, each family member should also receive appropriate services. That way all members of the family can recover from the impact of addiction. HOW YOU CAN HELP Adults can support COA's in these ways.   Provide children with information about addiction. Tell them:  Alcohol/drug dependency is an illness. You did not cause it. You cannot fix it.  Take care of yourself by talking with a trusted person.  Make good choices in your own life.  Addiction treatment can help your parent get well.  You are not alone. You need and deserve help. There are safe people and places that can help you.  Teach children how to share their feelings in healthy ways. One way is by speaking with "safe" adults. Guide them to support programs at school or in your community. Such programs can help them develop coping skills.  Take the time to develop a healthy relationship with a COA who needs you. Children who live in addicted families learn not to trust adults. Listening to and supporting these children can change much of that mistrust. You can have a positive impact on a child's life.  If you can, guide the adults in the family to a qualified professional. He/she can help them get the treatment they need to begin recovery. One way to begin recovery is through an intervention. Only a Herbalist should lead an intervention. WHERE YOU AND  COA'S CAN TURN FOR HELP Many resources can help adults identify and support COA's. Learn about local support groups such as Alateen and Al-Anon. School programs can assist COA's, too. [employer] Partners that can provide resource materials to caring adults as well as COA's. Just showing an interest in the child and offering support can make a difference in his/her life. For more information about how you can help children who live in alcohol or drug dependent families, please contact any one of the following organizations.  Organizations for [employer] and ACOA's [employer] for Children of Alcoholics (NACoA) 888-55-4COAS 2031868007)  nacoa@nacoa .org www.nacoa.org  Al-Anon/Alateen  For Families and Friends of Alcoholics 888-4AL-ANON/907-835-4022  WSO@al -anon.org www.al-anon.org 800-ALCOHOL for information and referrals Adult Children Of Alcoholics World Engineer, mining.adultchildren.org  Resources for information on substance abuse and treatment Center for Substance Abuse Treatment (CSAT) Substance Abuse and Mental Health Services Administration [facility]) CSAT's [employer] 800-662-HELP (Toll-Free) 234-631-4088 (TDD) 615 718 0597 (Spanish) info@samhsa .gov SkateOasis.com.pt SAMHSA's National Clearinghouse for Alcohol and Drug Information (NCADI) 984-163-1640 769-311-0809 (TDD)  info@samhsa .gov SkateOasis.com.pt National Council on Alcoholism and Drug Dependence, Inc. (NCADD) 800-NCA-CALL  national@ncadd .org www.ncadd.org Office of [employer] (ONDCP) 936-776-5955  ondcp@ncjrs .org www.whitehousedrugpolicy.gov Mason Jones: www.freevibe.com is an Occupational hygienist for youth 11-18 focusing on drug-specific information in an entertainment setting.  Adults can get drug-specific information and tips for children by visiting the multilingual website, www.theantidrug.com, designed by the [employer] to help adults keep kids  healthy, safe and drug-free. Document Released: 11/19/2003 Document Revised: 06/07/2011 Document Reviewed: 06/04/2013 [facility] Patient Information 2015 [place], [place]. This information is not intended to replace advice given to you by your health care provider. Make sure you discuss any questions you have with your health care provider.  Addiction in the Family Alcoholism and drug addiction takes a toll on the family. Many families have at least one parent who needs treatment for alcohol or drug dependency. Many children are exposed to illegal drug use in these families. Children of addiction (or COA's) are at great risk for:  Mental illness or emotional problems, such as:  Depression.  Anxiety.  Physical health problems.  Learning problems.  Children whose parents abuse alcohol or drugs are almost three times more likely to be abused. The abuse can be verbal, physical, or sexual. They are 4 times more likely than other children to be neglected. Strong scientific evidence suggests that addiction runs in families. Children of alcoholics are 4 times more likely than non-COA's to develop alcoholism or other drug problems. RESEARCH Research shows that many children with drug or alcohol dependent parents can benefit from adults who help and encourage them. Those children may cope well with the trauma of growing up in families affected by addiction. They often say that success is due to the support of:  A non-alcoholic parent.  Relative.  Teachers Other trusted adult in their lives that can help include:  Doctors.  Teachers.  Guidance counselors.  Theatre manager.  Social workers.  Consulting civil engineer.  Faith/spirituality Multimedia programmer. UNDERSTANDING CHILDREN AND ADDICTION Children in alcohol or drug dependent homes often live with denial, shame, and silence about their family problems. The chaos in the family may lead to a painful, unsafe setting. COA's  often take on  the parent's responsibilities. For many, this results in a loss of childhood. Without help these children grow into adults (Adult Children of Alcoholics or ACOA's) who spend much of their time trying to make up for hidden fears, shame and denied anger from childhood. Without treatment, these adults experience ongoing problem with interpersonal relationships and daily functioning.  Some COA's display negative behaviors that may warn adults of a problem at home. Others work hard to succeed and please in spite of the stress at home. The unstable home life may have a negative impact on their future. Children of alcoholics are more likely to have substance abuse problems. WHEN PARENTS RECEIVE TREATMENT Living with an active alcohol or drug dependent adult is hard for the whole family. Even having a parent go through treatment can be painful for children. This change can confuse children. When a parent receives treatment, each family member should also receive appropriate services. That way all members of the family can recover from the impact of addiction. HOW YOU CAN HELP Adults can support COA's in these ways.   Provide children with information about addiction. Tell them:  Alcohol/drug dependency is an illness. You did not cause it. You cannot fix it.  Take care of yourself by talking with a trusted person.  Make good choices in your own life.  Addiction treatment can help your parent get well.  You are not alone. You need and deserve help. There are safe people and places that can help you.  Teach children how to share their feelings in healthy ways. One way is by speaking with "safe" adults. Guide them to support programs at school or in your community. Such programs can help them develop coping skills.  Take the time to develop a healthy relationship with a COA who needs you. Children who live in addicted families learn not to trust adults. Listening to and supporting these children can change  much of that mistrust. You can have a positive impact on a child's life.  If you can, guide the adults in the family to a qualified professional. He/she can help them get the treatment they need to begin recovery. One way to begin recovery is through an intervention. Only a Herbalistqualified professional should lead an intervention. WHERE YOU AND COA'S CAN TURN FOR HELP Many resources can help adults identify and support COA's. Learn about local support groups such as Alateen and Al-Anon. School programs can assist COA's, too. [employer] PartnersContact national organizations that can provide resource materials to caring adults as well as COA's. Just showing an interest in the child and offering support can make a difference in his/her life. For more information about how you can help children who live in alcohol or drug dependent families, please contact any one of the following organizations.  Organizations for [employer] CompanyCOA's and ACOA's The [employer] Association for Children of Alcoholics (NACoA) 888-55-4COAS 606-164-6740(2627)  nacoa@nacoa .org www.nacoa.org  Al-Anon/Alateen  For Families and Friends of Alcoholics 888-4AL-ANON/305 314 8867  WSO@al -anon.org www.al-anon.org 800-ALCOHOL for information and referrals Adult Children Of Alcoholics World Engineer, miningervice Organization www.adultchildren.org  Resources for information on substance abuse and treatment Center for Substance Abuse Treatment (CSAT) Substance Abuse and Mental Health Services Administration [facility](SAMHSA) CSAT's [employer] Helpline 800-662-HELP (Toll-Free) (916)014-3904512 567 0038 (TDD) 205-090-7309367-699-7755 (Spanish) info@samhsa .gov SkateOasis.com.ptwww.samhsa.gov SAMHSA's National Clearinghouse for Alcohol and Drug Information (NCADI) 307-390-8024671-309-6461 705-593-8629512 567 0038 (TDD)  info@samhsa .gov SkateOasis.com.ptwww.samhsa.gov National Council on Alcoholism and Drug Dependence, Inc. (NCADD) 800-NCA-CALL  national@ncadd .org www.ncadd.org Office of Consolidated Edisonational Drug Control Policy (ONDCP) (813)326-8040(332) 209-9180  ondcp@ncjrs .org  www.whitehousedrugpolicy.gov Mason FloroFreevibe:  www.freevibe.com is an Occupational hygienist for youth 11-18 focusing on drug-specific information in an entertainment setting.  Adults can get drug-specific information and tips for children by visiting the multilingual website, www.theantidrug.com, designed by the [employer] to help adults keep kids healthy, safe and drug-free. Document Released: 11/19/2003 Document Revised: 06/07/2011 Document Reviewed: 06/04/2013 [facility] Patient Information 2015 [place], [place]. This information is not intended to replace advice given to you by your health care provider. Make sure you discuss any questions you have with your health care provider.  Finding Treatment for Alcohol and Drug Addiction It can be hard to find the right place to get professional treatment. Here are some important things to consider:  There are different types of treatment to choose from.  Some programs are live-in (residential) while others are not (outpatient). Sometimes a combination is offered.  No single type of program is right for everyone.  Most treatment programs involve a combination of education, counseling, and a 12-step, spiritually-based approach.  There are non-spiritually based programs (not 12-step).  Some treatment programs are government sponsored. They are geared for patients without private insurance.  Treatment programs can vary in many respects such as:  Cost and types of insurance accepted.  Types of on-site medical services offered.  Length of stay, setting, and size.  Overall philosophy of treatment. A person may need specialized treatment or have needs not addressed by all programs. For example, adolescents need treatment appropriate for their age. Other people have secondary disorders that must be managed as well. Secondary conditions can include mental illness, such as depression or diabetes. Often, a period of  detoxification from alcohol or drugs is needed. This requires medical supervision and not all programs offer this. THINGS TO CONSIDER WHEN SELECTING A TREATMENT PROGRAM   Is the program certified by the appropriate government agency? Even private programs must be certified and employ certified professionals.  Does the program accept your insurance? If not, can a payment plan be set up?  Is the facility clean, organized, and well run? Do they allow you to speak with graduates who can share their treatment experience with you? Can you tour the facility? Can you meet with staff?  Does the program meet the full range of individual needs?  Does the treatment program address sexual orientation and physical disabilities? Do they provide age, gender, and culturally appropriate treatment services?  Is treatment available in languages other than English?  Is long-term aftercare support or guidance encouraged and provided?  Is assessment of an individual's treatment plan ongoing to ensure it meets changing needs?  Does the program use strategies to encourage reluctant patients to remain in treatment long enough to increase the likelihood of success?  Does the program offer counseling (individual or group) and other behavioral therapies?  Does the program offer medicine as part of the treatment regimen, if needed?  Is there ongoing monitoring of possible relapse? Is there a defined relapse prevention program? Are services or referrals offered to family members to ensure they understand addiction and the recovery process? This would help them support the recovering individual.  Are 12-step meetings held at the center or is transport available for patients to attend outside meetings? In countries outside of the [place]. and [place], Magazine features editor for contact information for services in your area. Document Released: 02/11/2005 Document Revised: 06/07/2011 Document Reviewed: 08/24/2007 [facility]  Patient Information 2015 [place], [place]. This information is not intended to replace advice given to you by your health  care provider. Make sure you discuss any questions you have with your health care provider.  Finding Treatment for Alcohol and Drug Addiction It can be hard to find the right place to get professional treatment. Here are some important things to consider:  There are different types of treatment to choose from.  Some programs are live-in (residential) while others are not (outpatient). Sometimes a combination is offered.  No single type of program is right for everyone.  Most treatment programs involve a combination of education, counseling, and a 12-step, spiritually-based approach.  There are non-spiritually based programs (not 12-step).  Some treatment programs are government sponsored. They are geared for patients without private insurance.  Treatment programs can vary in many respects such as:  Cost and types of insurance accepted.  Types of on-site medical services offered.  Length of stay, setting, and size.  Overall philosophy of treatment. A person may need specialized treatment or have needs not addressed by all programs. For example, adolescents need treatment appropriate for their age. Other people have secondary disorders that must be managed as well. Secondary conditions can include mental illness, such as depression or diabetes. Often, a period of detoxification from alcohol or drugs is needed. This requires medical supervision and not all programs offer this. THINGS TO CONSIDER WHEN SELECTING A TREATMENT PROGRAM   Is the program certified by the appropriate government agency? Even private programs must be certified and employ certified professionals.  Does the program accept your insurance? If not, can a payment plan be set up?  Is the facility clean, organized, and well run? Do they allow you to speak with graduates who can share their treatment  experience with you? Can you tour the facility? Can you meet with staff?  Does the program meet the full range of individual needs?  Does the treatment program address sexual orientation and physical disabilities? Do they provide age, gender, and culturally appropriate treatment services?  Is treatment available in languages other than English?  Is long-term aftercare support or guidance encouraged and provided?  Is assessment of an individual's treatment plan ongoing to ensure it meets changing needs?  Does the program use strategies to encourage reluctant patients to remain in treatment long enough to increase the likelihood of success?  Does the program offer counseling (individual or group) and other behavioral therapies?  Does the program offer medicine as part of the treatment regimen, if needed?  Is there ongoing monitoring of possible relapse? Is there a defined relapse prevention program? Are services or referrals offered to family members to ensure they understand addiction and the recovery process? This would help them support the recovering individual.  Are 12-step meetings held at the center or is transport available for patients to attend outside meetings? In countries outside of the [place]. and [place], Magazine features editor for contact information for services in your area. Document Released: 02/11/2005 Document Revised: 06/07/2011 Document Reviewed: 08/24/2007 [facility] Patient Information 2015 [place], [place]. This information is not intended to replace advice given to you by your health care provider. Make sure you discuss any questions you have with your health care provider.  Chemical Dependency Chemical dependency is an addiction to drugs or alcohol. It is characterized by the repeated behavior of seeking out and using drugs and alcohol despite harmful consequences to the health and safety of ones self and others.  RISK FACTORS There are certain situations or behaviors  that increase a person's risk for chemical dependency. These include:  A family history  of chemical dependency.  A history of mental health issues, including depression and anxiety.  A home environment where drugs and alcohol are easily available to you.  Drug or alcohol use at a young age. SYMPTOMS  The following symptoms can indicate chemical dependency:  Inability to limit the use of drugs or alcohol.  Nausea, sweating, shakiness, and anxiety that occurs when alcohol or drugs are not being used.  An increase in amount of drugs or alcohol that is necessary to get drunk or high. People who experience these symptoms can assess their use of drugs and alcohol by asking themselves the following questions:  Have you been told by friends or family that they are worried about your use of alcohol or drugs?  Do friends and family ever tell you about things you did while drinking alcohol or using drugs that you do not remember?  Do you lie about using alcohol or drugs or about the amounts you use?  Do you have difficulty completing daily tasks unless you use alcohol or drugs?  Is the level of your work or school performance lower because of your drug or alcohol use?  Do you get sick from using drugs or alcohol but keep using anyway?  Do you feel uncomfortable in social situations unless you use alcohol or drugs?  Do you use drugs or alcohol to help forget problems? An answer of yes to any of these questions may indicate chemical dependency. Professional evaluation is suggested. Document Released: 03/09/2001 Document Revised: 06/07/2011 Document Reviewed: 05/21/2010 [facility] Patient Information 2015 [place], [place]. This information is not intended to replace advice given to you by your health care provider. Make sure you discuss any questions you have with your health care provider.

## 2013-11-04 NOTE — [facility] Suicide Risk Assessment (Cosign Needed)
Suicide Risk Assessment  Discharge Assessment     Demographic Factors:  Male and Caucasian  Total Time spent with patient: 30 minutes  Psychiatric Specialty Exam:     Blood pressure 108/44, pulse 54, temperature 97.8 F (36.6 C), temperature source Oral, resp. rate 16, height 5\' 10"  (1.778 m), weight 68.04 kg (150 lb), SpO2 100.00%.Body mass index is 21.52 kg/(m^2).  General Appearance: Casual  Eye Contact::  Good  Speech:  Clear and Coherent and Normal Rate  Volume:  Normal  Mood:  Anxious  Affect:  Congruent  Thought Process:  Negative, NA, Circumstantial and Goal Directed  Orientation:  Full (Time, Place, and Person)  Thought Content:  WDL  Suicidal Thoughts:  No  Homicidal Thoughts:  No  Memory:  Immediate;   Good Recent;   Good Remote;   Good  Judgement:  Fair  Insight:  Fair  Psychomotor Activity:  Normal  Concentration:  Fair  Recall:  Good  Fund of Knowledge:Good  Language: Good  Akathisia:  No  Handed:  Right  AIMS (if indicated):     Assets:  Communication Skills Desire for Improvement Housing Social Support Transportation  Sleep:       Musculoskeletal: Strength & Muscle Tone: within normal limits Gait & Station: normal Patient leans: N/A   Mental Status Per Nursing Assessment::   On Admission:  NA  Current Mental Status by Physician: Patient denies suicidal/homicidal ideation, psychosis, and paranoia  Loss Factors: NA  Historical Factors: NA  Risk Reduction Factors:   Sense of responsibility to family and Positive social support  Continued Clinical Symptoms:  Alcohol/Substance Abuse/Dependencies  Cognitive Features That Contribute To Risk:  None noted    Suicide Risk:  Minimal: No identifiable suicidal ideation.  Patients presenting with no risk factors but with morbid ruminations; may be classified as minimal risk based on the severity of the depressive symptoms  Discharge Diagnoses:   AXIS I:  Substance Abuse AXIS II:   Deferred AXIS III:   Past Medical History  Diagnosis Date  . Drug abuse   . Anxiety    AXIS IV:  other psychosocial or environmental problems AXIS V:  61-70 mild symptoms  Plan Of Care/Follow-up recommendations:  Activity:  as tolerated Diet:  as tolerated  Is patient on multiple antipsychotic therapies at discharge:  No   Has Patient had three or more failed trials of antipsychotic monotherapy by history:  No  Recommended Plan for Multiple Antipsychotic Therapies: NA    Darien Mignogna, FNP-BC 11/04/2013, 9:47 AM

## 2013-11-07 NOTE — Discharge Summary (Signed)
Patient is back to baseline, denies any suicidal thoughts and can be discharged home

## 2013-11-07 NOTE — H&P (Signed)
[facility] for detox and suicidal thoughts

## 2013-11-12 ENCOUNTER — Encounter (HOSPITAL_COMMUNITY): Payer: Self-pay | Admitting: Emergency Medicine

## 2013-11-12 ENCOUNTER — Emergency Department (HOSPITAL_COMMUNITY)
Admission: EM | Admit: 2013-11-12 | Discharge: 2013-11-13 | Disposition: A | Discharge status: Other Acute Inpatient Hospital | Payer: Federal, State, Local not specified - Other | Attending: Emergency Medicine | Admitting: Emergency Medicine

## 2013-11-12 DIAGNOSIS — F411 Generalized anxiety disorder: Secondary | ICD-10-CM | POA: Insufficient documentation

## 2013-11-12 DIAGNOSIS — Z91199 Patient's noncompliance with other medical treatment and regimen due to unspecified reason: Secondary | ICD-10-CM | POA: Insufficient documentation

## 2013-11-12 DIAGNOSIS — F1310 Sedative, hypnotic or anxiolytic abuse, uncomplicated: Secondary | ICD-10-CM | POA: Insufficient documentation

## 2013-11-12 DIAGNOSIS — R45851 Suicidal ideations: Secondary | ICD-10-CM | POA: Insufficient documentation

## 2013-11-12 DIAGNOSIS — R443 Hallucinations, unspecified: Secondary | ICD-10-CM | POA: Insufficient documentation

## 2013-11-12 DIAGNOSIS — Z9119 Patient's noncompliance with other medical treatment and regimen: Secondary | ICD-10-CM | POA: Insufficient documentation

## 2013-11-12 DIAGNOSIS — F17200 Nicotine dependence, unspecified, uncomplicated: Secondary | ICD-10-CM | POA: Insufficient documentation

## 2013-11-12 DIAGNOSIS — F1210 Cannabis abuse, uncomplicated: Secondary | ICD-10-CM | POA: Insufficient documentation

## 2013-11-12 DIAGNOSIS — F1910 Other psychoactive substance abuse, uncomplicated: Secondary | ICD-10-CM

## 2013-11-12 DIAGNOSIS — Z79899 Other long term (current) drug therapy: Secondary | ICD-10-CM | POA: Insufficient documentation

## 2013-11-12 LAB — RAPID URINE DRUG SCREEN, HOSP PERFORMED
Amphetamines: NOT DETECTED
BENZODIAZEPINES: POSITIVE — AB
Barbiturates: POSITIVE — AB
COCAINE: NOT DETECTED
Opiates: NOT DETECTED
Tetrahydrocannabinol: POSITIVE — AB

## 2013-11-12 LAB — CBC
HCT: 42.7 % (ref 39.0–52.0)
Hemoglobin: 14.5 g/dL (ref 13.0–17.0)
MCH: 31.7 pg (ref 26.0–34.0)
MCHC: 34 g/dL (ref 30.0–36.0)
MCV: 93.4 fL (ref 78.0–100.0)
PLATELETS: 295 10*3/uL (ref 150–400)
RBC: 4.57 MIL/uL (ref 4.22–5.81)
RDW: 12.8 % (ref 11.5–15.5)
WBC: 7.4 10*3/uL (ref 4.0–10.5)

## 2013-11-12 LAB — COMPREHENSIVE METABOLIC PANEL
ALBUMIN: 4.2 g/dL (ref 3.5–5.2)
ALT: 12 U/L (ref 0–53)
ANION GAP: 12 (ref 5–15)
AST: 22 U/L (ref 0–37)
Alkaline Phosphatase: 66 U/L (ref 39–117)
BUN: 12 mg/dL (ref 6–23)
CHLORIDE: 105 meq/L (ref 96–112)
CO2: 26 mEq/L (ref 19–32)
Calcium: 9.4 mg/dL (ref 8.4–10.5)
Creatinine, Ser: 1 mg/dL (ref 0.50–1.35)
GFR calc Af Amer: >90 mL/min (ref >90)
GFR calc non Af Amer: >90 mL/min (ref >90)
Glucose, Bld: 96 mg/dL (ref 70–99)
Potassium: 4.4 mEq/L (ref 3.7–5.3)
Sodium: 143 mEq/L (ref 137–147)
Total Bilirubin: <0.2 mg/dL — ABNORMAL LOW (ref 0.3–1.2)
Total Protein: 7.3 g/dL (ref 6.0–8.3)

## 2013-11-12 LAB — ETHANOL: Alcohol, Ethyl (B): <11 mg/dL (ref 0–11)

## 2013-11-12 LAB — SALICYLATE LEVEL

## 2013-11-12 LAB — ACETAMINOPHEN LEVEL

## 2013-11-12 MED ORDER — NAPROXEN 500 MG PO TABS
500.0000 mg | ORAL_TABLET | Freq: Two times a day (BID) | ORAL | Status: DC | PRN
  Administered 2013-11-13: 500 mg via ORAL
  Filled 2013-11-12: qty 1

## 2013-11-12 MED ORDER — DOCUSATE SODIUM 100 MG PO CAPS
100.0000 mg | ORAL_CAPSULE | Freq: Two times a day (BID) | ORAL | Status: DC
Start: 2013-11-12 — End: 2013-11-13
  Administered 2013-11-12: 100 mg via ORAL
  Filled 2013-11-12 (×3): qty 1

## 2013-11-12 MED ORDER — LORAZEPAM 1 MG PO TABS
1.0000 mg | ORAL_TABLET | Freq: Three times a day (TID) | ORAL | Status: DC | PRN
  Administered 2013-11-12 – 2013-11-13 (×2): 1 mg via ORAL
  Filled 2013-11-12 (×2): qty 1

## 2013-11-12 MED ORDER — SERTRALINE HCL 50 MG PO TABS
25.0000 mg | ORAL_TABLET | Freq: Every day | ORAL | Status: DC
  Administered 2013-11-12 – 2013-11-13 (×2): 25 mg via ORAL
  Filled 2013-11-12 (×2): qty 1

## 2013-11-12 MED ORDER — ONDANSETRON HCL 4 MG PO TABS: 4.0000 mg | ORAL_TABLET | Freq: Three times a day (TID) | ORAL | Status: DC | PRN

## 2013-11-12 MED ORDER — IBUPROFEN 200 MG PO TABS
600.0000 mg | ORAL_TABLET | Freq: Three times a day (TID) | ORAL | Status: DC | PRN
  Administered 2013-11-13: 600 mg via ORAL
  Filled 2013-11-12: qty 3

## 2013-11-12 MED ORDER — ACETAMINOPHEN 325 MG PO TABS: 650.0000 mg | ORAL_TABLET | ORAL | Status: DC | PRN

## 2013-11-12 MED ORDER — HYDROXYZINE HCL 25 MG PO TABS
25.0000 mg | ORAL_TABLET | Freq: Four times a day (QID) | ORAL | Status: DC | PRN
  Administered 2013-11-12 – 2013-11-13 (×2): 25 mg via ORAL
  Filled 2013-11-12 (×2): qty 1

## 2013-11-12 MED ORDER — ALUM & MAG HYDROXIDE-SIMETH 200-200-20 MG/5ML PO SUSP: 30.0000 mL | ORAL | Status: DC | PRN

## 2013-11-12 MED ORDER — ZOLPIDEM TARTRATE 5 MG PO TABS
5.0000 mg | ORAL_TABLET | Freq: Every evening | ORAL | Status: DC | PRN
  Administered 2013-11-12: 5 mg via ORAL
  Filled 2013-11-12: qty 1

## 2013-11-12 MED ORDER — MIRTAZAPINE 30 MG PO TABS
30.0000 mg | ORAL_TABLET | Freq: Every day | ORAL | Status: DC
  Administered 2013-11-12: 30 mg via ORAL
  Filled 2013-11-12: qty 1

## 2013-11-12 MED ORDER — LORAZEPAM 1 MG PO TABS
1.0000 mg | ORAL_TABLET | Freq: Once | ORAL | Status: AC
  Administered 2013-11-12: 1 mg via ORAL
  Filled 2013-11-12: qty 1

## 2013-11-12 MED ORDER — PRAZOSIN HCL 1 MG PO CAPS
1.0000 mg | ORAL_CAPSULE | Freq: Every day | ORAL | Status: DC
  Administered 2013-11-12: 1 mg via ORAL
  Filled 2013-11-12 (×2): qty 1

## 2013-11-12 MED ORDER — NICOTINE 21 MG/24HR TD PT24
21.0000 mg | MEDICATED_PATCH | Freq: Every day | TRANSDERMAL | Status: DC
  Administered 2013-11-12: 21 mg via TRANSDERMAL
  Filled 2013-11-12: qty 1

## 2013-11-12 MED ORDER — DICYCLOMINE HCL 20 MG PO TABS
20.0000 mg | ORAL_TABLET | Freq: Four times a day (QID) | ORAL | Status: DC | PRN
  Administered 2013-11-13: 20 mg via ORAL
  Filled 2013-11-12: qty 1

## 2013-11-12 NOTE — ED Notes (Signed)
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

## 2013-11-12 NOTE — BH Assessment (Addendum)
Assessment Note  Mason Jones is an 28 y.o. male. Patient was brought into the ED by [employer] , the call was initiated by the patient and he is here voluntary.  Patient called [employer] reporting suicidal ideation with no plan but currently reports a plan to jump in traffic.  Per medical notes patient requested detox from opoid(snort/IV) and methamphetamines(snort/IV/smoke).  Patient reports using meth on a five day binge and the last use was two days ago.  Patient reports using heroin half gram (IV), $60-80 daily , and using pain pills in replacement.  Patient reports smoking marijuana sporadically but is not his choice of drug and last using night.  Patient repots last drinking a beer last night.  Patient reports current withdrawal symptoms body aches, shakes, cramps, and hot/cold sweats.  Patient reports hearing a voice telling him to kill himself and hurt himself.    CSW ran patient with Karleen Hampshire, [place] and Dr. Gwendolyn Grant it is recommended to refer inpatient treatment for safety and stabilization.    Axis I: Mood Disorder NOS and Polysubstance Dependence Axis II: Deferred Axis III:  Past Medical History  Diagnosis Date  . Drug abuse   . Anxiety    Axis IV: economic problems, housing problems, occupational problems, other psychosocial or environmental problems, problems related to legal system/crime, problems related to social environment, problems with access to health care services and problems with primary support group Axis V: 41-50 serious symptoms  Past Medical History:  Past Medical History  Diagnosis Date  . Drug abuse   . Anxiety     History reviewed. No pertinent past surgical history.  Family History: No family history on file.  Social History:  reports that he has been smoking Cigarettes.  He has been smoking about 1.00 pack per day. He does not have any smokeless tobacco history on file. He reports that he drinks alcohol. He reports that he uses illicit drugs (Marijuana, Benzodiazepines,  Heroin, Oxycodone, and Hydrocodone).  Additional Social History:     CIWA: CIWA-Ar BP: 161/74 mmHg Pulse Rate: 73 COWS:    Allergies: No Known Allergies  Home Medications:  (Not in a hospital admission)  OB/GYN Status:  No LMP for male patient.  General Assessment Data Location of Assessment: [facility] ACT Assessment: Yes Is this a Tele or Face-to-Face Assessment?: Face-to-Face Is this an Initial Assessment or a Re-assessment for this encounter?: Initial Assessment Living Arrangements: Other (Comment) (homeless) Can pt return to current living arrangement?: Yes Admission Status: Voluntary Is patient capable of signing voluntary admission?: Yes Transfer from: Other (Comment) Referral Source: Self/Family/Friend  Medical Screening Exam [facility] Walk-in ONLY) Medical Exam completed: Yes  [facility] Crisis Care Plan Living Arrangements: Other (Comment) (homeless) Name of Psychiatrist: [facility] in [place]  Education Status Is patient currently in school?: No  Risk to self with the past 6 months Suicidal Ideation: Yes-Currently Present Suicidal Intent: Yes-Currently Present Is patient at risk for suicide?: Yes Suicidal Plan?: Yes-Currently Present Specify Current Suicidal Plan: jump in traffic Access to Means: Yes Specify Access to Suicidal Means: traffic in environment What has been your use of drugs/alcohol within the last 12 months?: meth, opoids, thc, valium Previous Attempts/Gestures: Yes How many times?: 2 Triggers for Past Attempts: Hallucinations;Family contact;Other (Comment) (drug use) Intentional Self Injurious Behavior: None Family Suicide History: No Recent stressful life event(s): Conflict (Comment);Loss (Comment);Financial Problems;Legal Issues;Other (Comment) (drug use) Persecutory voices/beliefs?: Yes Depression: Yes Depression Symptoms: Insomnia;Isolating;Fatigue;Guilt;Loss of interest in usual pleasures;Feeling worthless/self pity;Feeling  angry/irritable Substance abuse history and/or  treatment for substance abuse?: Yes  Risk to Others within the past 6 months Homicidal Ideation: No Thoughts of Harm to Others: No Current Homicidal Intent: No Current Homicidal Plan: No History of harm to others?: No Assessment of Violence: None Noted Does patient have access to weapons?: No Criminal Charges Pending?: Yes Describe Pending Criminal Charges: noice violation Does patient have a court date: Yes Court Date: 02/27/14  Psychosis Hallucinations: With command;Auditory Delusions: None noted  Mental Status Report Appear/Hygiene: Disheveled Eye Contact: Poor Speech: Logical/coherent Level of Consciousness: Restless;Irritable Mood: Anxious Affect: Anxious;Irritable Anxiety Level: Moderate Thought Processes: Relevant Judgement: Impaired Orientation: Person;Place;Time Obsessive Compulsive Thoughts/Behaviors: None  Cognitive Functioning Memory: Recent Intact;Remote Intact IQ: Average Insight: Fair Impulse Control: Poor Appetite: Fair Vegetative Symptoms: None  ADLScreening [facility] Patient's cognitive ability adequate to safely complete daily activities?: Yes Patient able to express need for assistance with ADLs?: Yes Independently performs ADLs?: Yes (appropriate for developmental age)  Prior Inpatient Therapy Prior Inpatient Therapy: Yes Prior Therapy Dates: 2015, 2014, 2013 Prior Therapy Facilty/Provider(s): [facility] Reason for Treatment: detox  Prior Outpatient Therapy Prior Outpatient Therapy: Yes Prior Therapy Facilty/Provider(s[place] Reason for Treatment: methadone tx  ADL Screening (condition at time of admission) Patient's cognitive ability adequate to safely complete daily activities?: Yes Patient able to express need for assistance with ADLs?: Yes Independently performs ADLs?: Yes (appropriate for developmental  age)  Home Assistive Devices/Equipment Home Assistive Devices/Equipment: None      Values / Beliefs Cultural Requests During Hospitalization: None Spiritual Requests During Hospitalization: None        Additional Information 1:1 In Past 12 Months?: No CIRT Risk: No Elopement Risk: No Does patient have medical clearance?: Yes     Disposition:  Disposition Initial Assessment Completed for this Encounter: Yes Disposition of Patient: Inpatient treatment program Type of inpatient treatment program: Adult  On Site Evaluation by:   Reviewed with Physician:    Maryelizabeth Rowanorbett, Brihany Butch A 11/12/2013 8:48 PM

## 2013-11-12 NOTE — ED Notes (Addendum)
Pt presents with complaint of SI with plan tostep in front of car on highway, last attempt 2-3 wks ago by cutting rt forearm.  Hearing voices to states get over with it.    Requesting detox from Heroin, opiates, crystal meth, shooting heroin.  Feeling hopeless, denies HI.  Admits to hx of PTSD, ADHD, Depression, Opiate Dependency, Bipolar DO.  Pt cooperative but anxious at present.

## 2013-11-12 NOTE — ED Notes (Signed)
Bed: [id number] Expected date:  Expected time:  Means of arrival:  Comments: Hold for Kealey Tr2

## 2013-11-12 NOTE — ED Provider Notes (Signed)
CSN: [id number]     Arrival date & time 11/12/13  1654 History   First MD Initiated Contact with Patient 11/12/13 1839     Chief Complaint  Patient presents with  . detox   . Suicidal     (Consider location/radiation/quality/duration/timing/severity/associated sxs/prior Treatment) Patient is a 28 y.o. male presenting with mental health disorder. The history is provided by the patient.  Mental Health Problem Presenting symptoms: hallucinations and suicidal thoughts   Degree of incapacity (severity):  Severe Onset quality:  Gradual Timing:  Constant Progression:  Worsening Chronicity:  Recurrent Context: drug abuse (crystal meth, heroin), noncompliance and stressful life event ("kid on the way")   Relieved by:  Nothing Worsened by:  Nothing tried Ineffective treatments:  None tried   Past Medical History  Diagnosis Date  . Drug abuse   . Anxiety    History reviewed. No pertinent past surgical history. No family history on file. History  Substance Use Topics  . Smoking status: Current Every Day Smoker -- 1.00 packs/day    Types: Cigarettes  . Smokeless tobacco: Not on file  . Alcohol Use: Yes     Comment: 1-2 times per week    Review of Systems  Constitutional: Negative for fever and chills.  Respiratory: Negative for cough and shortness of breath.   Psychiatric/Behavioral: Positive for suicidal ideas and hallucinations.  All other systems reviewed and are negative.     Allergies  Review of patient's allergies indicates no known allergies.  Home Medications   Prior to Admission medications   Medication Sig Start Date End Date Taking? Authorizing Provider  dicyclomine (BENTYL) 20 MG tablet Take 1 tablet (20 mg total) by mouth every 6 (six) hours as needed for spasms (abdominal cramping). 11/04/13  Yes Shuvon Rankin, NP  docusate sodium 100 MG CAPS Take 100 mg by mouth 2 (two) times daily. (May buy from over the counter at the pharmacy): For constipation 10/15/13   Yes Sanjuana Kava, NP  hydrOXYzine (ATARAX/VISTARIL) 25 MG tablet Take 1 tablet (25 mg total) by mouth every 6 (six) hours as needed for anxiety. 11/04/13  Yes Shuvon Rankin, NP  methylphenidate (DAYTRANA) 30 MG/9HR Place 1 patch onto the skin daily. wear patch for 9 hours only each day   Yes Historical Provider, MD  mirtazapine (REMERON) 30 MG tablet Take 1 tablet (30 mg total) by mouth at bedtime. 11/04/13  Yes Shuvon Rankin, NP  naproxen (NAPROSYN) 500 MG tablet Take 1 tablet (500 mg total) by mouth 2 (two) times daily as needed (aching, pain, or discomfort). 11/04/13  Yes Shuvon Rankin, NP  prazosin (MINIPRESS) 1 MG capsule Take 1 mg by mouth at bedtime. For PTSD related nightmares 11/04/13  Yes Shuvon Rankin, NP  sertraline (ZOLOFT) 25 MG tablet Take 1 tablet (25 mg total) by mouth daily. 11/04/13  Yes Shuvon Rankin, NP   BP 124/54  Pulse 52  Temp(Src) 98.4 F (36.9 C) (Oral)  Resp 18  SpO2 100% Physical Exam  Nursing note and vitals reviewed. Constitutional: He is oriented to person, place, and time. He appears well-developed and well-nourished. No distress.  HENT:  Head: Normocephalic and atraumatic.  Mouth/Throat: Oropharynx is clear and moist. No oropharyngeal exudate.  Eyes: EOM are normal. Pupils are equal, round, and reactive to light.  Neck: Normal range of motion. Neck supple.  Cardiovascular: Normal rate and regular rhythm.  Exam reveals no friction rub.   No murmur heard. Pulmonary/Chest: Effort normal and breath sounds normal. No respiratory distress. He  has no wheezes. He has no rales.  Abdominal: Soft. He exhibits no distension. There is no tenderness. There is no rebound.  Musculoskeletal: Normal range of motion. He exhibits no edema.  Neurological: He is alert and oriented to person, place, and time. No cranial nerve deficit. He exhibits normal muscle tone. Coordination normal.  Skin: No rash noted. He is not diaphoretic.  Psychiatric: Thought content is not paranoid and not  delusional. He expresses suicidal ideation. He expresses no homicidal ideation. He expresses no suicidal plans and no homicidal plans.    ED Course  Procedures (including critical care time) Labs Review Labs Reviewed  COMPREHENSIVE METABOLIC PANEL - Abnormal; Notable for the following:    Total Bilirubin <0.2 (*)    All other components within normal limits  SALICYLATE LEVEL - Abnormal; Notable for the following:    Salicylate Lvl <2.0 (*)    All other components within normal limits  URINE RAPID DRUG SCREEN (HOSP PERFORMED) - Abnormal; Notable for the following:    Benzodiazepines POSITIVE (*)    Tetrahydrocannabinol POSITIVE (*)    Barbiturates POSITIVE (*)    All other components within normal limits  ACETAMINOPHEN LEVEL  CBC  ETHANOL    Imaging Review No results found.   EKG Interpretation None      MDM   Final diagnoses:  Suicidal thoughts  Drug abuse    28 year old male here with suicidal ideations and hearing voices. He's heard this voice before and has come and kill himself. He is very stressed out because he is expecting a child, using heroin, crystal meth, cocaine. Here cooperative, will stay on his own accord. Vitals stable. Exam benign. Has history of suicide attempt with cutting his wrists. Consulted psych.    Elwin MochaBlair Jay Haskew, MD 11/12/13 [phone]

## 2013-11-13 ENCOUNTER — Encounter (HOSPITAL_COMMUNITY): Payer: Self-pay | Admitting: General Practice

## 2013-11-13 ENCOUNTER — Inpatient Hospital Stay (HOSPITAL_COMMUNITY)
Admission: AD | Admit: 2013-11-13 | Discharge: 2013-11-21 | DRG: 897 | Disposition: A | Discharge status: Home / Self Care | Payer: Federal, State, Local not specified - Other | Source: Intra-hospital | Attending: Psychiatry | Admitting: Psychiatry

## 2013-11-13 DIAGNOSIS — IMO0001 Reserved for inherently not codable concepts without codable children: Secondary | ICD-10-CM | POA: Diagnosis present

## 2013-11-13 DIAGNOSIS — F1994 Other psychoactive substance use, unspecified with psychoactive substance-induced mood disorder: Secondary | ICD-10-CM | POA: Diagnosis present

## 2013-11-13 DIAGNOSIS — F332 Major depressive disorder, recurrent severe without psychotic features: Secondary | ICD-10-CM | POA: Diagnosis present

## 2013-11-13 DIAGNOSIS — F411 Generalized anxiety disorder: Secondary | ICD-10-CM

## 2013-11-13 DIAGNOSIS — F909 Attention-deficit hyperactivity disorder, unspecified type: Secondary | ICD-10-CM | POA: Diagnosis present

## 2013-11-13 DIAGNOSIS — F17200 Nicotine dependence, unspecified, uncomplicated: Secondary | ICD-10-CM | POA: Diagnosis present

## 2013-11-13 DIAGNOSIS — Z5987 Material hardship due to limited financial resources, not elsewhere classified: Secondary | ICD-10-CM

## 2013-11-13 DIAGNOSIS — F1210 Cannabis abuse, uncomplicated: Secondary | ICD-10-CM | POA: Diagnosis present

## 2013-11-13 DIAGNOSIS — F4310 Post-traumatic stress disorder, unspecified: Secondary | ICD-10-CM | POA: Diagnosis present

## 2013-11-13 DIAGNOSIS — G4700 Insomnia, unspecified: Secondary | ICD-10-CM | POA: Diagnosis present

## 2013-11-13 DIAGNOSIS — F1120 Opioid dependence, uncomplicated: Secondary | ICD-10-CM | POA: Diagnosis present

## 2013-11-13 DIAGNOSIS — F1920 Other psychoactive substance dependence, uncomplicated: Secondary | ICD-10-CM

## 2013-11-13 DIAGNOSIS — F1320 Sedative, hypnotic or anxiolytic dependence, uncomplicated: Secondary | ICD-10-CM | POA: Diagnosis present

## 2013-11-13 DIAGNOSIS — R45851 Suicidal ideations: Secondary | ICD-10-CM

## 2013-11-13 DIAGNOSIS — F1123 Opioid dependence with withdrawal: Secondary | ICD-10-CM

## 2013-11-13 DIAGNOSIS — F339 Major depressive disorder, recurrent, unspecified: Secondary | ICD-10-CM | POA: Diagnosis present

## 2013-11-13 DIAGNOSIS — Z5989 Other problems related to housing and economic circumstances: Secondary | ICD-10-CM | POA: Diagnosis not present

## 2013-11-13 DIAGNOSIS — F1310 Sedative, hypnotic or anxiolytic abuse, uncomplicated: Secondary | ICD-10-CM

## 2013-11-13 DIAGNOSIS — Z598 Other problems related to housing and economic circumstances: Secondary | ICD-10-CM | POA: Diagnosis not present

## 2013-11-13 DIAGNOSIS — F331 Major depressive disorder, recurrent, moderate: Secondary | ICD-10-CM

## 2013-11-13 HISTORY — DX: Major depressive disorder, single episode, unspecified: F32.9

## 2013-11-13 HISTORY — DX: Depression, unspecified: F32.A

## 2013-11-13 MED ORDER — PRAZOSIN HCL 1 MG PO CAPS
1.0000 mg | ORAL_CAPSULE | Freq: Every day | ORAL | Status: DC
  Administered 2013-11-16 – 2013-11-20 (×4): 1 mg via ORAL
  Filled 2013-11-13 (×8): qty 1
  Filled 2013-11-13: qty 14
  Filled 2013-11-13: qty 1

## 2013-11-13 MED ORDER — LOPERAMIDE HCL 2 MG PO CAPS: 2.0000 mg | ORAL_CAPSULE | ORAL | Status: AC | PRN

## 2013-11-13 MED ORDER — DICYCLOMINE HCL 20 MG PO TABS
20.0000 mg | ORAL_TABLET | Freq: Four times a day (QID) | ORAL | Status: AC | PRN
  Administered 2013-11-14 (×2): 20 mg via ORAL
  Filled 2013-11-13 (×2): qty 1

## 2013-11-13 MED ORDER — ONDANSETRON 4 MG PO TBDP: 4.0000 mg | ORAL_TABLET | Freq: Four times a day (QID) | ORAL | Status: AC | PRN

## 2013-11-13 MED ORDER — NICOTINE 21 MG/24HR TD PT24
21.0000 mg | MEDICATED_PATCH | Freq: Every day | TRANSDERMAL | Status: DC
  Administered 2013-11-13: 21 mg via TRANSDERMAL
  Filled 2013-11-13 (×4): qty 1

## 2013-11-13 MED ORDER — ALUM & MAG HYDROXIDE-SIMETH 200-200-20 MG/5ML PO SUSP: 30.0000 mL | ORAL | Status: DC | PRN

## 2013-11-13 MED ORDER — METHOCARBAMOL 500 MG PO TABS
500.0000 mg | ORAL_TABLET | Freq: Three times a day (TID) | ORAL | Status: AC | PRN
  Administered 2013-11-14 – 2013-11-16 (×2): 500 mg via ORAL
  Filled 2013-11-13 (×2): qty 1

## 2013-11-13 MED ORDER — DOCUSATE SODIUM 100 MG PO CAPS
100.0000 mg | ORAL_CAPSULE | Freq: Two times a day (BID) | ORAL | Status: DC
  Administered 2013-11-17 – 2013-11-21 (×4): 100 mg via ORAL
  Filled 2013-11-13 (×6): qty 1
  Filled 2013-11-13: qty 28
  Filled 2013-11-13 (×6): qty 1
  Filled 2013-11-13: qty 28
  Filled 2013-11-13 (×6): qty 1

## 2013-11-13 MED ORDER — MAGNESIUM HYDROXIDE 400 MG/5ML PO SUSP: 30.0000 mL | Freq: Every day | ORAL | Status: DC | PRN

## 2013-11-13 MED ORDER — DICYCLOMINE HCL 20 MG PO TABS: 20.0000 mg | ORAL_TABLET | Freq: Four times a day (QID) | ORAL | Status: DC | PRN

## 2013-11-13 MED ORDER — SERTRALINE HCL 25 MG PO TABS
25.0000 mg | ORAL_TABLET | Freq: Every day | ORAL | Status: DC
  Administered 2013-11-14 – 2013-11-15 (×2): 25 mg via ORAL
  Filled 2013-11-13 (×4): qty 1

## 2013-11-13 MED ORDER — NAPROXEN 500 MG PO TABS: 500.0000 mg | ORAL_TABLET | Freq: Two times a day (BID) | ORAL | Status: DC | PRN

## 2013-11-13 MED ORDER — CLONIDINE HCL 0.1 MG PO TABS
0.1000 mg | ORAL_TABLET | ORAL | Status: AC
  Administered 2013-11-16 – 2013-11-18 (×4): 0.1 mg via ORAL
  Filled 2013-11-13 (×4): qty 1

## 2013-11-13 MED ORDER — CLONIDINE HCL 0.1 MG PO TABS
0.1000 mg | ORAL_TABLET | Freq: Four times a day (QID) | ORAL | Status: AC
  Administered 2013-11-16: 0.1 mg via ORAL
  Filled 2013-11-13 (×11): qty 1

## 2013-11-13 MED ORDER — ACETAMINOPHEN 325 MG PO TABS
650.0000 mg | ORAL_TABLET | Freq: Four times a day (QID) | ORAL | Status: DC | PRN
  Administered 2013-11-14: 650 mg via ORAL
  Filled 2013-11-13: qty 2

## 2013-11-13 MED ORDER — MIRTAZAPINE 30 MG PO TABS
30.0000 mg | ORAL_TABLET | Freq: Every day | ORAL | Status: DC
  Administered 2013-11-14 – 2013-11-20 (×5): 30 mg via ORAL
  Filled 2013-11-13: qty 1
  Filled 2013-11-13: qty 14
  Filled 2013-11-13 (×8): qty 1

## 2013-11-13 MED ORDER — CLONIDINE HCL 0.1 MG PO TABS
0.1000 mg | ORAL_TABLET | Freq: Every day | ORAL | Status: AC
  Administered 2013-11-14 – 2013-11-19 (×2): 0.1 mg via ORAL
  Filled 2013-11-13 (×2): qty 1

## 2013-11-13 MED ORDER — IBUPROFEN 600 MG PO TABS
600.0000 mg | ORAL_TABLET | Freq: Four times a day (QID) | ORAL | Status: DC | PRN
  Administered 2013-11-14 – 2013-11-16 (×2): 600 mg via ORAL
  Filled 2013-11-13 (×2): qty 1

## 2013-11-13 MED ORDER — HYDROXYZINE HCL 25 MG PO TABS
25.0000 mg | ORAL_TABLET | Freq: Four times a day (QID) | ORAL | Status: DC | PRN
  Administered 2013-11-14 – 2013-11-20 (×4): 25 mg via ORAL
  Filled 2013-11-13 (×3): qty 1
  Filled 2013-11-13: qty 40
  Filled 2013-11-13 (×3): qty 1

## 2013-11-13 NOTE — Progress Notes (Signed)
Patient bent over double in hallway, complaining of being in pain all over. Asking for medication. Patient claims to have used heroin three days ago and abused "Roxis" yesterday. Patient's UDS is negative for opiates. Patient is asking for medication for his pain. Refused Colace due to claims of having diarrhea. Nicotine patch given. Will continue to monitor. Billy CoastGoodman, Keelia Graybill K, RN

## 2013-11-13 NOTE — Consult Note (Signed)
  Review of Systems  Constitutional: Negative.   HENT: Negative.   Respiratory: Negative.   Cardiovascular: Negative.   Gastrointestinal: Negative.   Genitourinary: Negative.   Musculoskeletal: Negative.   Skin: Negative.   Neurological: Negative.   Endo/Heme/Allergies: Negative.   Psychiatric/Behavioral: Positive for depression, suicidal ideas and substance abuse.

## 2013-11-13 NOTE — Progress Notes (Signed)
D    Pt has been in bed asleep since the beginning of the shift tonight   When his name was called he became irritable and told staff to leave him alone   Pt did not come for his medications and did not wake up to receive medications  A   Verbal support given   Medications offered   Monitor Q 15 R   Pt in bed safe at present

## 2013-11-13 NOTE — [facility] Counselor (Signed)
Per [facility], pt has been accepted to bed 507-2. [facility]. Originals placed in pt's chart.   Evette Cristalaroline Paige Shawndrea Rutkowski, [place] Assessment Counselor

## 2013-11-13 NOTE — Progress Notes (Signed)
Pt was given NA meeting schedule  And this writer explained the purpose of this material.

## 2013-11-13 NOTE — Progress Notes (Signed)
Recreation Therapy Notes  Animal-Assisted Activity/Therapy (AAA/T) Program Checklist/Progress Notes Patient Eligibility Criteria Checklist & Daily Group note for Rec Tx Intervention  Date: 08.18.2015 Time: 2:45pm Location: [address] Dayroom    AAA/T Program Assumption of Risk Form signed by Patient/ or Parent Legal Guardian yes  Patient is free of allergies or sever asthma yes  Patient reports no fear of animals yes  Patient reports no history of cruelty to animals yes   Patient understands his/her participation is voluntary yes  Behavioral Response: Did not attend.   Tatem Holsonback L Temesha Queener, LRT/CTRS        Rahil Passey L 11/13/2013 4:28 PM 

## 2013-11-13 NOTE — Consult Note (Signed)
[facility] Face-to-Face Psychiatry Consult   Reason for Consult:  Suicidal and requesting opioid detox Referring Physician:  ER MD  Mason Jones is an 28 y.o. male. Total Time spent with patient: 45 minutes  Assessment: AXIS I:  Major Depression, Recurrent severe and opioid dependence AXIS II:  Deferred AXIS III:   Past Medical History  Diagnosis Date  . Drug abuse   . Anxiety    AXIS IV:  economic problems, housing problems, occupational problems and other psychosocial or environmental problems AXIS V:  41-50 serious symptoms  Plan:  Recommend psychiatric Inpatient admission when medically cleared.  Subjective:   Mason Jones is a 28 y.o. male patient admitted with suicidal threats and requesting opioid detox.  HPI:  Mr Mason Jones says he hears voices "telling me to get it over with, I'm not a proper father".  He was just in  [facility] a week or so ago but did not stop using.. He broke up with his fiancee who is expecting their baby.  "I feel hopeless".  Using heroin and crystal meth daily as well as smoking pot and drinking alcohol regularly. HPI Elements:   Location:  depression and chronic addiction. Quality:  hopeless and cannot stop using. Severity:  suicidal. Timing:  chronic addiction and breakup with girlfriend. Duration:  at least a year. Context:  as above.  Past Psychiatric History: Past Medical History  Diagnosis Date  . Drug abuse   . Anxiety     reports that he has been smoking Cigarettes.  He has been smoking about 1.00 pack per day. He does not have any smokeless tobacco history on file. He reports that he drinks alcohol. He reports that he uses illicit drugs (Marijuana, Benzodiazepines, Heroin, Oxycodone, and Hydrocodone). No family history on file. Family History Substance Abuse: Yes, Describe: Family Supports: No Living Arrangements: Other (Comment) (homeless) Can pt return to current living arrangement?: Yes   Allergies:  No Known Allergies  ACT Assessment Complete:   Yes:    Educational Status    Risk to Self: Risk to self with the past 6 months Suicidal Ideation: Yes-Currently Present Suicidal Intent: Yes-Currently Present Is patient at risk for suicide?: Yes Suicidal Plan?: Yes-Currently Present Specify Current Suicidal Plan: jump in traffic Access to Means: Yes Specify Access to Suicidal Means: traffic in environment What has been your use of drugs/alcohol within the last 12 months?: meth, opoids, thc, valium Previous Attempts/Gestures: Yes How many times?: 2 Triggers for Past Attempts: Hallucinations;Family contact;Other (Comment) (drug use) Intentional Self Injurious Behavior: None Family Suicide History: No Recent stressful life event(s): Conflict (Comment);Loss (Comment);Financial Problems;Legal Issues;Other (Comment) (drug use) Persecutory voices/beliefs?: Yes Depression: Yes Depression Symptoms: Insomnia;Isolating;Fatigue;Guilt;Loss of interest in usual pleasures;Feeling worthless/self pity;Feeling angry/irritable Substance abuse history and/or treatment for substance abuse?: Yes  Risk to Others: Risk to Others within the past 6 months Homicidal Ideation: No Thoughts of Harm to Others: No Current Homicidal Intent: No Current Homicidal Plan: No History of harm to others?: No Assessment of Violence: None Noted Does patient have access to weapons?: No Criminal Charges Pending?: Yes Describe Pending Criminal Charges: noice violation Does patient have a court date: Yes Court Date: 02/27/14  Abuse:    Prior Inpatient Therapy: Prior Inpatient Therapy Prior Inpatient Therapy: Yes Prior Therapy Dates: 2015, 2014, 2013 Prior Therapy Facilty/Provider(s): [facility], [facility], [place], [place] Reason for Treatment: detox  Prior Outpatient Therapy: Prior Outpatient Therapy Prior Outpatient Therapy: Yes Prior Therapy Facilty/Provider(s): Methadone clinic in [place] Reason for Treatment: methadone  tx  Additional Information:  Additional Information 1:1 In Past 12 Months?: No CIRT Risk: No Elopement Risk: No Does patient have medical clearance?: Yes                  Objective: Blood pressure 115/40, pulse 61, temperature 98.7 F (37.1 C), temperature source Oral, resp. rate 16, SpO2 100.00%.There is no weight on file to calculate BMI. Results for orders placed during the hospital encounter of 11/12/13 (from the past 72 hour(s))  ACETAMINOPHEN LEVEL     Status: None   Collection Time    11/12/13  5:34 PM      Result Value Ref Range   Acetaminophen (Tylenol), Serum <15.0  10 - 30 ug/mL   Comment:            THERAPEUTIC CONCENTRATIONS VARY     SIGNIFICANTLY. A RANGE OF 10-30     ug/mL MAY BE AN EFFECTIVE     CONCENTRATION FOR MANY PATIENTS.     HOWEVER, SOME ARE BEST TREATED     AT CONCENTRATIONS OUTSIDE THIS     RANGE.     ACETAMINOPHEN CONCENTRATIONS     >150 ug/mL AT 4 HOURS AFTER     INGESTION AND >50 ug/mL AT 12     HOURS AFTER INGESTION ARE     OFTEN ASSOCIATED WITH TOXIC     REACTIONS.  CBC     Status: None   Collection Time    11/12/13  5:34 PM      Result Value Ref Range   WBC 7.4  4.0 - 10.5 K/uL   RBC 4.57  4.22 - 5.81 MIL/uL   Hemoglobin 14.5  13.0 - 17.0 g/dL   HCT 42.7  39.0 - 52.0 %   MCV 93.4  78.0 - 100.0 fL   MCH 31.7  26.0 - 34.0 pg   MCHC 34.0  30.0 - 36.0 g/dL   RDW 12.8  11.5 - 15.5 %   Platelets 295  150 - 400 K/uL  COMPREHENSIVE METABOLIC PANEL     Status: Abnormal   Collection Time    11/12/13  5:34 PM      Result Value Ref Range   Sodium 143  137 - 147 mEq/L   Potassium 4.4  3.7 - 5.3 mEq/L   Chloride 105  96 - 112 mEq/L   CO2 26  19 - 32 mEq/L   Glucose, Bld 96  70 - 99 mg/dL   BUN 12  6 - 23 mg/dL   Creatinine, Ser 1.00  0.50 - 1.35 mg/dL   Calcium 9.4  8.4 - 10.5 mg/dL   Total Protein 7.3  6.0 - 8.3 g/dL   Albumin 4.2  3.5 - 5.2 g/dL   AST 22  0 - 37 U/L   ALT 12  0 - 53 U/L   Alkaline Phosphatase 66  39 - 117 U/L   Total Bilirubin <0.2 (*)  0.3 - 1.2 mg/dL   GFR calc non Af Amer >90  >90 mL/min   GFR calc Af Amer >90  >90 mL/min   Comment: (NOTE)     The eGFR has been calculated using the CKD EPI equation.     This calculation has not been validated in all clinical situations.     eGFR's persistently <90 mL/min signify possible Chronic Kidney     Disease.   Anion gap 12  5 - 15  ETHANOL     Status: None   Collection Time  11/12/13  5:34 PM      Result Value Ref Range   Alcohol, Ethyl (B) <11  0 - 11 mg/dL   Comment:            LOWEST DETECTABLE LIMIT FOR     SERUM ALCOHOL IS 11 mg/dL     FOR MEDICAL PURPOSES ONLY  SALICYLATE LEVEL     Status: Abnormal   Collection Time    11/12/13  5:34 PM      Result Value Ref Range   Salicylate Lvl <2.0 (*) 2.8 - 20.0 mg/dL  URINE RAPID DRUG SCREEN (HOSP PERFORMED)     Status: Abnormal   Collection Time    11/12/13  6:32 PM      Result Value Ref Range   Opiates NONE DETECTED  NONE DETECTED   Cocaine NONE DETECTED  NONE DETECTED   Benzodiazepines POSITIVE (*) NONE DETECTED   Amphetamines NONE DETECTED  NONE DETECTED   Tetrahydrocannabinol POSITIVE (*) NONE DETECTED   Barbiturates POSITIVE (*) NONE DETECTED   Comment:            DRUG SCREEN FOR MEDICAL PURPOSES     ONLY.  IF CONFIRMATION IS NEEDED     FOR ANY PURPOSE, NOTIFY LAB     WITHIN 5 DAYS.                LOWEST DETECTABLE LIMITS     FOR URINE DRUG SCREEN     Drug Class       Cutoff (ng/mL)     Amphetamine      1000     Barbiturate      200     Benzodiazepine   200     Tricyclics       300     Opiates          300     Cocaine          300     THC              50   Labs are reviewed and are pertinent for benzodiazepines, THC and barbiturates..  Current Facility-Administered Medications  Medication Dose Route Frequency Provider Last Rate Last Dose  . acetaminophen (TYLENOL) tablet 650 mg  650 mg Oral Q4H PRN Elwin Mocha, MD      . alum & mag hydroxide-simeth (MAALOX/MYLANTA) 200-200-20 MG/5ML suspension 30  mL  30 mL Oral PRN Elwin Mocha, MD      . dicyclomine (BENTYL) tablet 20 mg  20 mg Oral Q6H PRN Elwin Mocha, MD   20 mg at 11/13/13 1292  . docusate sodium (COLACE) capsule 100 mg  100 mg Oral BID Elwin Mocha, MD   100 mg at 11/12/13 2116  . hydrOXYzine (ATARAX/VISTARIL) tablet 25 mg  25 mg Oral Q6H PRN Elwin Mocha, MD   25 mg at 11/13/13 9090  . ibuprofen (ADVIL,MOTRIN) tablet 600 mg  600 mg Oral Q8H PRN Elwin Mocha, MD   600 mg at 11/13/13 1006  . LORazepam (ATIVAN) tablet 1 mg  1 mg Oral Q8H PRN Elwin Mocha, MD   1 mg at 11/13/13 3014  . mirtazapine (REMERON) tablet 30 mg  30 mg Oral QHS Elwin Mocha, MD   30 mg at 11/12/13 2116  . naproxen (NAPROSYN) tablet 500 mg  500 mg Oral BID PRN Elwin Mocha, MD   500 mg at 11/13/13 9969  . nicotine (NICODERM CQ - dosed in mg/24 hours) patch 21 mg  21 mg  Transdermal Daily Evelina Bucy, MD   21 mg at 11/12/13 2012  . ondansetron (ZOFRAN) tablet 4 mg  4 mg Oral Q8H PRN Evelina Bucy, MD      . prazosin (MINIPRESS) capsule 1 mg  1 mg Oral QHS Evelina Bucy, MD   1 mg at 11/12/13 2116  . sertraline (ZOLOFT) tablet 25 mg  25 mg Oral Daily Evelina Bucy, MD   25 mg at 11/13/13 1021  . zolpidem (AMBIEN) tablet 5 mg  5 mg Oral QHS PRN Evelina Bucy, MD   5 mg at 11/12/13 2116   Current Outpatient Prescriptions  Medication Sig Dispense Refill  . dicyclomine (BENTYL) 20 MG tablet Take 1 tablet (20 mg total) by mouth every 6 (six) hours as needed for spasms (abdominal cramping).  12 tablet  0  . docusate sodium 100 MG CAPS Take 100 mg by mouth 2 (two) times daily. (May buy from over the counter at the pharmacy): For constipation  60 capsule  0  . hydrOXYzine (ATARAX/VISTARIL) 25 MG tablet Take 1 tablet (25 mg total) by mouth every 6 (six) hours as needed for anxiety.  30 tablet  0  . methylphenidate (DAYTRANA) 30 MG/9HR Place 1 patch onto the skin daily. wear patch for 9 hours only each day      . mirtazapine (REMERON) 30 MG tablet Take 1 tablet (30 mg total) by  mouth at bedtime.  7 tablet  0  . naproxen (NAPROSYN) 500 MG tablet Take 1 tablet (500 mg total) by mouth 2 (two) times daily as needed (aching, pain, or discomfort).  14 tablet  0  . prazosin (MINIPRESS) 1 MG capsule Take 1 mg by mouth at bedtime. For PTSD related nightmares      . sertraline (ZOLOFT) 25 MG tablet Take 1 tablet (25 mg total) by mouth daily.  7 tablet  0    Psychiatric Specialty Exam:     Blood pressure 115/40, pulse 61, temperature 98.7 F (37.1 C), temperature source Oral, resp. rate 16, SpO2 100.00%.There is no weight on file to calculate BMI.  General Appearance: Casual  Eye Contact::  Minimal  Speech:  Clear and Coherent  Volume:  Decreased  Mood:  Depressed  Affect:  Congruent  Thought Process:  Coherent  Orientation:  Full (Time, Place, and Person)  Thought Content:  Hallucinations: Auditory Command:  to kill myself  Suicidal Thoughts:  Yes.  with intent/plan  Homicidal Thoughts:  No  Memory:  Immediate;   Good Recent;   Good Remote;   Good  Judgement:  Fair  Insight:  Lacking  Psychomotor Activity:  Normal  Concentration:  Good  Recall:  Good  Fund of Knowledge:Good  Language: Good  Akathisia:  Negative  Handed:  Right  AIMS (if indicated):     Assets:  Communication Skills Desire for Improvement  Sleep:      Musculoskeletal: Strength & Muscle Tone: within normal limits Gait & Station: normal Patient leans: N/A  Treatment Plan Summary: will seek inpatient bed for treatment of depression with suicidal ideation  Phelix Fudala D 11/13/2013 1:00 PM

## 2013-11-13 NOTE — Tx Team (Signed)
Initial Interdisciplinary Treatment Plan   PATIENT STRESSORS: Substance abuse   PROBLEM LIST: Problem List/Patient Goals Date to be addressed Date deferred Reason deferred Estimated date of resolution  Substance Abuse 11/13/2013           Depression 11/13/2013           Risk for Suicide 11/13/2013                              DISCHARGE CRITERIA:  Improved stabilization in mood, thinking, and/or behavior Motivation to continue treatment in a less acute level of care Reduction of life-threatening or endangering symptoms to within safe limits  PRELIMINARY DISCHARGE PLAN: Attend 12-step recovery group Outpatient therapy  PATIENT/FAMIILY INVOLVEMENT: This treatment plan has been presented to and reviewed with the patient, Mason Jones.  The patient and family have been given the opportunity to ask questions and make suggestions.  Marzetta BoardDopson, Silver Parkey E 11/13/2013, 3:52 PM

## 2013-11-13 NOTE — Progress Notes (Signed)
Patient ID: Mason Jones, male   DOB: 01/07/1986, 28 y.o.   MRN: [id number]  Mason HeirJames Jones is a 28 year old [facility] and suicidal ideation. Patient reports that he used crystal meth for four days and then switched to using heroin. Patient reports that he is also having SI with a plan to, "step out in front of a car." Patient reports that he is hearing a single male voice that is whispering command hallucinations. Patient can contract for safety at this time. Patient denies HI and visual hallucinations. Patient has no pertinent medical history besides depression and drug abuse. Patient's UDS was positive for THC, Barbituates, and Benzos. Patient reports major weight loss due to drug use and loss of appetite. His chart reflects loss of 13 pounds in 10 days. Patient was given ginger ale and saltines to help settle his stomach. Patient reports generalized pain of 9/10 and is clearly uncomfortable during admission. Patient verbalized understanding of the treatment agreement and the admission process. Q15 minute safety checks were initiated and are being maintained. Patient is safe at this time.

## 2013-11-14 ENCOUNTER — Encounter (HOSPITAL_COMMUNITY): Payer: Self-pay | Admitting: Psychiatry

## 2013-11-14 DIAGNOSIS — F1920 Other psychoactive substance dependence, uncomplicated: Secondary | ICD-10-CM

## 2013-11-14 DIAGNOSIS — F1120 Opioid dependence, uncomplicated: Secondary | ICD-10-CM | POA: Diagnosis present

## 2013-11-14 DIAGNOSIS — F411 Generalized anxiety disorder: Secondary | ICD-10-CM

## 2013-11-14 DIAGNOSIS — R45851 Suicidal ideations: Secondary | ICD-10-CM

## 2013-11-14 DIAGNOSIS — F1310 Sedative, hypnotic or anxiolytic abuse, uncomplicated: Secondary | ICD-10-CM | POA: Diagnosis present

## 2013-11-14 DIAGNOSIS — F332 Major depressive disorder, recurrent severe without psychotic features: Secondary | ICD-10-CM

## 2013-11-14 MED ORDER — LORAZEPAM 1 MG PO TABS
1.0000 mg | ORAL_TABLET | Freq: Every day | ORAL | Status: AC
  Administered 2013-11-18: 1 mg via ORAL
  Filled 2013-11-14: qty 1

## 2013-11-14 MED ORDER — LORAZEPAM 1 MG PO TABS
1.0000 mg | ORAL_TABLET | Freq: Four times a day (QID) | ORAL | Status: AC
  Administered 2013-11-14 – 2013-11-15 (×3): 1 mg via ORAL
  Filled 2013-11-14 (×2): qty 1

## 2013-11-14 MED ORDER — CHLORDIAZEPOXIDE HCL 25 MG PO CAPS: 25.0000 mg | ORAL_CAPSULE | Freq: Three times a day (TID) | ORAL | Status: DC | PRN

## 2013-11-14 MED ORDER — NICOTINE POLACRILEX 2 MG MT GUM
CHEWING_GUM | OROMUCOSAL | Status: AC
  Filled 2013-11-14: qty 1

## 2013-11-14 MED ORDER — VITAMIN B-1 100 MG PO TABS
100.0000 mg | ORAL_TABLET | Freq: Every day | ORAL | Status: DC
  Administered 2013-11-15 – 2013-11-21 (×7): 100 mg via ORAL
  Filled 2013-11-14 (×9): qty 1

## 2013-11-14 MED ORDER — LORAZEPAM 1 MG PO TABS
1.0000 mg | ORAL_TABLET | Freq: Two times a day (BID) | ORAL | Status: AC
  Administered 2013-11-16 – 2013-11-17 (×2): 1 mg via ORAL
  Filled 2013-11-14 (×3): qty 1

## 2013-11-14 MED ORDER — ADULT MULTIVITAMIN W/MINERALS CH
1.0000 | ORAL_TABLET | Freq: Every day | ORAL | Status: DC
  Administered 2013-11-15 – 2013-11-21 (×7): 1 via ORAL
  Filled 2013-11-14 (×10): qty 1

## 2013-11-14 MED ORDER — LORAZEPAM 1 MG PO TABS
1.0000 mg | ORAL_TABLET | Freq: Three times a day (TID) | ORAL | Status: AC
  Administered 2013-11-15 – 2013-11-16 (×3): 1 mg via ORAL
  Filled 2013-11-14 (×2): qty 1

## 2013-11-14 MED ORDER — NICOTINE POLACRILEX 2 MG MT GUM
2.0000 mg | CHEWING_GUM | OROMUCOSAL | Status: DC | PRN
  Administered 2013-11-14 – 2013-11-21 (×20): 2 mg via ORAL
  Filled 2013-11-14 (×10): qty 1

## 2013-11-14 MED ORDER — THIAMINE HCL 100 MG/ML IJ SOLN: 100.0000 mg | Freq: Once | INTRAMUSCULAR | Status: DC

## 2013-11-14 MED ORDER — LORAZEPAM 1 MG PO TABS
1.0000 mg | ORAL_TABLET | Freq: Four times a day (QID) | ORAL | Status: DC | PRN
Start: 2013-11-14 — End: 2013-11-17
  Administered 2013-11-14 – 2013-11-17 (×4): 1 mg via ORAL
  Filled 2013-11-14 (×4): qty 1

## 2013-11-14 MED ORDER — LORAZEPAM 1 MG PO TABS
ORAL_TABLET | ORAL | Status: AC
  Filled 2013-11-14: qty 1

## 2013-11-14 NOTE — Progress Notes (Signed)
D: Patient has blunted, irritable affect and mood. Declined self inventory sheet today. Writer observed the patient pacing in the hallway with pressured speech, appearing angry and agitated. He verbalized that one of the medications is not working for him, specifically the clonidine. Patient rated anxiety "10". Compliant with some, but not all medications.  A: Support and encouragement provided to patient. Maintain Q15 minute checks for safety.  R: Patient receptive. Passive SI, but contracts for safety. Denies HI and AVH. Patient remains safe.

## 2013-11-14 NOTE — Progress Notes (Signed)
RN 1:1 Note  D: Pt in bed resting. Pt's respirations even and unlabored. Pt appears to be in no signs of distress at this time.  A: 1:1 observation continues for this pt. R: Pt remains safe at this time.

## 2013-11-14 NOTE — [facility] Group Notes (Signed)
[facility] LCSW Group Therapy 11/14/2013  1:15 PM   Type of Therapy: Group Therapy  Participation Level: Did Not Attend.   Samuella BruinKristin Monroe Toure, MSW, [employer]LCSWA Clinical Social Worker [facility] [phone]

## 2013-11-14 NOTE — [facility] Suicide Risk Assessment (Signed)
Suicide Risk Assessment  Admission Assessment     Nursing information obtained from:  Patient Demographic factors:  Male;Caucasian;Unemployed Current Mental Status:  Suicidal ideation indicated by patient;Self-harm thoughts;Suicide plan Loss Factors:  Loss of significant relationship;Financial problems / change in socioeconomic status Historical Factors:  Family history of mental illness or substance abuse;Impulsivity Risk Reduction Factors:  Responsible for children under 818 years of age Total Time spent with patient: 45 minutes  CLINICAL FACTORS:   Depression:   Comorbid alcohol abuse/dependence Alcohol/Substance Abuse/Dependencies  Psychiatric Specialty Exam:     Blood pressure 108/63, pulse 71, temperature 97.6 F (36.4 C), temperature source Oral, resp. rate 18, height 5\' 10"  (1.778 m), weight 62.143 kg (137 lb), SpO2 100.00%.Body mass index is 19.66 kg/(m^2).  General Appearance: Disheveled  Eye SolicitorContact::  Fair  Speech:  Clear and Coherent, Slow and not spontaneous  Volume:  Decreased  Mood:  Anxious and "withdrawing" in pain  Affect:  anxious restless agitated  Thought Process:  Coherent, Goal Directed and not spontaneous content  Orientation:  Full (Time, Place, and Person)  Thought Content:  events symptoms worries concerns  Suicidal Thoughts:  Yes.  without intent/plan  Homicidal Thoughts:  No  Memory:  Immediate;   Poor Recent;   Poor Remote;   Fair  Judgement:  Fair  Insight:  Present  Psychomotor Activity:  Restlessness and shaky restless  Concentration:  Poor  Recall:  [employer]  Fund of Knowledge:NA  Language: Fair  Akathisia:  No  Handed:    AIMS (if indicated):     Assets:  Desire for Improvement  Sleep:  Number of Hours: 6.75   Musculoskeletal: Strength & Muscle Tone: within normal limits Gait & Station: normal Patient leans: N/A  COGNITIVE FEATURES THAT CONTRIBUTE TO RISK:  Closed-mindedness Polarized thinking Thought constriction (tunnel vision)     SUICIDE RISK:   Moderate:  Frequent suicidal ideation with limited intensity, and duration, some specificity in terms of plans, no associated intent, good self-control, limited dysphoria/symptomatology, some risk factors present, and identifiable protective factors, including available and accessible social support.  PLAN OF CARE: Supportive approach/coping skills/relapse prevention                               Detox/address the co morbities  I certify that inpatient services furnished can reasonably be expected to improve the patient's condition.  Gumecindo Hopkin A 11/14/2013, 2:41 PM
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
## 2013-11-14 NOTE — [facility] Group Notes (Signed)
[facility]  11/14/2013  10:30 AM  Participation Quality: Did Not Attend - patient observed sleeping in room.  Samuella BruinKristin Blaise Grieshaber, MSW, [employer]CSWA Clinical Social Worker [facility] [phone]

## 2013-11-14 NOTE — Progress Notes (Signed)
Patient was pacing up and down the hallway, pounding his fist, verbalizing that he feels like he may hurt himself or someone else if he is left alone; also voicing that he can not contract for safety, which is why he's letting staff know now. Writer spoke with Dr. Dub MikesLugo and received an order to initiate 1:1 patient observation.

## 2013-11-15 DIAGNOSIS — F1320 Sedative, hypnotic or anxiolytic dependence, uncomplicated: Secondary | ICD-10-CM

## 2013-11-15 DIAGNOSIS — F1994 Other psychoactive substance use, unspecified with psychoactive substance-induced mood disorder: Secondary | ICD-10-CM

## 2013-11-15 MED ORDER — OLANZAPINE 10 MG PO TBDP
10.0000 mg | ORAL_TABLET | Freq: Every day | ORAL | Status: DC
  Administered 2013-11-16 – 2013-11-19 (×3): 10 mg via ORAL
  Filled 2013-11-15 (×5): qty 1
  Filled 2013-11-15: qty 14
  Filled 2013-11-15 (×3): qty 1
  Filled 2013-11-15: qty 14

## 2013-11-15 MED ORDER — BENZTROPINE MESYLATE 1 MG PO TABS
1.0000 mg | ORAL_TABLET | Freq: Once | ORAL | Status: AC
  Administered 2013-11-15: 1 mg via ORAL
  Filled 2013-11-15 (×2): qty 1

## 2013-11-15 MED ORDER — OLANZAPINE 5 MG PO TBDP
5.0000 mg | ORAL_TABLET | Freq: Two times a day (BID) | ORAL | Status: DC
  Administered 2013-11-15 – 2013-11-21 (×12): 5 mg via ORAL
  Filled 2013-11-15 (×5): qty 1
  Filled 2013-11-15: qty 28
  Filled 2013-11-15 (×10): qty 1
  Filled 2013-11-15: qty 28

## 2013-11-15 MED ORDER — HALOPERIDOL LACTATE 5 MG/ML IJ SOLN
5.0000 mg | Freq: Once | INTRAMUSCULAR | Status: AC
  Administered 2013-11-15: 5 mg via INTRAMUSCULAR
  Filled 2013-11-15: qty 1

## 2013-11-15 MED ORDER — SERTRALINE HCL 50 MG PO TABS
50.0000 mg | ORAL_TABLET | Freq: Every day | ORAL | Status: DC
  Administered 2013-11-16 – 2013-11-21 (×6): 50 mg via ORAL
  Filled 2013-11-15 (×7): qty 1
  Filled 2013-11-15: qty 14

## 2013-11-15 MED ORDER — HALOPERIDOL 5 MG PO TABS
5.0000 mg | ORAL_TABLET | Freq: Once | ORAL | Status: AC
  Administered 2013-11-15: 5 mg via ORAL
  Filled 2013-11-15 (×2): qty 1

## 2013-11-15 MED ORDER — ENSURE COMPLETE PO LIQD
237.0000 mL | Freq: Two times a day (BID) | ORAL | Status: DC
  Administered 2013-11-15 – 2013-11-19 (×7): 237 mL via ORAL

## 2013-11-15 MED ORDER — TOPIRAMATE 25 MG PO TABS
25.0000 mg | ORAL_TABLET | Freq: Two times a day (BID) | ORAL | Status: DC
  Administered 2013-11-15 – 2013-11-21 (×12): 25 mg via ORAL
  Filled 2013-11-15 (×2): qty 1
  Filled 2013-11-15: qty 28
  Filled 2013-11-15 (×5): qty 1
  Filled 2013-11-15: qty 28
  Filled 2013-11-15 (×8): qty 1

## 2013-11-15 MED ORDER — NICOTINE POLACRILEX 2 MG MT GUM
CHEWING_GUM | OROMUCOSAL | Status: AC
  Filled 2013-11-15: qty 1

## 2013-11-15 NOTE — Progress Notes (Signed)
0900 nursing orientation group    The focus of this group is to educate the patient on the purpose and policies of crisis stabilization and provide a format to answer questions about their admission.  The group details unit policies and expectations of patients while admitted.  Pt did attend group but did not elaborate on any topic.

## 2013-11-15 NOTE — Progress Notes (Signed)
1:1 Nursing Note  D: Patient resting in bed at this time; no s/s of distress noted; even labored breathing.  A: Monitor Q15 minute checks and continuous patient observation.  R: Patient remains safe on the unit.

## 2013-11-15 NOTE — Progress Notes (Signed)
Adult Psychoeducational Group Note  Date:  11/15/2013 Time:  11:00 PM  Group Topic/Focus:  Wrap-Up Group:   The focus of this group is to help patients review their daily goal of treatment and discuss progress on daily workbooks.  Participation Level:  None  Participation Quality:  Drowsy  Affect:  Blunted  Cognitive:  Confused  Insight: Limited  Engagement in Group:  none  Modes of Intervention: None   Additional Comments:  Pt was sleep during group   Kynzee Devinney R 11/15/2013, 11:00 PM

## 2013-11-15 NOTE — Progress Notes (Signed)
D: Patient continues to have blunted, irritable affect and anxious mood. He reported on the self inventory sheet that sleep and ability to concentrate are both poor, fair appetite and low energy level. Writer observed patient lying in bed the majority of the day and has attended one group. Patient adheres to all medications.  A: Support and encouragement provided to patient. Administered scheduled medications per ordering MD. Monitor Q15 minute checks for safety.  R: Patient receptive. Constant SI and patient has sitter with him for close observation. Denies HI and visual hallucinations, but endorses auditory hallucinations. Patient remains safe on the unit.

## 2013-11-15 NOTE — Progress Notes (Signed)
1:1 Nursing Note  D: Patient resting in bed, calm and less agitated; no s/s of distress noted. Affect is flat and mood is depressed.  A: 1:1 patient observation with sitter and Q15 minute checks for safety.  R: Patient safe on the unit.

## 2013-11-15 NOTE — Progress Notes (Signed)
Adult Psychoeducational Group Note  Date:  11/15/2013 Time:  10:00am Group Topic/Focus:  Making Healthy Choices:   The focus of this group is to help patients identify negative/unhealthy choices they were using prior to admission and identify positive/healthier coping strategies to replace them upon discharge.  Participation Level:  Did Not Attend  Participation Quality:    Affect:    Cognitive:   Insight:   Engagement in Group:    Modes of Intervention:   Additional Comments: Pt did not attend group   Shelly BombardGarner, Camia Dipinto D 11/15/2013, 10:47 AM

## 2013-11-15 NOTE — Progress Notes (Addendum)
Pt remains a 1:1 and is lying in the bed with the covers pulled over his head.He was awake and asked the nurse what time he could have more ativan. Pt was instructed that he is do for more medication at 5pm.he does contract for safety and denies SI and HI. Pt does remain safe. 5:30pm _pt c/o feeling dizzy and lightheaded when he gets up. Clonodine held.

## 2013-11-15 NOTE — Progress Notes (Signed)
D: Pt reports having shakiness, anxiety, HA, sweating, and abdominal cramps. Pt is denying any SI/HI/AVH. Pt was guarded and blunted this evening. Pt presented with bradycardia this evening.  A: Writer administered scheduled and prn medications that was suitable for this pt's symptoms and presenting vitals. Continued support and availability as needed was extended to this pt. Staff continue to monitor pt with q3015min checks.  R: No adverse drug reactions noted. Pt receptive to treatment. Pt remains safe at this time.

## 2013-11-15 NOTE — Progress Notes (Signed)
RN 1:1 Note D: Pt in bed resting with eyes closed. Respirations even and unlabored. Pt appears to be in no signs of distress at this time.  A: 1:1 observation remains for this pt.  R: Pt remains safe at this time.  

## 2013-11-15 NOTE — Progress Notes (Addendum)
[facility] MD Progress Note  11/15/2013 11:56 AM Mason Jones  MRN:  [id number] Subjective:  Mason Jones stated that he hears an insulting voice mostly inside his head. States he cant deal with it anymore. States that even when he abstained last time he was here, he still heard the voice. States he did not share this information before as he did not want people to think that he "was crazy." He says he was given Haldol in the past and seems it was helpful. States he had been extremely overwhelmed and feeling like he wants to hurt himself.  Diagnosis:   DSM5: Trauma-Stressor Disorders:  Posttraumatic Stress Disorder (309.81) Substance/Addictive Disorders:  Opioid Disorder - Severe (304.00),Benzodiazepine Dependence Depressive Disorders:  Major Depressive Disorder - with Psychotic Features (296.24) Total Time spent with patient: 30 minutes  Axis I: Substance Induced Mood Disorder  ADL's:  Intact  Sleep: Fair  Appetite:  Poor   Psychiatric Specialty Exam: Physical Exam  Review of Systems  Constitutional: Positive for malaise/fatigue.  HENT: Negative.   Eyes: Negative.   Respiratory: Negative.   Cardiovascular: Negative.   Gastrointestinal: Negative.   Genitourinary: Negative.   Musculoskeletal: Negative.   Skin: Negative.   Neurological: Positive for weakness.  Endo/Heme/Allergies: Negative.   Psychiatric/Behavioral: Positive for depression, suicidal ideas and substance abuse. The patient is nervous/anxious.     Blood pressure 119/72, pulse 71, temperature 97.8 F (36.6 C), temperature source Oral, resp. rate 16, height 5\' 10"  (1.778 m), weight 62.143 kg (137 lb), SpO2 100.00%.Body mass index is 19.66 kg/(m^2).  General Appearance: Disheveled  Eye Solicitor::  Fair  Speech:  Clear and Coherent, Slow and not spontaneous  Volume:  Decreased  Mood:  Anxious, Depressed and Dysphoric  Affect:  Restricted  Thought Process:  Coherent and Goal Directed  Orientation:  Full (Time, Place, and Person)   Thought Content:  symptoms worries concerns fear of losing control  Suicidal Thoughts:  Yes.  without intent/plan  Homicidal Thoughts:  No  Memory:  Immediate;   Fair Recent;   Fair Remote;   Fair  Judgement:  Fair  Insight:  Present  Psychomotor Activity:  Restlessness  Concentration:  Poor  Recall:  [employer] of Knowledge:NA  Language: Fair  Akathisia:  No  Handed:    AIMS (if indicated):     Assets:  Desire for Improvement  Sleep:  Number of Hours: 5.25   Musculoskeletal: Strength & Muscle Tone: within normal limits Gait & Station: normal Patient leans: N/A  Current Medications: Current Facility-Administered Medications  Medication Dose Route Frequency Provider Last Rate Last Dose  . acetaminophen (TYLENOL) tablet 650 mg  650 mg Oral Q6H PRN Benjaman Pott, MD   650 mg at 11/14/13 2325  . alum & mag hydroxide-simeth (MAALOX/MYLANTA) 200-200-20 MG/5ML suspension 30 mL  30 mL Oral Q4H PRN Benjaman Pott, MD      . cloNIDine (CATAPRES) tablet 0.1 mg  0.1 mg Oral QID Kerry Hough, PA-C       Followed by  . [START ON 11/16/2013] cloNIDine (CATAPRES) tablet 0.1 mg  0.1 mg Oral BH-qamhs Spencer E Simon, PA-C       Followed by  . [START ON 11/19/2013] cloNIDine (CATAPRES) tablet 0.1 mg  0.1 mg Oral QAC breakfast Kerry Hough, PA-C   0.1 mg at 11/14/13 4098  . dicyclomine (BENTYL) tablet 20 mg  20 mg Oral Q6H PRN Kerry Hough, PA-C   20 mg at 11/14/13 2326  . docusate sodium (COLACE)  capsule 100 mg  100 mg Oral BID Benjaman PottGerald D Taylor, MD      . haloperidol lactate (HALDOL) injection 5 mg  5 mg Intramuscular Once Rachael FeeIrving A Mansur Patti, MD      . hydrOXYzine (ATARAX/VISTARIL) tablet 25 mg  25 mg Oral Q6H PRN Benjaman PottGerald D Taylor, MD   25 mg at 11/14/13 2326  . ibuprofen (ADVIL,MOTRIN) tablet 600 mg  600 mg Oral Q6H PRN Kerry HoughSpencer E Simon, PA-C   600 mg at 11/14/13 [id number]  . loperamide (IMODIUM) capsule 2-4 mg  2-4 mg Oral PRN Kerry HoughSpencer E Simon, PA-C      . LORazepam (ATIVAN) tablet 1 mg  1 mg  Oral Q6H PRN Sanjuana KavaAgnes I Nwoko, NP   1 mg at 11/14/13 1828  . LORazepam (ATIVAN) tablet 1 mg  1 mg Oral QID Sanjuana KavaAgnes I Nwoko, NP   1 mg at 11/15/13 [id number]   Followed by  . LORazepam (ATIVAN) tablet 1 mg  1 mg Oral TID Sanjuana KavaAgnes I Nwoko, NP   1 mg at 11/15/13 1134   Followed by  . [START ON 11/16/2013] LORazepam (ATIVAN) tablet 1 mg  1 mg Oral BID Sanjuana KavaAgnes I Nwoko, NP       Followed by  . [START ON 11/18/2013] LORazepam (ATIVAN) tablet 1 mg  1 mg Oral Daily Sanjuana KavaAgnes I Nwoko, NP      . magnesium hydroxide (MILK OF MAGNESIA) suspension 30 mL  30 mL Oral Daily PRN Benjaman PottGerald D Taylor, MD      . methocarbamol (ROBAXIN) tablet 500 mg  500 mg Oral Q8H PRN Kerry HoughSpencer E Simon, PA-C   500 mg at 11/14/13 [id number]  . mirtazapine (REMERON) tablet 30 mg  30 mg Oral QHS Benjaman PottGerald D Taylor, MD   30 mg at 11/14/13 2325  . multivitamin with minerals tablet 1 tablet  1 tablet Oral Daily Sanjuana KavaAgnes I Nwoko, NP   1 tablet at 11/15/13 [id number]  . nicotine polacrilex (NICORETTE) gum 2 mg  2 mg Oral PRN Rachael FeeIrving A Masaki Rothbauer, MD   2 mg at 11/15/13 1136  . ondansetron (ZOFRAN-ODT) disintegrating tablet 4 mg  4 mg Oral Q6H PRN Kerry HoughSpencer E Simon, PA-C      . prazosin (MINIPRESS) capsule 1 mg  1 mg Oral QHS Benjaman PottGerald D Taylor, MD      . sertraline (ZOLOFT) tablet 25 mg  25 mg Oral Daily Benjaman PottGerald D Taylor, MD   25 mg at 11/15/13 [id number]  . thiamine (B-1) injection 100 mg  100 mg Intramuscular Once Sanjuana KavaAgnes I Nwoko, NP      . thiamine (VITAMIN B-1) tablet 100 mg  100 mg Oral Daily Sanjuana KavaAgnes I Nwoko, NP   100 mg at 11/15/13 [id number]    Lab Results: No results found for this or any previous visit (from the past 48 hour(s)).  Physical Findings: AIMS: Facial and Oral Movements Muscles of Facial Expression: None, normal Lips and Perioral Area: None, normal Jaw: None, normal Tongue: None, normal,Extremity Movements Upper (arms, wrists, hands, fingers): None, normal Lower (legs, knees, ankles, toes): None, normal, Trunk Movements Neck, shoulders, hips: None, normal, Overall Severity Severity  of abnormal movements (highest score from questions above): None, normal Incapacitation due to abnormal movements: None, normal Patient's awareness of abnormal movements (rate only patient's report): No Awareness, Dental Status Current problems with teeth and/or dentures?: No Does patient usually wear dentures?: No  CIWA:  CIWA-Ar Total: 9 COWS:  COWS Total Score: 10  Treatment Plan Summary: Daily contact with patient to assess and evaluate  symptoms and progress in treatment Medication management  Plan: Supportive approach/coping skills/relapse prevention           Continue detox           Haldol 5 mg po (trial basis)           Cogentin 1 mg po          Reassess the 1:1 OBS Note: after reassessment Larwence did not notice any improvement after the Haldol. Will try Zyprexa Zydis. He states he took Topamax and that it help him before           Will try Zyprexa Zydis 5 mg now and 10 mg HS. Topamax 25 mg BID           Will continue the 1:1 OBS at least until the AM after we see response to the Zyprexa Medical Decision Making Problem Points:  Established problem, worsening (2) and Review of psycho-social stressors (1) Data Points:  Review of medication regiment & side effects (2) Review of new medications or change in dosage (2)  I certify that inpatient services furnished can reasonably be expected to improve the patient's condition.   Lacole Komorowski A 11/15/2013, 11:56 AM

## 2013-11-15 NOTE — Progress Notes (Addendum)
RN 1:1 Note  D: Pt in bed resting with a towel covering his eyes. This is this writer's second attempt to speak with this pt. Pt reports that he is trying to sleep at this time. Pt reports that he would directly come to me when he is ready for his medications. Pt refrained from any further interaction with this Clinical research associatewriter. Pt remained in his room during the evening shift.  A: Continued support and availability as needed was extended to this pt. 1:1 observation continues for this pt.  R: Pt remains safe at this time.

## 2013-11-15 NOTE — [facility] Group Notes (Signed)
[facility] LCSW Group Therapy 11/15/2013  1:15 PM   Type of Therapy: Group Therapy  Participation Level: Did Not Attend.   Samuella BruinKristin Javaun Dimperio, MSW, [employer]CSWA Clinical Social Worker [facility] [phone]

## 2013-11-15 NOTE — Progress Notes (Signed)
NUTRITION ASSESSMENT  Pt identified as at risk on the Malnutrition Screen Tool  Pt meets criteria for severe MALNUTRITION in the context of social/environmental circumstances as evidenced by <50% estimated energy intake with 8.7% weight loss in the past 2 weeks.  INTERVENTION: 1. Educated patient on the importance of nutrition and encouraged intake of food and beverages. 2. Supplements: Ensure Complete BID  NUTRITION DIAGNOSIS: Unintentional weight loss related to sub-optimal intake as evidenced by pt report.   Goal: Pt to meet >/= 90% of their estimated nutrition needs.  Monitor:  PO intake  Assessment:  Pt admitted to [facility] voluntarily for detox and suicidal ideation. Patient reports that he used crystal meth for four days and then switched to using heroin. Patient reports that he is also having SI with a plan to, "step out in front of a car."  - Met with pt who reports not eating PTA, didn't elaborate on how long this went on for - Said he's lost 20 pounds in the past 2 months  - Reports eating some breakfast this morning - Agreeable to getting Ensure Complete  - Weight trend shows 13 pound unintended weight loss in the past 11 days   28 y.o. male  Height: Ht Readings from Last 1 Encounters:  11/13/13 5' 10" (1.778 m)    Weight: Wt Readings from Last 1 Encounters:  11/13/13 137 lb (62.143 kg)    Weight Hx: Wt Readings from Last 10 Encounters:  11/13/13 137 lb (62.143 kg)  11/02/13 150 lb (68.04 kg)  10/03/13 137 lb 8 oz (62.37 kg)    BMI:  Body mass index is 19.66 kg/(m^2). Pt meets criteria for normal weight based on current BMI.  Estimated Nutritional Needs: Kcal: 25-30 kcal/kg Protein: > 1 gram protein/kg Fluid: 1 ml/kcal  Diet Order: General Pt is also offered choice of unit snacks mid-morning and mid-afternoon.  Pt is eating as desired.   Lab results and medications reviewed. Getting multivitamin and thiamine.     MS, RD, LDN [phone]  Pager [phone] Weekend/After Hours Pager  

## 2013-11-15 NOTE — Progress Notes (Signed)
Adult Psychoeducational Group Note  Date:  11/15/2013 Time:  10:46 PM  Group Topic/Focus:  Wrap-Up Group:   The focus of this group is to help patients review their daily goal of treatment and discuss progress on daily workbooks.  Participation Level:  Did Not Attend  Participation Quality:  Drowsy  Affect:  Depressed and Flat  Cognitive:  Confused  Insight: None  Engagement in Group:  none   Modes of Intervention:  none   Additional Comments:  Pt was not in group... Pt was sleep during group   Kasumi Ditullio R 11/15/2013, 10:46 PM

## 2013-11-15 NOTE — Progress Notes (Signed)
RN 1:1 Note  D: Pt in bed resting in supine position. Pt's respirations even and unlabored. Pt appears to be in no signs of distress at this time.  A: 1:1 observation continues for this pt's safety. R: Pt remains safe at this time.

## 2013-11-16 DIAGNOSIS — F1310 Sedative, hypnotic or anxiolytic abuse, uncomplicated: Secondary | ICD-10-CM

## 2013-11-16 NOTE — Progress Notes (Signed)
[facility] Post 1:1 Observation Documentation  For the first (8) hours following discontinuation of 1:1 precautions, a progress note entry by nursing staff should be documented at least every 2 hours, reflecting the patient's behavior, condition, mood, and conversation.  Use the progress notes for additional entries.  Time 1:1 discontinued: 1103hrs   Patient's Behavior:  Patient is asleep in the bed  Patient's Condition:  Patient is laying in the bed and respirations are even unlabored  Patient's Conversation: No conversation at this and no acute needs at this time   Barton Fannyyson, Nicoya Friel M 11/16/2013, 3:14 PM

## 2013-11-16 NOTE — Progress Notes (Signed)
D: Pt in bed with the covers pulled over his head. Pt's respirations are even and unlabored. Pt appears to be in no signs of distress at this time.  A: 1:1 observation continues for this pt's safety. R: Pt remains safe at this time.

## 2013-11-16 NOTE — Progress Notes (Signed)
Pt never reported to Clinical research associatewriter for his QHS medications. Writer verbalized the importance of taking his medications to treat his current concern (AH).

## 2013-11-16 NOTE — Progress Notes (Signed)
[facility] MD Progress Note  11/16/2013 1:41 PM Mason Jones  MRN:  [id number] Subjective:Mason Jones is still having a hard time. He admits to hearing the voice inside his head. The voice insults and tells him to kill himself. It is a male voice and he has been hearing this voice since he went trough the abuse. He has tolerated the Zyprexa well so far. He states he wants to pursue further treatment as he states he will not be able to make iit otherwise. If he cant get to a residential treatment center he states he can try to get into a half way house and continue to work on his Recovery. He states that the motivation will still be to be part of his son's life. (his girlfriend will be willing to have him in their life's if he was to get "clean") Diagnosis:   DSM5: Trauma-Stressor Disorders:  Posttraumatic Stress Disorder (309.81) Substance/Addictive Disorders:  Opioid Disorder - Severe (304.00), Benzodiazepine Use Disorder Depressive Disorders:  Major Depressive Disorder - Severe (296.23) R/O psychotic features Total Time spent with patient: 30 minutes  Axis I: Substance Induced Mood Disorder  ADL's:  Intact  Sleep: Fair  Appetite:  Poor Psychiatric Specialty Exam: Physical Exam  Review of Systems  Constitutional: Positive for malaise/fatigue.  HENT: Negative.   Eyes: Negative.   Respiratory: Negative.   Cardiovascular: Negative.   Gastrointestinal: Negative.   Genitourinary: Negative.   Musculoskeletal: Negative.   Skin: Negative.   Neurological: Positive for weakness.  Endo/Heme/Allergies: Negative.   Psychiatric/Behavioral: Positive for depression and substance abuse. The patient is nervous/anxious and has insomnia.     Blood pressure 124/64, pulse 58, temperature 97.9 F (36.6 C), temperature source Oral, resp. rate 16, height 5\' 10"  (1.778 m), weight 62.143 kg (137 lb), SpO2 100.00%.Body mass index is 19.66 kg/(m^2).  General Appearance: Disheveled  Eye Solicitor::  Fair  Speech:  Clear and  Coherent  Volume:  Decreased  Mood:  Anxious depressed worried  Affect:  Restricted  Thought Process:  Coherent and Goal Directed  Orientation:  Full (Time, Place, and Person)  Thought Content:  symptoms worries and  concerns  Suicidal Thoughts:  No  Homicidal Thoughts:  No  Memory:  Immediate;   Fair Recent;   Fair Remote;   Fair  Judgement:  Fair  Insight:  Present and Shallow  Psychomotor Activity:  Restlessness  Concentration:  Fair  Recall:  [employer] of Knowledge:NA  Language: Fair  Akathisia:  No  Handed:    AIMS (if indicated):     Assets:  Desire for Improvement  Sleep:  Number of Hours: 6.75   Musculoskeletal: Strength & Muscle Tone: within normal limits Gait & Station: normal Patient leans: N/A  Current Medications: Current Facility-Administered Medications  Medication Dose Route Frequency Provider Last Rate Last Dose  . acetaminophen (TYLENOL) tablet 650 mg  650 mg Oral Q6H PRN Benjaman Pott, MD   650 mg at 11/14/13 2325  . alum & mag hydroxide-simeth (MAALOX/MYLANTA) 200-200-20 MG/5ML suspension 30 mL  30 mL Oral Q4H PRN Benjaman Pott, MD      . cloNIDine (CATAPRES) tablet 0.1 mg  0.1 mg Oral BH-qamhs Kerry Hough, PA-C       Followed by  . [START ON 11/19/2013] cloNIDine (CATAPRES) tablet 0.1 mg  0.1 mg Oral QAC breakfast Kerry Hough, PA-C   0.1 mg at 11/14/13 4098  . dicyclomine (BENTYL) tablet 20 mg  20 mg Oral Q6H PRN Kerry Hough, PA-C  20 mg at 11/14/13 2326  . docusate sodium (COLACE) capsule 100 mg  100 mg Oral BID Benjaman Pott, MD      . feeding supplement (ENSURE COMPLETE) (ENSURE COMPLETE) liquid 237 mL  237 mL Oral BID BM Tenny Craw, RD   237 mL at 11/15/13 1424  . hydrOXYzine (ATARAX/VISTARIL) tablet 25 mg  25 mg Oral Q6H PRN Benjaman Pott, MD   25 mg at 11/14/13 2326  . ibuprofen (ADVIL,MOTRIN) tablet 600 mg  600 mg Oral Q6H PRN Kerry Hough, PA-C   600 mg at 11/16/13 0825  . loperamide (IMODIUM) capsule 2-4 mg  2-4  mg Oral PRN Kerry Hough, PA-C      . LORazepam (ATIVAN) tablet 1 mg  1 mg Oral Q6H PRN Sanjuana Kava, NP   1 mg at 11/16/13 1102  . LORazepam (ATIVAN) tablet 1 mg  1 mg Oral BID Sanjuana Kava, NP       Followed by  . [START ON 11/18/2013] LORazepam (ATIVAN) tablet 1 mg  1 mg Oral Daily Sanjuana Kava, NP      . magnesium hydroxide (MILK OF MAGNESIA) suspension 30 mL  30 mL Oral Daily PRN Benjaman Pott, MD      . methocarbamol (ROBAXIN) tablet 500 mg  500 mg Oral Q8H PRN Kerry Hough, PA-C   500 mg at 11/16/13 0825  . mirtazapine (REMERON) tablet 30 mg  30 mg Oral QHS Benjaman Pott, MD   30 mg at 11/14/13 2325  . multivitamin with minerals tablet 1 tablet  1 tablet Oral Daily Sanjuana Kava, NP   1 tablet at 11/16/13 1610  . nicotine polacrilex (NICORETTE) gum 2 mg  2 mg Oral PRN Rachael Fee, MD   2 mg at 11/16/13 1111  . OLANZapine zydis (ZYPREXA) disintegrating tablet 10 mg  10 mg Oral QHS Rachael Fee, MD      . OLANZapine zydis [facility]) disintegrating tablet 5 mg  5 mg Oral BID Rachael Fee, MD   5 mg at 11/16/13 0803  . ondansetron (ZOFRAN-ODT) disintegrating tablet 4 mg  4 mg Oral Q6H PRN Kerry Hough, PA-C      . prazosin (MINIPRESS) capsule 1 mg  1 mg Oral QHS Benjaman Pott, MD      . sertraline (ZOLOFT) tablet 50 mg  50 mg Oral Daily Rachael Fee, MD   50 mg at 11/16/13 9604  . thiamine (B-1) injection 100 mg  100 mg Intramuscular Once Sanjuana Kava, NP      . thiamine (VITAMIN B-1) tablet 100 mg  100 mg Oral Daily Sanjuana Kava, NP   100 mg at 11/16/13 0803  . topiramate (TOPAMAX) tablet 25 mg  25 mg Oral BID Rachael Fee, MD   25 mg at 11/16/13 5409    Lab Results: No results found for this or any previous visit (from the past 48 hour(s)).  Physical Findings: AIMS: Facial and Oral Movements Muscles of Facial Expression: None, normal Lips and Perioral Area: None, normal Jaw: None, normal Tongue: None, normal,Extremity Movements Upper (arms, wrists, hands,  fingers): None, normal Lower (legs, knees, ankles, toes): None, normal, Trunk Movements Neck, shoulders, hips: None, normal, Overall Severity Severity of abnormal movements (highest score from questions above): None, normal Incapacitation due to abnormal movements: None, normal Patient's awareness of abnormal movements (rate only patient's report): No Awareness, Dental Status Current problems with teeth and/or dentures?: No  Does patient usually wear dentures?: No  CIWA:  CIWA-Ar Total: 7 COWS:  COWS Total Score: 4  Treatment Plan Summary: Daily contact with patient to assess and evaluate symptoms and progress in treatment Medication management  Plan: Supportive approach/coping skills/relapse prevention          Continue detox          Optimize treatment with Zyprexa          Will D/C 1:1 as he denies SI and can contract for safety          Explore placement options  Medical Decision Making Problem Points:  Review of psycho-social stressors (1) Data Points:  Review of medication regiment & side effects (2) Review of new medications or change in dosage (2)  I certify that inpatient services furnished can reasonably be expected to improve the patient's condition.   Richard Holz A 11/16/2013, 1:41 PM

## 2013-11-16 NOTE — [facility] Group Notes (Signed)
[facility] LCSW Aftercare Discharge Planning Group Note  11/16/2013  8:45 AM  Participation Quality: Did Not Attend.  Denise Bramblett, MSW, LCSWA Clinical Social Worker [facility] [phone]   

## 2013-11-16 NOTE — Progress Notes (Signed)
RN 1:1 Note  D: Pt in bed resting. Respirations even and unlabored. Pt appears to be in no signs of distress at this time. Pt did not wake up to the writer's calling to go to the dayroom for vitals.  A: 1:1 observation remains for this pt.  R: Pt remains safe at this time.

## 2013-11-16 NOTE — Progress Notes (Signed)
Patient ID: Mason Jones, male   DOB: 09/17/1985, 28 y.o.   MRN: [id number]  Pt alert and oriented. In room lying in bed. Minimum interaction with staff and peers. Denies SI/HI to Clinical research associatewriter; -A/[place]; verbally contracts for safety. C/o anxiety,withdrawal symptoms and mild H/A. Pt monitored q 15min, medication given as ordered, encouraged to attend groups and emotional support provided. Will continue to monitor and evaluate for stabilization.

## 2013-11-16 NOTE — Progress Notes (Signed)
[facility] Post 1:1 Observation Documentation  For the first (8) hours following discontinuation of 1:1 precautions, a progress note entry by nursing staff should be documented at least every 2 hours, reflecting the patient's behavior, condition, mood, and conversation.  Use the progress notes for additional entries.  Time 1:1 discontinued:  1103 hrs  Patient's Behavior:  Patient is eating his meal at this time  Patient's Condition:  Patient is sitting in his room alone and is very minimal and does not   Patient's Conversation:  Patient denies SI/HI and A/V hallucinations  Leighton Parodyyson, Yotam Rhine M 11/16/2013, 3:06 PM

## 2013-11-16 NOTE — Progress Notes (Signed)
[facility] Post 1:1 Observation Documentation  For the first (8) hours following discontinuation of 1:1 precautions, a progress note entry by nursing staff should be documented at least every 2 hours, reflecting the patient's behavior, condition, mood, and conversation.  Use the progress notes for additional entries.  Time 1:1 discontinued:  1103hrs  Patient's Behavior:  Patient walking down the hall way  Patient's Condition:  Patient is flat and blunted  Patient's Conversation:  Patient is asking for ativan for anxiety  Leighton Parodyyson, Mckaylah Bettendorf M 11/16/2013, 7:25 PM

## 2013-11-16 NOTE — Tx Team (Signed)
Interdisciplinary Treatment Plan Update (Adult) Date: 11/16/2013   Time Reviewed: 9:30 AM  Progress in Treatment: Attending groups: No Participating in groups: No Taking medication as prescribed: Yes Tolerating medication: Yes Family/Significant other contact made: CSW continuing to assess Patient understands diagnosis: Yes Discussing patient identified problems/goals with staff: Yes Medical problems stabilized or resolved: Yes Denies suicidal/homicidal ideation: unknown Issues/concerns per patient self-inventory: Yes Other:  New problem(s) identified: N/A  Discharge Plan or Barriers: CSW continuing to assess. Patient requesting residential substance abuse treatment at this time. [facility] and did not follow up with referrals to [facility] or [facility] program made at that time.   Reason for Continuation of Hospitalization:  Depression Anxiety Medication Stabilization   Comments: N/A  Estimated length of stay: 3-5 days  For review of initial/current patient goals, please see plan of care.  Attendees: Patient:    Family:    Physician: Dr. Jama Flavorsobos; Dr. Dub MikesLugo 11/16/2013 9:30 AM  Nursing: Harold Barbanonecia Byrd; Harrell LarkPatty D. , RN 11/16/2013 9:30 AM  Clinical Social Worker: Samuella BruinKristin Rumi Taras,  LCSWA 11/16/2013 9:30 AM  Other: Juline PatchQuylle Hodnett, LCSW 11/16/2013 9:30 AM  Other: Leisa LenzValerie Enoch, Vesta [facility] Liaison 11/16/2013 9:30 AM     Other:    Other:    Other:    Other:    Other:    Other:       Scribe for Treatment Team:  Samuella BruinKristin Dylann Gallier, MSW, [employer]CSWA [phone]

## 2013-11-16 NOTE — Progress Notes (Signed)
[facility] Post 1:1 Observation Documentation  For the first (8) hours following discontinuation of 1:1 precautions, a progress note entry by nursing staff should be documented at least every 2 hours, reflecting the patient's behavior, condition, mood, and conversation.  Use the progress notes for additional entries.  Time 1:1 discontinued:  1103 hrs  Patient's Behavior:  Patient is on the phone and walking up and down the hallway  Patient's Condition:  Patient is very minimal with the staff and he is flat and blunted  Patient's Conversation:  Patient asking for another Ativan less than 30 minutes after he received he scheduled dose of ativan and zyprexa and really complaining of a lot of anxiety  Leighton Parodyyson, Xoey Warmoth M 11/16/2013, 7:07 PM

## 2013-11-16 NOTE — Progress Notes (Signed)
[facility] Post 1:1 Observation Documentation  For the first (8) hours following discontinuation of 1:1 precautions, a progress note entry by nursing staff should be documented at least every 2 hours, reflecting the patient's behavior, condition, mood, and conversation.  Use the progress notes for additional entries.  Time 1:1 discontinued:  1103  Patient's Behavior:  Patient is calm and reporting anxiety  Patient's Condition:  Patient is flat and blunted and walking up and down the hallway  Patient's Conversation:  Patient reports that the vistaril does not help and only the ativan helps  Mason Jones, Mason Jones 11/16/2013, 11:14 AM

## 2013-11-16 NOTE — [facility] Group Notes (Addendum)
[facility] LCSW Group Therapy 11/16/2013  1:15 PM   Type of Therapy: Group Therapy  Participation Level: Did Not Attend.   Samuella BruinKristin Haeli Gerlich, MSW, [employer] Clinical Social Worker [facility] [phone]

## 2013-11-17 MED ORDER — LORAZEPAM 1 MG PO TABS
1.0000 mg | ORAL_TABLET | Freq: Four times a day (QID) | ORAL | Status: AC | PRN
Start: 2013-11-17 — End: 2013-11-17
  Filled 2013-11-17: qty 1

## 2013-11-17 MED ORDER — LORAZEPAM 1 MG PO TABS
1.0000 mg | ORAL_TABLET | Freq: Two times a day (BID) | ORAL | Status: AC
  Administered 2013-11-17: 1 mg via ORAL
  Filled 2013-11-17: qty 1

## 2013-11-17 NOTE — Progress Notes (Signed)
Chart reviewed. Case discussed with NP. Agree with above assessment and plan.  Myrl Lazarus, MD  

## 2013-11-17 NOTE — Progress Notes (Signed)
[facility] MD Progress Note  11/17/2013 11:54 AM Mason Jones  MRN:  [id number] Subjective: I think I need more than this, I know this is only 3-5 days I don't know if you guys can help with transportation. I honestly think I need services at [facility] for substance abuse and mental health. He is willing to seek intensive inpatient treatment. "I feel like I need [facility], I have heard good results about them and I know I have to put forth the effort and I am willing to go. I need [facility]." He goes on to say that if he can find a bed available at [facility] can he go there for treatment since we don't give Suboxone here. "These withdrawls are kicking my butt. If I can find a ride to [facility] or [facility] would you guys let me go?"   Objective: Mason Jones is still having a hard time. He continues to tolerate Zyprexa well so far. He states he wants to pursue further treatment as he states he will not be able to make it otherwise. If he cant get to a residential treatment center he states he can try to get into a half way house and continue to work on his Recovery. Pt was observed making a tea in the dayroom, once again by the writer to come by for an update on progress, pt is observed grabbing his stomach and bent over in agonizing pain. He is able to talk in spurts while bent over in pain.   Diagnosis:   DSM5: Trauma-Stressor Disorders:  Posttraumatic Stress Disorder (309.81) Substance/Addictive Disorders:  Opioid Disorder - Severe (304.00), Benzodiazepine Use Disorder Depressive Disorders:  Major Depressive Disorder - Severe (296.23) R/O psychotic features Total Time spent with patient: 30 minutes  Axis I: Substance Induced Mood Disorder  ADL's:  Intact  Sleep: Fair  Appetite:  Poor Psychiatric Specialty Exam: Physical Exam   Review of Systems  Constitutional: Positive for malaise/fatigue.  HENT: Negative.   Eyes: Negative.   Respiratory: Negative.   Cardiovascular: Negative.   Gastrointestinal: Negative.    Genitourinary: Negative.   Musculoskeletal: Negative.   Skin: Negative.   Neurological: Positive for weakness.  Endo/Heme/Allergies: Negative.   Psychiatric/Behavioral: Positive for depression and substance abuse. The patient is nervous/anxious and has insomnia.     Blood pressure 113/61, pulse 84, temperature 97.8 F (36.6 C), temperature source Oral, resp. rate 16, height 5\' 10"  (1.778 m), weight 62.143 kg (137 lb), SpO2 100.00%.Body mass index is 19.66 kg/(m^2).  General Appearance: Fairly Groomed  Patent attorney::  Minimal  Speech:  Pressured and Slow  Volume:  Decreased  Mood:  Anxious depressed worried  Affect:  Restricted  Thought Process:  Coherent and Goal Directed  Orientation:  Full (Time, Place, and Person)  Thought Content:  symptoms worries and  concerns  Suicidal Thoughts:  No  Homicidal Thoughts:  No  Memory:  Immediate;   Fair Recent;   Fair Remote;   Fair  Judgement:  Fair  Insight:  Present and Shallow  Psychomotor Activity:  Decreased and Restlessness  Concentration:  Fair  Recall:  [employer] of Knowledge:NA  Language: Fair  Akathisia:  No  Handed:    AIMS (if indicated):     Assets:  Desire for Improvement  Sleep:  Number of Hours: 6.75   Musculoskeletal: Strength & Muscle Tone: within normal limits Gait & Station: normal Patient leans: N/A  Current Medications: Current Facility-Administered Medications  Medication Dose Route Frequency Provider Last Rate Last Dose  . acetaminophen (  TYLENOL) tablet 650 mg  650 mg Oral Q6H PRN Benjaman Pott, MD   650 mg at 11/14/13 2325  . alum & mag hydroxide-simeth (MAALOX/MYLANTA) 200-200-20 MG/5ML suspension 30 mL  30 mL Oral Q4H PRN Benjaman Pott, MD      . cloNIDine (CATAPRES) tablet 0.1 mg  0.1 mg Oral BH-qamhs Mena Goes Simon, PA-C   0.1 mg at 11/17/13 0913   Followed by  . [START ON 11/19/2013] cloNIDine (CATAPRES) tablet 0.1 mg  0.1 mg Oral QAC breakfast Kerry Hough, PA-C   0.1 mg at 11/14/13 1610   . dicyclomine (BENTYL) tablet 20 mg  20 mg Oral Q6H PRN Kerry Hough, PA-C   20 mg at 11/14/13 2326  . docusate sodium (COLACE) capsule 100 mg  100 mg Oral BID Benjaman Pott, MD   100 mg at 11/17/13 0911  . feeding supplement (ENSURE COMPLETE) (ENSURE COMPLETE) liquid 237 mL  237 mL Oral BID BM Tenny Craw, RD   237 mL at 11/16/13 2206  . hydrOXYzine (ATARAX/VISTARIL) tablet 25 mg  25 mg Oral Q6H PRN Benjaman Pott, MD   25 mg at 11/14/13 2326  . ibuprofen (ADVIL,MOTRIN) tablet 600 mg  600 mg Oral Q6H PRN Kerry Hough, PA-C   600 mg at 11/16/13 0825  . loperamide (IMODIUM) capsule 2-4 mg  2-4 mg Oral PRN Kerry Hough, PA-C      . [START ON 11/18/2013] LORazepam (ATIVAN) tablet 1 mg  1 mg Oral Daily Sanjuana Kava, NP      . LORazepam (ATIVAN) tablet 1 mg  1 mg Oral BID Rachael Fee, MD      . LORazepam (ATIVAN) tablet 1 mg  1 mg Oral Q6H PRN Rachael Fee, MD      . magnesium hydroxide (MILK OF MAGNESIA) suspension 30 mL  30 mL Oral Daily PRN Benjaman Pott, MD      . methocarbamol (ROBAXIN) tablet 500 mg  500 mg Oral Q8H PRN Kerry Hough, PA-C   500 mg at 11/16/13 0825  . mirtazapine (REMERON) tablet 30 mg  30 mg Oral QHS Benjaman Pott, MD   30 mg at 11/16/13 2202  . multivitamin with minerals tablet 1 tablet  1 tablet Oral Daily Sanjuana Kava, NP   1 tablet at 11/17/13 9604  . nicotine polacrilex (NICORETTE) gum 2 mg  2 mg Oral PRN Rachael Fee, MD   2 mg at 11/17/13 1050  . OLANZapine zydis (ZYPREXA) disintegrating tablet 10 mg  10 mg Oral QHS Rachael Fee, MD   10 mg at 11/16/13 2202  . OLANZapine zydis (ZYPREXA) disintegrating tablet 5 mg  5 mg Oral BID Rachael Fee, MD   5 mg at 11/17/13 0912  . ondansetron (ZOFRAN-ODT) disintegrating tablet 4 mg  4 mg Oral Q6H PRN Kerry Hough, PA-C      . prazosin (MINIPRESS) capsule 1 mg  1 mg Oral QHS Benjaman Pott, MD   1 mg at 11/16/13 2203  . sertraline (ZOLOFT) tablet 50 mg  50 mg Oral Daily Rachael Fee, MD   50 mg  at 11/17/13 0912  . thiamine (B-1) injection 100 mg  100 mg Intramuscular Once Sanjuana Kava, NP      . thiamine (VITAMIN B-1) tablet 100 mg  100 mg Oral Daily Sanjuana Kava, NP   100 mg at 11/17/13 0912  . topiramate (TOPAMAX) tablet 25 mg  25 mg Oral BID Rachael FeeIrving A Lugo, MD   25 mg at 11/17/13 16100913    Lab Results: No results found for this or any previous visit (from the past 48 hour(s)).  Physical Findings: AIMS: Facial and Oral Movements Muscles of Facial Expression: None, normal Lips and Perioral Area: None, normal Jaw: None, normal Tongue: None, normal,Extremity Movements Upper (arms, wrists, hands, fingers): None, normal Lower (legs, knees, ankles, toes): None, normal, Trunk Movements Neck, shoulders, hips: None, normal, Overall Severity Severity of abnormal movements (highest score from questions above): None, normal Incapacitation due to abnormal movements: None, normal Patient's awareness of abnormal movements (rate only patient's report): No Awareness, Dental Status Current problems with teeth and/or dentures?: No Does patient usually wear dentures?: No  CIWA:  CIWA-Ar Total: 3 COWS:  COWS Total Score: 3  Treatment Plan Summary: Daily contact with patient to assess and evaluate symptoms and progress in treatment Medication management  Plan: Supportive approach/coping skills/relapse prevention          Continue detox          Optimize treatment with Zyprexa, continue prn medications for withdrawal symtpoms          Will D/C 1:1 as he denies SI and can contract for safety          Explore placement options  Medical Decision Making Problem Points:  Review of psycho-social stressors (1) Data Points:  Review of medication regiment & side effects (2) Review of new medications or change in dosage (2)  I certify that inpatient services furnished can reasonably be expected to improve the patient's condition.   Malachy ChamberSTARKES, Wheeler Incorvaia S FNP-BC 11/17/2013, 11:54 AM

## 2013-11-17 NOTE — [facility] Group Notes (Signed)
[facility] Group Notes: (Clinical Social Work)   11/17/2013      Type of Therapy:  Group Therapy   Participation Level:  Did Not Attend    Ambrose Mantle, LCSW 11/17/2013, 2:43 PM

## 2013-11-18 MED ORDER — KETOROLAC TROMETHAMINE 60 MG/2ML IM SOLN
60.0000 mg | Freq: Once | INTRAMUSCULAR | Status: AC
  Administered 2013-11-18: 60 mg via INTRAMUSCULAR
  Filled 2013-11-18: qty 2

## 2013-11-18 NOTE — Progress Notes (Signed)
D) Pt has been in his bed much of the shift, stating he is in pain and that he does not feel well. Was given Toradol 60 mg IM for his discomfort and "hurting all over". Pt comes to the medication window and will emphasize the fact that he is shaking when he asks for additional medications.  Is focused on medications and has not attended any groups. Upset over the fact he is not getting Ativan anymore. A) Encouraged Pt to go to groups today and to speak with the provided when he meet with her to share his concerns over not getting the medication he feels he needs. Provided with brief 1:1's when Pt comes to the medication window. R) Pt denies SI and HI. States he feels like no one is listening to him and responding to his needs.

## 2013-11-18 NOTE — Progress Notes (Signed)
[facility] MD Progress Note  11/18/2013 9:26 AM Mason Jones  MRN:  [id number] Subjective: Im still struggling and in pain. He reports not resting well last night about 2 hours last night but he sleeps during the day. He also notes a decrease in his withdrawal symtpoms, detox treatment is not progressing however. Upon discharge he is hoping to get into [facility] for suboxone therapy. Mason Jones has not contacted [facility] today, because he knows it is a day by day basis and he has to wait for Mason Jones. Currently rates his depression 6/10, Anxiety 10/10 and denies any SI/HI/AVH.    Objective: Mason Jones is still having a hard time. He continues to tolerate Zyprexa well so far. He states he wants to pursue further treatment as soon as possible. Pt was observed making interaction with the nurses and clinical staff. His behavior on the unit is good, although not much participation in group activities and therapy sessions. He notes compliance with his medications, denies any side effects at this time.  Diagnosis:   DSM5: Trauma-Stressor Disorders:  Posttraumatic Stress Disorder (309.81) Substance/Addictive Disorders:  Opioid Disorder - Severe (304.00), Benzodiazepine Use Disorder Depressive Disorders:  Major Depressive Disorder - Severe (296.23) R/O psychotic features Total Time spent with patient: 30 minutes  Axis I: Substance Induced Mood Disorder  ADL's:  Intact  Sleep: Fair  Appetite:  Poor Psychiatric Specialty Exam: Physical Exam  Constitutional: He is oriented to person, place, and time. He appears well-developed.  Musculoskeletal: Normal range of motion.  Neurological: He is alert and oriented to person, place, and time.  Skin: Skin is warm and dry.    Review of Systems  Constitutional: Positive for malaise/fatigue.  HENT: Negative.   Eyes: Negative.   Respiratory: Negative.   Cardiovascular: Negative.   Gastrointestinal: Negative.   Genitourinary: Negative.   Musculoskeletal: Positive for myalgias.  Skin:  Negative.   Neurological: Positive for weakness.  Endo/Heme/Allergies: Negative.   Psychiatric/Behavioral: Positive for depression and substance abuse. The patient is nervous/anxious and has insomnia.     Blood pressure 128/58, pulse 74, temperature 98.4 F (36.9 C), temperature source Oral, resp. rate 16, height  (1.778 m), weight 62.143 kg (137 lb), SpO2 100.00%.Body mass index is 19.66 kg/(m^2).  General Appearance: Fairly Groomed  Patent attorney::  Minimal  Speech:  Clear and Coherent and Normal Rate  Volume:  Decreased  Mood:  Anxious depressed worried  Affect:  Congruent and Flat  Thought Process:  Coherent and Goal Directed  Orientation:  Full (Time, Place, and Person)  Thought Content:  symptoms worries and  concerns  Suicidal Thoughts:  No  Homicidal Thoughts:  No  Memory:  Immediate;   Fair Recent;   Fair Remote;   Fair  Judgement:  Fair  Insight:  Present and Shallow  Psychomotor Activity:  Normal  Concentration:  Fair  Recall:  Mason Jones of Knowledge:NA  Language: Fair  Akathisia:  No  Handed:    AIMS (if indicated):     Assets:  Desire for Improvement  Sleep:  Number of Hours: 5.25   Musculoskeletal: Strength & Muscle Tone: within normal limits Gait & Station: normal Patient leans: N/A  Current Medications: Current Facility-Administered Medications  Medication Dose Route Frequency Provider Last Rate Last Dose  . acetaminophen (TYLENOL) tablet 650 mg  650 mg Oral Q6H PRN Benjaman Pott, MD   650 mg at 11/14/13 2325  . alum & mag hydroxide-simeth (MAALOX/MYLANTA) 200-200-20 MG/5ML suspension 30 mL  30 mL Oral Q4H PRN Earvin Hansen  Irish Lack, MD      . Melene Muller ON 11/19/2013] cloNIDine (CATAPRES) tablet 0.1 mg  0.1 mg Oral QAC breakfast Kerry Hough, PA-C   0.1 mg at 11/14/13 1610  . dicyclomine (BENTYL) tablet 20 mg  20 mg Oral Q6H PRN Kerry Hough, PA-C   20 mg at 11/14/13 2326  . docusate sodium (COLACE) capsule 100 mg  100 mg Oral BID Benjaman Pott, MD    100 mg at 11/18/13 9604  . feeding supplement (ENSURE COMPLETE) (ENSURE COMPLETE) liquid 237 mL  237 mL Oral BID BM Tenny Craw, RD   237 mL at 11/17/13 1526  . hydrOXYzine (ATARAX/VISTARIL) tablet 25 mg  25 mg Oral Q6H PRN Benjaman Pott, MD   25 mg at 11/14/13 2326  . ibuprofen (ADVIL,MOTRIN) tablet 600 mg  600 mg Oral Q6H PRN Kerry Hough, PA-C   600 mg at 11/16/13 0825  . loperamide (IMODIUM) capsule 2-4 mg  2-4 mg Oral PRN Kerry Hough, PA-C      . magnesium hydroxide (MILK OF MAGNESIA) suspension 30 mL  30 mL Oral Daily PRN Benjaman Pott, MD      . methocarbamol (ROBAXIN) tablet 500 mg  500 mg Oral Q8H PRN Kerry Hough, PA-C   500 mg at 11/16/13 0825  . mirtazapine (REMERON) tablet 30 mg  30 mg Oral QHS Benjaman Pott, MD   30 mg at 11/17/13 2109  . multivitamin with minerals tablet 1 tablet  1 tablet Oral Daily Sanjuana Kava, NP   1 tablet at 11/18/13 5409  . nicotine polacrilex (NICORETTE) gum 2 mg  2 mg Oral PRN Rachael Fee, MD   2 mg at 11/18/13 0920  . OLANZapine zydis (ZYPREXA) disintegrating tablet 10 mg  10 mg Oral QHS Rachael Fee, MD   10 mg at 11/17/13 2109  . OLANZapine zydis (ZYPREXA) disintegrating tablet 5 mg  5 mg Oral BID Rachael Fee, MD   5 mg at 11/18/13 8119  . ondansetron (ZOFRAN-ODT) disintegrating tablet 4 mg  4 mg Oral Q6H PRN Kerry Hough, PA-C      . prazosin (MINIPRESS) capsule 1 mg  1 mg Oral QHS Benjaman Pott, MD   1 mg at 11/17/13 2109  . sertraline (ZOLOFT) tablet 50 mg  50 mg Oral Daily Rachael Fee, MD   50 mg at 11/18/13 1478  . thiamine (B-1) injection 100 mg  100 mg Intramuscular Once Sanjuana Kava, NP      . thiamine (VITAMIN B-1) tablet 100 mg  100 mg Oral Daily Sanjuana Kava, NP   100 mg at 11/18/13 0917  . topiramate (TOPAMAX) tablet 25 mg  25 mg Oral BID Rachael Fee, MD   25 mg at 11/18/13 2956    Lab Results: No results found for this or any previous visit (from the past 48 hour(s)).  Physical Findings: AIMS:  Facial and Oral Movements Muscles of Facial Expression: None, normal Lips and Perioral Area: None, normal Jaw: None, normal Tongue: None, normal,Extremity Movements Upper (arms, wrists, hands, fingers): None, normal Lower (legs, knees, ankles, toes): None, normal, Trunk Movements Neck, shoulders, hips: None, normal, Overall Severity Severity of abnormal movements (highest score from questions above): None, normal Incapacitation due to abnormal movements: None, normal Patient's awareness of abnormal movements (rate only patient's report): No Awareness, Dental Status Current problems with teeth and/or dentures?: No Does patient usually wear dentures?: No  CIWA:  CIWA-Ar Total: 6 COWS:  COWS Total Score: 3  Treatment Plan Summary: Daily contact with patient to assess and evaluate symptoms and progress in treatment Medication management  Plan: Supportive approach/coping skills/relapse prevention          Continue detox          Optimize treatment with Zyprexa, continue prn medications for withdrawal symtpoms         Explore placement options  Toradol  IM in a single dose for myalgia and body aches.  Medical Decision Making Problem Points:  Review of psycho-social stressors (1) Data Points:  Review of medication regiment & side effects (2) Review of new medications or change in dosage (2)  I certify that inpatient services furnished can reasonably be expected to improve the patient's condition.   Malachy Chamber S FNP-BC 11/18/2013, 9:26 AM

## 2013-11-18 NOTE — Progress Notes (Signed)
Case discussed with NP. Agree with above assessment and plan.  Ancil Linsey, MD

## 2013-11-18 NOTE — Progress Notes (Signed)
Writer spoke with patient briefly at medication window and he reports his day as being terrible.Writer inquired as to why his day was terrible and he reports that his life sucks. Patient appears sad and withdrawn. He asked when he can get his night medications and he was informed that 2100 is the earliest. Patient requested his ensure after he at first refused it. He left the window and came back shortly and reported that he doesn't want any of his medications tonight b/c he wants to do this on his own. Support and encouragement given. Writer informed him that if he changed his mind his medications are available.He denies si/hi/a/v hallucinations. He returned to his room and did not attend group. Safety maintained on unit with 15 min checks.

## 2013-11-18 NOTE — Progress Notes (Signed)
D Psychoeducational Group Note  Date: 11/18/2013 Time: 1045 Group Topic/Focus:  Making Healthy Choices:   The focus of this group is to help patients identify negative/unhealthy choices they were using prior to admission and identify positive/healthier coping strategies to replace them upon discharge.  Participation Level:  Did Not Attend  Participation Quality:  Inattentive  Affect:  Flat  Cognitive:  Lacking  Insight:  Lacking  Engagement in Group:  Lacking  Additional Comments:    11/18/2013,3:36 PM Jerrod Damiano, Joie Bimler

## 2013-11-18 NOTE — Progress Notes (Signed)
Psychoeducational Group Note  Date:  11/18/2013 Time:  0142  Group Topic/Focus:  Wrap-Up Group:   The focus of this group is to help patients review their daily goal of treatment and discuss progress on daily workbooks.  Participation Level: Did Not Attend  Participation Quality:  Not Applicable  Affect:  Not Applicable  Cognitive:  Not Applicable  Insight:  Not Applicable  Engagement in Group: Not Applicable  Additional Comments:  The patient didn't attend group since he wanted to sleep and wanted to "be left alone".   Lakeitha Basques S 11/18/2013, 1:42 AM

## 2013-11-18 NOTE — Progress Notes (Signed)
Writer spoke with patient at change of shift as soon as Clinical research associate came out of report he was requesting his bedtime medicines. Writer informed him of the time being too soon and patient became upset. Writer encouraged him to attend wrap up group and he reported that he was not going and he was going back to lay down. Writer informed him that his medications would be ready at 2100. Patient returned to his room and voiced no there complaints. He denies si/hi/a/v hallucinations. Safety maintained on unit with 15 min checks.

## 2013-11-18 NOTE — [facility] Group Notes (Signed)
[facility] Group Notes: (Clinical Social Work)   11/18/2013      Type of Therapy:  Group Therapy   Participation Level:  Did Not Attend    Lorn Butcher Grossman-Orr, LCSW 11/18/2013, 3:13 PM     

## 2013-11-18 NOTE — [facility] Group Notes (Signed)
0900 nursing orientation group   The focus of this group is to educate the patient on the purpose and policies of crisis stabilization and provide a format to answer questions about their admission.  The group details unit policies and expectations of patients while admitted.  Pt was in group but he had minimal interaction past introducing name which he stated,"Mason Jones"

## 2013-11-19 DIAGNOSIS — F411 Generalized anxiety disorder: Secondary | ICD-10-CM

## 2013-11-19 DIAGNOSIS — F39 Unspecified mood [affective] disorder: Secondary | ICD-10-CM

## 2013-11-19 MED ORDER — LORAZEPAM 1 MG PO TABS
1.0000 mg | ORAL_TABLET | Freq: Three times a day (TID) | ORAL | Status: AC | PRN
  Administered 2013-11-19 – 2013-11-21 (×4): 1 mg via ORAL
  Filled 2013-11-19 (×4): qty 1

## 2013-11-19 MED ORDER — LORAZEPAM 2 MG/ML IJ SOLN
1.0000 mg | Freq: Once | INTRAMUSCULAR | Status: AC
  Administered 2013-11-19: 1 mg via INTRAMUSCULAR
  Filled 2013-11-19: qty 1

## 2013-11-19 NOTE — Clinical Social Work Note (Addendum)
With patient permission, referral faxed to [facility].   CSW left voicemail for [facility] care coordinator Hurshel Party [phone] regarding initiating referral. Awaiting return call.    Samuella Bruin, MSW, [employer] Clinical Social Worker [facility] [phone]

## 2013-11-19 NOTE — Progress Notes (Signed)
The patient did not attend the A.A. Meeting and elected to sleep in his bedroom.

## 2013-11-19 NOTE — [facility] Group Notes (Signed)
Adult Psychoeducational Group Note  Date:  11/19/2013 Time:  10:40 PM  Group Topic/Focus:  Wrap-Up Group:   The focus of this group is to help patients review their daily goal of treatment and discuss progress on daily workbooks.  Participation Level:  Did Not Attend  Participation Quality:  did not attend  Affect:  did not attend   Cognitive:  did not attend   Insight: None  Engagement in Group:  did not attend   Modes of Intervention:  did not attend   Additional Comments:did not attend   Margy Clarks 11/19/2013, 10:40 PM

## 2013-11-19 NOTE — [facility] Group Notes (Signed)
.    [facility] LCSW Aftercare Discharge Planning Group Note  11/19/2013  8:45 AM   Participation Quality: Alert, Appropriate and Oriented  Mood/Affect: Depressed and Flat; anxious  Depression Rating: 8  Anxiety Rating: 12  Thoughts of Suicide: Pt endorsing SI  Will you contract for safety? No  Current AVH: Pt denies  Plan for Discharge/Comments: Pt attended discharge planning group and actively participated in group. CSW provided pt with today's workbook. Patient reports that he does "not feel very good" this morning. Patient endorsing high levels of depression, anxiety, withdrawal symptoms, and SI. He reports that he does not have health insurance at this time but would like residential treatment. He reports interest in [facility] program. Anticipated D/C on Tuesday 8/25 or Wednesday 8/26.  Transportation Means: CSW still assessing.  Supports: No supports mentioned at this time  Samuella Bruin, MSW, [employer] Clinical Social Worker [facility] [phone]

## 2013-11-19 NOTE — Clinical Social Work Note (Signed)
Patient has telephone screening with Mason Jones from [facility] program at 10:45 am on Tuesday 11/20/13. Patient informed, agreeable to telephone interview. CSW provided patient with information needed to contact Mason Jones.  Samuella Bruin, MSW, [employer] Clinical Social Worker [facility] [phone]

## 2013-11-19 NOTE — [facility] Suicide Risk Assessment (Signed)
[facility] INPATIENT:  Family/Significant Other Suicide Prevention Education  Suicide Prevention Education:  Patient Refusal for Family/Significant Other Suicide Prevention Education: The patient Mason Jones has refused to provide written consent for family/significant other to be provided Family/Significant Other Suicide Prevention Education during admission and/or prior to discharge.  Physician notified.  Dante Cooter, [name] 11/19/2013, 11:58 AM

## 2013-11-19 NOTE — [facility] Group Notes (Signed)
[facility] LCSW Group Therapy 11/19/2013  1:15 PM   Type of Therapy: Group Therapy  Participation Level: Did Not Attend.   Samuella Bruin, MSW, [employer] Clinical Social Worker [facility] [phone]

## 2013-11-19 NOTE — Progress Notes (Signed)
Patient ID: Mason Jones, male   DOB: [birth year], 28 y.o.   MRN: [id number] He has only been up for a short time today to get medication and talk to MD. He c/o anxiety and was ordered and given ativan IM prn X 1. His Q -15 min  chect sheet indicated that she has bee asleep all last night and today. His affect is very flat, sad,speach is soft and slow and he  Has poor to no eye contact.

## 2013-11-19 NOTE — Progress Notes (Signed)
Patient ID: Mason Jones, male   DOB: 04/23/1985, 28 y.o.   MRN: [id number] He was woke up from a sound sleep so tat he could go eat and get his medication. He had received his prn IM ativan after lunch.  When asked if he was having any withdrawals he said no.

## 2013-11-19 NOTE — Progress Notes (Signed)
[facility] MD Progress Note  11/19/2013 3:17 PM Costas Sena  MRN:  [id number] Subjective:  Trell endorses that he had a hard time during the weekend. States his GF came and insinuated that he was not doing his part. He states that he is dong all he can trying to get the help he needs. He states he had to ask her to leave. Endorses increased anxiety "jumping out of my skin." admits the voices have decreased in intensity. He endorses suicidal thoughts: states he has thought about braking the mirror in the bathroom close to the GYM and cut himself but states he would not hurt himself because of the baby he is going to have. He is willing to contract for safety and go to staff if he was to feel he would act on the thoughts. He states he really needs to get into a residential treatment center, other wise not sure if he is going to make it Diagnosis:   DSM5: Trauma-Stressor Disorders:  Posttraumatic Stress Disorder (309.81) Substance/Addictive Disorders:  Opioid Disorder - Severe (304.00) Depressive Disorders:  Major Depressive Disorder - Severe (296.23) Total Time spent with patient: 30 minutes  Axis I: Anxiety Disorder NOS and Mood Disorder NOS  ADL's:  Intact  Sleep: Fair  Appetite:  Fair   Psychiatric Specialty Exam: Physical Exam  Review of Systems  Constitutional: Positive for malaise/fatigue.  HENT: Negative.   Eyes: Negative.   Respiratory: Negative.   Cardiovascular: Negative.   Gastrointestinal: Negative.   Genitourinary: Negative.   Musculoskeletal: Negative.   Skin: Negative.   Neurological: Positive for weakness.  Endo/Heme/Allergies: Negative.   Psychiatric/Behavioral: Positive for depression, suicidal ideas and substance abuse. The patient is nervous/anxious.     Blood pressure 131/71, pulse 61, temperature 97.8 F (36.6 C), temperature source Oral, resp. rate 18, height  (1.778 m), weight 62.143 kg (137 lb), SpO2 100.00%.Body mass index is 19.66 kg/(m^2).  General  Appearance: Fairly Groomed  Patent attorney::  Minimal  Speech:  Clear and Coherent, Slow and not spontaneous  Volume:  fluctuates  Mood:  Anxious, Depressed, Hopeless and Worthless  Affect:  anxious, worried, sad  Thought Process:  Coherent and Goal Directed  Orientation:  Full (Time, Place, and Person)  Thought Content:  events over the weekend, his fear of not being able to make it  Suicidal Thoughts:  Yes fleeting with plan/can contract for safety  Homicidal Thoughts:  No  Memory:  Immediate;   Fair Recent;   Fair Remote;   Fair  Judgement:  Fair  Insight:  Present and Shallow  Psychomotor Activity:  Restlessness  Concentration:  Fair  Recall:  [employer] of Knowledge:NA  Language: Fair  Akathisia:  No  Handed:    AIMS (if indicated):     Assets:  Desire for Improvement  Sleep:  Number of Hours: 6.75   Musculoskeletal: Strength & Muscle Tone: within normal limits Gait & Station: normal Patient leans: N/A  Current Medications: Current Facility-Administered Medications  Medication Dose Route Frequency Provider Last Rate Last Dose  . acetaminophen (TYLENOL) tablet 650 mg  650 mg Oral Q6H PRN Benjaman Pott, MD   650 mg at 11/14/13 2325  . alum & mag hydroxide-simeth (MAALOX/MYLANTA) 200-200-20 MG/5ML suspension 30 mL  30 mL Oral Q4H PRN Benjaman Pott, MD      . docusate sodium (COLACE) capsule 100 mg  100 mg Oral BID Benjaman Pott, MD   100 mg at 11/19/13 0746  . feeding supplement (ENSURE  COMPLETE) (ENSURE COMPLETE) liquid 237 mL  237 mL Oral BID BM Tenny Craw, RD   237 mL at 11/19/13 1239  . hydrOXYzine (ATARAX/VISTARIL) tablet 25 mg  25 mg Oral Q6H PRN Benjaman Pott, MD   25 mg at 11/18/13 1746  . ibuprofen (ADVIL,MOTRIN) tablet 600 mg  600 mg Oral Q6H PRN Kerry Hough, PA-C   600 mg at 11/16/13 0825  . LORazepam (ATIVAN) tablet 1 mg  1 mg Oral Q8H PRN Rachael Fee, MD      . magnesium hydroxide (MILK OF MAGNESIA) suspension 30 mL  30 mL Oral Daily PRN  Benjaman Pott, MD      . mirtazapine (REMERON) tablet 30 mg  30 mg Oral QHS Benjaman Pott, MD   30 mg at 11/17/13 2109  . multivitamin with minerals tablet 1 tablet  1 tablet Oral Daily Sanjuana Kava, NP   1 tablet at 11/19/13 0746  . nicotine polacrilex (NICORETTE) gum 2 mg  2 mg Oral PRN Rachael Fee, MD   2 mg at 11/19/13 1044  . OLANZapine zydis (ZYPREXA) disintegrating tablet 10 mg  10 mg Oral QHS Rachael Fee, MD   10 mg at 11/17/13 2109  . OLANZapine zydis (ZYPREXA) disintegrating tablet 5 mg  5 mg Oral BID Rachael Fee, MD   5 mg at 11/19/13 0746  . prazosin (MINIPRESS) capsule 1 mg  1 mg Oral QHS Benjaman Pott, MD   1 mg at 11/17/13 2109  . sertraline (ZOLOFT) tablet 50 mg  50 mg Oral Daily Rachael Fee, MD   50 mg at 11/19/13 0745  . thiamine (B-1) injection 100 mg  100 mg Intramuscular Once Sanjuana Kava, NP      . thiamine (VITAMIN B-1) tablet 100 mg  100 mg Oral Daily Sanjuana Kava, NP   100 mg at 11/19/13 0747  . topiramate (TOPAMAX) tablet 25 mg  25 mg Oral BID Rachael Fee, MD   25 mg at 11/19/13 0745    Lab Results: No results found for this or any previous visit (from the past 48 hour(s)).  Physical Findings: AIMS: Facial and Oral Movements Muscles of Facial Expression: None, normal Lips and Perioral Area: None, normal Jaw: None, normal Tongue: None, normal,Extremity Movements Upper (arms, wrists, hands, fingers): None, normal Lower (legs, knees, ankles, toes): None, normal, Trunk Movements Neck, shoulders, hips: None, normal, Overall Severity Severity of abnormal movements (highest score from questions above): None, normal Incapacitation due to abnormal movements: None, normal Patient's awareness of abnormal movements (rate only patient's report): No Awareness, Dental Status Current problems with teeth and/or dentures?: No Does patient usually wear dentures?: No  CIWA:  CIWA-Ar Total: 6 COWS:  COWS Total Score: 7  Treatment Plan Summary: Daily contact  with patient to assess and evaluate symptoms and progress in treatment Medication management  Plan: Supportive approach/coping skills/relapse prevention           Will Extend the use of Ativan for the next 48 Hrs or so            Will reassess the use of Zyprexa              Medical Decision Making Problem Points:  Established problem, worsening (2) and Review of last therapy session (1) Data Points:  Review of medication regiment & side effects (2) Review of new medications or change in dosage (2)  I certify that inpatient services furnished can  reasonably be expected to improve the patient's condition.   Caraline Deutschman A 11/19/2013, 3:17 PM

## 2013-11-20 MED ORDER — ATOMOXETINE HCL 40 MG PO CAPS: 40.0000 mg | ORAL_CAPSULE | Freq: Every day | ORAL | Status: DC

## 2013-11-20 MED ORDER — ATOMOXETINE HCL 60 MG PO CAPS
60.0000 mg | ORAL_CAPSULE | Freq: Once | ORAL | Status: AC
  Administered 2013-11-20: 60 mg via ORAL
  Filled 2013-11-20: qty 1

## 2013-11-20 MED ORDER — ATOMOXETINE HCL 60 MG PO CAPS
60.0000 mg | ORAL_CAPSULE | Freq: Every day | ORAL | Status: DC
  Administered 2013-11-21: 60 mg via ORAL
  Filled 2013-11-20 (×2): qty 1

## 2013-11-20 NOTE — Progress Notes (Signed)
Patient ID: Mason Jones, male   DOB: November 01, 1985, 28 y.o.   MRN: [id number] He has been in bed except to see Dr.  Get medication and to eat lunch. Straterra ordered and given today. [name] is in bed sleeping easily awaken. He refused to fill out his self inventory.

## 2013-11-20 NOTE — Progress Notes (Signed)
D   Pt continues to be medication seeking and does not participate in activities or socialize with others   He appears anxious and sad and does not want to talk with staff except to ask for medications  A    Verbal support and encouragement offered   Medications administered and effectiveness monitored   Q 15 min checks R   Pt safe at present

## 2013-11-20 NOTE — [facility] Group Notes (Signed)
[facility] LCSW Group Therapy 11/20/2013  1:15 PM   Type of Therapy: Group Therapy  Participation Level: Did Not Attend.   Samuella Bruin, MSW, [employer] Clinical Social Worker [facility] [phone]

## 2013-11-20 NOTE — Progress Notes (Signed)
[facility] MD Progress Note  11/20/2013 12:18 PM Mason Jones  MRN:  [id number] Subjective:  Mason Jones will have an interview for admission to [facility]. He is still admitting to anxiety, worry. He states he gets really anxious. He would want to maintain the Ativan some more time. He has been sleeping better. He does not endorse hearing the voice inside his head. He is trying to get himself together so he can be a part of his girlfriend and child's life.  Diagnosis:   DSM5: Trauma-Stressor Disorders:  Posttraumatic Stress Disorder (309.81) Substance/Addictive Disorders:  Opioid Disorder - Severe (304.00) Depressive Disorders:  Major Depressive Disorder - Moderate (296.22) Total Time spent with patient: 30 minutes  Axis I: Generalized Anxiety Disorder  ADL's:  Intact  Sleep: Fair  Appetite:  Fair  Psychiatric Specialty Exam: Physical Exam  Review of Systems  Constitutional: Negative.   HENT: Negative.   Eyes: Negative.   Respiratory: Negative.   Cardiovascular: Negative.   Gastrointestinal: Negative.   Genitourinary: Negative.   Musculoskeletal: Negative.   Skin: Negative.   Neurological: Negative.   Endo/Heme/Allergies: Negative.   Psychiatric/Behavioral: Positive for substance abuse. The patient is nervous/anxious.     Blood pressure 124/66, pulse 86, temperature 98.4 F (36.9 C), temperature source Oral, resp. rate 20, height  (1.778 m), weight 62.143 kg (137 lb), SpO2 98.00%.Body mass index is 19.66 kg/(m^2).  General Appearance: Fairly Groomed  Patent attorney::  Fair  Speech:  Clear and Coherent  Volume:  Decreased  Mood:  Anxious and worried  Affect:  anxious, worried  Thought Process:  Coherent and Goal Directed  Orientation:  Full (Time, Place, and Person)  Thought Content:  symptoms worries concerns (somatically focused)  Suicidal Thoughts:  No  Homicidal Thoughts:  No  Memory:  Immediate;   Fair Recent;   Fair Remote;   Fair  Judgement:  Fair  Insight:  Present   Psychomotor Activity:  Restlessness  Concentration:  Fair  Recall:  [employer] of Knowledge:NA  Language: Fair  Akathisia:  No  Handed:    AIMS (if indicated):     Assets:  Desire for Improvement  Sleep:  Number of Hours: 5.5   Musculoskeletal: Strength & Muscle Tone: within normal limits Gait & Station: normal Patient leans: N/A  Current Medications: Current Facility-Administered Medications  Medication Dose Route Frequency Provider Last Rate Last Dose  . acetaminophen (TYLENOL) tablet 650 mg  650 mg Oral Q6H PRN Benjaman Pott, MD   650 mg at 11/14/13 2325  . alum & mag hydroxide-simeth (MAALOX/MYLANTA) 200-200-20 MG/5ML suspension 30 mL  30 mL Oral Q4H PRN Benjaman Pott, MD      . Melene Muller ON 11/21/2013] atomoxetine (STRATTERA) capsule 60 mg  60 mg Oral Daily Rachael Fee, MD      . docusate sodium (COLACE) capsule 100 mg  100 mg Oral BID Benjaman Pott, MD   100 mg at 11/19/13 0746  . feeding supplement (ENSURE COMPLETE) (ENSURE COMPLETE) liquid 237 mL  237 mL Oral BID BM Tenny Craw, RD   237 mL at 11/19/13 2012  . hydrOXYzine (ATARAX/VISTARIL) tablet 25 mg  25 mg Oral Q6H PRN Benjaman Pott, MD   25 mg at 11/20/13 4098  . ibuprofen (ADVIL,MOTRIN) tablet 600 mg  600 mg Oral Q6H PRN Kerry Hough, PA-C   600 mg at 11/16/13 0825  . LORazepam (ATIVAN) tablet 1 mg  1 mg Oral Q8H PRN Rachael Fee, MD   1  mg at 11/20/13 0839  . magnesium hydroxide (MILK OF MAGNESIA) suspension 30 mL  30 mL Oral Daily PRN Benjaman Pott, MD      . mirtazapine (REMERON) tablet 30 mg  30 mg Oral QHS Benjaman Pott, MD   30 mg at 11/19/13 2012  . multivitamin with minerals tablet 1 tablet  1 tablet Oral Daily Sanjuana Kava, NP   1 tablet at 11/20/13 1914  . nicotine polacrilex (NICORETTE) gum 2 mg  2 mg Oral PRN Rachael Fee, MD   2 mg at 11/20/13 1057  . OLANZapine zydis (ZYPREXA) disintegrating tablet 10 mg  10 mg Oral QHS Rachael Fee, MD   10 mg at 11/19/13 2013  . OLANZapine zydis  (ZYPREXA) disintegrating tablet 5 mg  5 mg Oral BID Rachael Fee, MD   5 mg at 11/20/13 7829  . prazosin (MINIPRESS) capsule 1 mg  1 mg Oral QHS Benjaman Pott, MD   1 mg at 11/19/13 2012  . sertraline (ZOLOFT) tablet 50 mg  50 mg Oral Daily Rachael Fee, MD   50 mg at 11/20/13 5621  . thiamine (B-1) injection 100 mg  100 mg Intramuscular Once Sanjuana Kava, NP      . thiamine (VITAMIN B-1) tablet 100 mg  100 mg Oral Daily Sanjuana Kava, NP   100 mg at 11/20/13 0837  . topiramate (TOPAMAX) tablet 25 mg  25 mg Oral BID Rachael Fee, MD   25 mg at 11/20/13 3086    Lab Results: No results found for this or any previous visit (from the past 48 hour(s)).  Physical Findings: AIMS: Facial and Oral Movements Muscles of Facial Expression: None, normal Lips and Perioral Area: None, normal Jaw: None, normal Tongue: None, normal,Extremity Movements Upper (arms, wrists, hands, fingers): None, normal Lower (legs, knees, ankles, toes): None, normal, Trunk Movements Neck, shoulders, hips: None, normal, Overall Severity Severity of abnormal movements (highest score from questions above): None, normal Incapacitation due to abnormal movements: None, normal Patient's awareness of abnormal movements (rate only patient's report): No Awareness, Dental Status Current problems with teeth and/or dentures?: No Does patient usually wear dentures?: No  CIWA:  CIWA-Ar Total: 5 COWS:  COWS Total Score: 0  Treatment Plan Summary: Daily contact with patient to assess and evaluate symptoms and progress in treatment Medication management  Plan: Supportive approach/coping skills/relapse prevention           Will encourage to find alternate ways of dealing with the anxiety (CBT;mindfulness) rather than continuing to use the Ativan past the           detox phase. He had an initial interview with [facility] over the phone. He was asked about the ADHD management. He was on                          Strattera before and  although not as effective as the stimulants he has used (not prescribed) he states is did help some.           The Zyprexa seems to be helping with the voice inside his head           Will start Strattera Medical Decision Making Problem Points:  Review of psycho-social stressors (1) Data Points:  Review of medication regiment & side effects (2) Review of new medications or change in dosage (2)  I certify that inpatient services furnished can reasonably be expected  to improve the patient's condition.   Zadin Lange A 11/20/2013, 12:18 PM

## 2013-11-20 NOTE — Clinical Social Work Note (Signed)
Patient not eligible for [facility] program due to not having [facility] I.D./residency.  Per [facility] intake RN Melissa, patient was recently discharged from [facility] on August 5th and is not eligible for services again at this time.  CSW received message from Hurshel Party from [place] program who states that patient's referral being reviewed by an Insight case Production designer, theatre/television/film. CSW attempted to contact Hurshel Party back, left message. Awaiting return call.   Samuella Bruin, MSW, [employer] Clinical Social Worker [facility] [phone]

## 2013-11-20 NOTE — Progress Notes (Signed)
Recreation Therapy Notes  Animal-Assisted Activity/Therapy (AAA/T) Program Checklist/Progress Notes Patient Eligibility Criteria Checklist & Daily Group note for Rec Tx Intervention  Date: 08.25.2015 Time: 2:45pm Location: [address] Dayroom   AAA/T Program Assumption of Risk Form signed by Patient/ or Parent Legal Guardian yes  Patient is free of allergies or sever asthma yes  Patient reports no fear of animals yes  Patient reports no history of cruelty to animals yes   Patient understands his/her participation is voluntary yes  Behavioral Response: Did not attend.   Om Lizotte L Jerrold Haskell, LRT/CTRS  Laporcha Marchesi L 11/20/2013 4:42 PM 

## 2013-11-20 NOTE — Clinical Social Work Note (Signed)
CSW left message for [facility] intake RN regarding referral made 11/19/13.  Samuella Bruin, MSW, [employer] Clinical Social Worker [facility] [phone]

## 2013-11-20 NOTE — Tx Team (Addendum)
Interdisciplinary Treatment Plan Update (Adult) Date: 11/20/2013   Time Reviewed: 9:30 AM  Progress in Treatment: Attending groups: No Participating in groups: No Taking medication as prescribed: Yes Tolerating medication: Yes Family/Significant other contact made: Patient declines for CSW to make collateral contact. Patient understands diagnosis: Yes Discussing patient identified problems/goals with staff: Yes Medical problems stabilized or resolved: Yes Denies suicidal/homicidal ideation: Endorsing SI Issues/concerns per patient self-inventory: Yes Other:  New problem(s) identified: N/A  Discharge Plan or Barriers: CSW continuing to assess. Patient requesting residential substance abuse treatment at this time. He was recently discharged from [facility] on 10/15/13 and did not follow up with referrals to [facility] or [facility] program made at that time. Patient declined at [facility] due to previous behavioral issues, not able to discharge to [facility] due to lack of [place] I.D. [facility] telephone screening this morning at 10:45 am.   Reason for Continuation of Hospitalization:  Depression Anxiety Medication Stabilization   Comments: N/A  Estimated length of stay: 2-4 days  For review of initial/current patient goals, please see plan of care.  Attendees:  Patient:   11/20/2013 8:51 AM   Signature: Geoffery Lyons, MD  11/20/2013 8:51 AM   Signature: Serena Colonel  11/20/2013 8:51 AM   Signature: Roswell Miners, RN  11/20/2013 8:51 AM   Signature: Quintella Reichert, RN  11/20/2013 8:51 AM   Signature: Neill Loft RN  11/20/2013 8:51 AM   Signature: Juline Patch, LCSW  11/20/2013 8:51 AM   Signature: Samuella Bruin, LCSW-A  11/20/2013 8:51 AM   Signature: Leisa Lenz, Care Coordinator [facility]  11/20/2013 8:51 AM   Signature:  11/20/2013 8:51 AM   Signature:  11/20/2013 8:51 AM   Signature: Onnie Boer, RN [facility]  11/20/2013 8:51 AM   Signature:  11/20/2013 8:51 AM        Scribe for Treatment  Team:  Samuella Bruin, MSW, LCSWA [phone]

## 2013-11-20 NOTE — Progress Notes (Signed)
The focus of this group is to educate the patient on the purpose and policies of crisis stabilization and provide a format to answer questions about their admission.  The group details unit policies and expectations of patients while admitted.  Patient attended 0900 nurse education orientation group this morning.  Patient actively participated, appropriate affect, alert, appropriate insight and engagement.  Today patient will work on 3 goals for discharge.  

## 2013-11-20 NOTE — Clinical Social Work Note (Signed)
CSW left voicemail for [facility] care coordinator Hurshel Party regarding telephone screening with patient and upcoming bed availability.   Samuella Bruin, MSW, [employer] Clinical Social Worker [facility] [phone]

## 2013-11-20 NOTE — Progress Notes (Signed)
Adult Psychoeducational Group Note  Date:  11/20/2013 Time:  10:16 PM  Group Topic/Focus:  Wrap-Up Group:   The focus of this group is to help patients review their daily goal of treatment and discuss progress on daily workbooks.  Participation Level:  Did Not Attend  Participation Quality:  NONE  Affect:  Flat  Cognitive:  Confused  Insight: Limited and None  Engagement in Group:  NONE  Modes of Intervention:  NONE  Additional Comments:  Pt decide not to come to group..the patient decide to stay in his room and sleep while group was going on.the patient want to stay by himself   Amalie Koran R 11/20/2013, 10:16 PM

## 2013-11-20 NOTE — Clinical Social Work Note (Addendum)
With patient permission, referral faxed to [facility] for review.   Samuella Bruin, MSW, [employer] Clinical Social Worker [facility] [phone]

## 2013-11-21 DIAGNOSIS — F1120 Opioid dependence, uncomplicated: Principal | ICD-10-CM

## 2013-11-21 DIAGNOSIS — F4310 Post-traumatic stress disorder, unspecified: Secondary | ICD-10-CM

## 2013-11-21 MED ORDER — ATOMOXETINE HCL 10 MG PO CAPS
20.0000 mg | ORAL_CAPSULE | Freq: Once | ORAL | Status: AC
  Administered 2013-11-21: 20 mg via ORAL
  Filled 2013-11-21: qty 2

## 2013-11-21 MED ORDER — PRAZOSIN HCL 1 MG PO CAPS: 1.0000 mg | ORAL_CAPSULE | Freq: Every day | ORAL | Status: DC

## 2013-11-21 MED ORDER — OLANZAPINE 5 MG PO TBDP: ORAL_TABLET | ORAL | Status: DC

## 2013-11-21 MED ORDER — TOPIRAMATE 25 MG PO TABS
25.0000 mg | ORAL_TABLET | Freq: Two times a day (BID) | ORAL | Status: DC
Start: 2013-11-21 — End: 2015-01-12

## 2013-11-21 MED ORDER — MIRTAZAPINE 30 MG PO TABS: 30.0000 mg | ORAL_TABLET | Freq: Every day | ORAL | Status: DC

## 2013-11-21 MED ORDER — ATOMOXETINE HCL 80 MG PO CAPS: 80.0000 mg | ORAL_CAPSULE | Freq: Every day | ORAL | Status: DC

## 2013-11-21 MED ORDER — DSS 100 MG PO CAPS: 100.0000 mg | ORAL_CAPSULE | Freq: Two times a day (BID) | ORAL | Status: DC

## 2013-11-21 MED ORDER — HYDROXYZINE HCL 25 MG PO TABS
25.0000 mg | ORAL_TABLET | Freq: Four times a day (QID) | ORAL | Status: DC | PRN
Start: 2013-11-21 — End: 2015-01-12

## 2013-11-21 MED ORDER — ATOMOXETINE HCL 40 MG PO CAPS
80.0000 mg | ORAL_CAPSULE | Freq: Every day | ORAL | Status: DC
  Filled 2013-11-21: qty 28
  Filled 2013-11-21: qty 2

## 2013-11-21 MED ORDER — SERTRALINE HCL 50 MG PO TABS: 50.0000 mg | ORAL_TABLET | Freq: Every day | ORAL | Status: DC

## 2013-11-21 NOTE — Progress Notes (Signed)
Patient ID: Mason Jones, male   DOB: [birth year], 28 y.o.   MRN: [id number] He has been discharged and was picked up by his girlfriend. He voiced understanding of discharge instruction and of follow up plan. He denies thoughts of SI and HI. All belongs taken home with him. He has been calm and cooperartive today has been up and out of bed more today.

## 2013-11-21 NOTE — Progress Notes (Signed)
D   Pt affect was more appropriate this evening   He was more engaged and asked questions and answered questions giving full eye contact    He first said he wasn't going to take any medication then he changed his mind and said he would take it   But also said the medication was making his legs and arms feel restless A   Discussed medications with pt and told him the zyprexa was probably causing the restlessness   He was encouraged to inform his doctor of his symptoms and was given the opportunity to refuse the medication until he talks to the doctor   Other medications were administered and effectiveness monitored   Q 15 min checks R   Pt verbalized understanding and did not take zyprexa this evening   He is safe at present

## 2013-11-21 NOTE — Progress Notes (Signed)
Adult Psychoeducational Group Note  Date:  11/21/2013 Time:  11:09 AM  Group Topic/Focus:  Personal Choices and Values:   The focus of this group is to help patients assess and explore the importance of values in their lives, how their values affect their decisions, how they express their values and what opposes their expression.  Participation Level:  Active  Participation Quality:  Appropriate, Attentive and Sharing  Affect:  Appropriate  Cognitive:  Alert, Appropriate and Oriented  Insight: Appropriate and Good  Engagement in Group:  Engaged  Modes of Intervention:  Discussion, Education, Socialization and Support  Additional Comments:  Pt attended and participated in group.  Pt stated that he needs to "overcome the desires of the flesh and this world and stop using drugs as a temporary fix instead of working on the real problems."  Pt states that he has more motivation to get better now than he has in the past and he wants to "remove my addiction."  Berlin Hun 11/21/2013, 11:09 AM

## 2013-11-21 NOTE — Progress Notes (Signed)
[facility] Adult Case Management Discharge Plan :  Will you be returning to the same living situation after discharge: Yes,  patient reports that he can stay with his fiance at discharge At discharge, do you have transportation home?:Yes,  patient reports that his fiance will provide transportation Do you have the ability to pay for your medications:Yes,  patient will be provided with necessary medication samples and prescriptions at discharge.   Release of information consent forms completed and in the chart;  Patient's signature needed at discharge.  Patient to Follow up at: Follow-up Information   Follow up with [facility] On 11/26/2013. (Please present to [facility] at 8 am on this date for intake appointment. Please bring your [facility] I.D., medications, and clothing. Please call if you need to reschedule appointment. )    Contact information:   Address: [address] [place], [place] [address] Phone: [phone] Fax: [phone]      Patient denies SI/HI:   Yes,  denies    Safety Planning and Suicide Prevention discussed:  Yes,  with patient  Mason Jones, Mason Jones 11/21/2013, 12:23 PM
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
## 2013-11-21 NOTE — Discharge Summary (Addendum)
Physician Discharge Summary Note  Patient:  Mason Jones is an 28 y.o., male MRN:  [id number] DOB:  [birth year] Patient phone:  [phone] (home)  Patient address:   [address] [place] [address],  Total Time spent with patient: Greater than 30 minutes  Date of Admission:  11/13/2013 Date of Discharge: 11/21/13  Reason for Admission: Opioid detoxification treatments  Discharge Diagnoses: Active Problems:   Severe recurrent major depression without psychotic features   Opiate dependence   Benzodiazepine abuse   Psychiatric Specialty Exam: Physical Exam  Psychiatric: His speech is normal and behavior is normal. Judgment and thought content normal. His mood appears not anxious. His affect is not angry, not blunt, not labile and not inappropriate. Cognition and memory are normal. He does not exhibit a depressed mood.    Review of Systems  Constitutional: Negative.   HENT: Negative.   Eyes: Negative.   Cardiovascular: Negative.   Gastrointestinal: Negative.   Genitourinary: Negative.   Musculoskeletal: Negative.   Skin: Negative.   Neurological: Negative.   Endo/Heme/Allergies: Negative.   Psychiatric/Behavioral: Positive for depression (Stable), hallucinations (hx of) and substance abuse (Polysubstance dependence). Negative for suicidal ideas and memory loss. The patient has insomnia (Stable). The patient is not nervous/anxious.     Blood pressure 139/65, pulse 100, temperature 98 F (36.7 C), temperature source Oral, resp. rate 20, height  (1.778 m), weight 62.143 kg (137 lb), SpO2 100.00%.Body mass index is 19.66 kg/(m^2).   General Appearance: Fairly Groomed   Patent attorney:: Fair   Speech: Clear and Coherent   Volume: Normal   Mood: Anxious   Affect: Appropriate   Thought Process: Coherent and Goal Directed   Orientation: Full (Time, Place, and Person)   Thought Content: plans as he moves on, relapse prevention plan   Suicidal Thoughts: No   Homicidal  Thoughts: No   Memory: Immediate; Fair  Recent; Fair  Remote; Fair   Judgement: Fair   Insight: Present   Psychomotor Activity: Normal   Concentration: Fair   Recall: [employer] of Knowledge:NA   Language: Fair   Akathisia: No   Handed:   AIMS (if indicated):   Assets: Desire for Improvement   Sleep: Number of Hours: 5.25    Past Psychiatric History: Diagnosis: Opioid dependence, Benzodiazepine dependence, Severe recurrent major depression without psychotic features, PTSD  Hospitalizations: [facility] adult unit x multiple times  Outpatient Care:  Substance Abuse Care: [facility]  Self-Mutilation: NA  Suicidal Attempts: NA  Violent Behaviors: NA   Musculoskeletal: Strength & Muscle Tone: within normal limits Gait & Station: normal Patient leans: N/A  DSM5: Schizophrenia Disorders:  NA Obsessive-Compulsive Disorders:  NA Trauma-Stressor Disorders:  Posttraumatic Stress Disorder (309.81) Substance/Addictive Disorders:  Opioid Disorder - Severe (304.00), Benzodiazepine dependence Depressive Disorders:  Severe recurrent major depression without psychotic features  Axis Diagnosis:  AXIS I:  Opioid dependence, Benzodiazepine dependence, Severe recurrent major depression without psychotic features, PTSD AXIS II:  Deferred AXIS III:   Past Medical History  Diagnosis Date  . Drug abuse   . Anxiety   . Depression    AXIS IV:  economic problems, housing problems, occupational problems, other psychosocial or environmental problems and Polysubstance dependence AXIS V:  62  Level of Care:  OP  Hospital Course: On this present admission assessment, Mason Jones reports, "I have a kid on the way, just learned about it. I have got to get myself together. I'm kind of hearing stuff, I mean voices for quite sometimes.  But, I did not want to tell anyone for fear of being labelled crazy. When I left this hospital on 10/15/13 after discharge, I was suppose to go to the [facility]  treatment center for treatment. While on the way to [facility] treatment center, I was detoured by wrong people. I stayed sober x 1 week. Relapsed because of the voices in my head and hanging with the wrong people.  Mason Jones was admitted to the hospital this time around with his UDS test reports showing positive barbiturates, benzodiazepine and THC. He also reported having been abusing opiates as well. He was presenting with hash withdrawals symptoms. He did report that he has been hearing voices as he well. Mason Jones was in need of drug detoxification as well as mood stabilization treatments. He received clonidine/ativan detoxification treatment protocols. He was also enrolled & participated in the group counseling sessions and AA/NA meetings being offered and held on this unit. Mason Jones was taught and learned coping skills to help him cope better and maintain sobriety after discharge.   Besides the detoxification treatments, Mason Jones was medicated and discharged on Strattera 80 mg daily for ADHD symptoms, Mirtazapine 30 mg Q bedtime for depression/insomnia, Minipress 1 mg Q bedtime for PTSD related nightmares, Zyprexa Zydis 5 mg bid and 10 mg Q bedtime for mood control/agitation, Hydroxyzine 25 mg three times daily as needed for anxiety, Sertraline 50 mg daily for depression and Topamax 25 mg twice daily for mood stabilization. He also received medication management for the other medical issues that he presented. Mason Jones tolerated his treatment regimen without any adverse effects reported.   Mason Jones has completed detox treatments and his mood is stable. He is currently being discharged to continue routine psychiatric treatment and medication management at the [facility] clinic here in [place], [place]. And for substance abuse treatment, he has a referral and an appointment with the [facility] treatment center on 11/26/13. He is provided with all the pertinent information required to make these appointments without problems.  Upon discharge, he adamantly denies any SIHI, AVH, delusions, paranoia and or withdrawal symptoms. He received from [facility] a 14 days worth, supply samples of his [facility] discharge medications. He left [facility] with all belongings in distress. Transportation per patient's arrangement.   Consults:  psychiatry  Significant Diagnostic Studies:  labs: CBC with diff, CMP, UDS, toxicology tests, U/A  Discharge Vitals:   Blood pressure 139/65, pulse 100, temperature 98 F (36.7 C), temperature source Oral, resp. rate 20, height  (1.778 m), weight 62.143 kg (137 lb), SpO2 100.00%. Body mass index is 19.66 kg/(m^2). Lab Results:   No results found for this or any previous visit (from the past 72 hour(s)).  Physical Findings: AIMS: Facial and Oral Movements Muscles of Facial Expression: None, normal Lips and Perioral Area: None, normal Jaw: None, normal Tongue: None, normal,Extremity Movements Upper (arms, wrists, hands, fingers): None, normal Lower (legs, knees, ankles, toes): None, normal, Trunk Movements Neck, shoulders, hips: None, normal, Overall Severity Severity of abnormal movements (highest score from questions above): None, normal Incapacitation due to abnormal movements: None, normal Patient's awareness of abnormal movements (rate only patient's report): No Awareness, Dental Status Current problems with teeth and/or dentures?: No Does patient usually wear dentures?: No  CIWA:  CIWA-Ar Total: 2 COWS:  COWS Total Score: 7  Psychiatric Specialty Exam: See Psychiatric Specialty Exam and Suicide Risk Assessment completed by Attending Physician prior to discharge.  Discharge destination:  Home  Is patient on multiple antipsychotic therapies at discharge:  No  Has Patient had three or more failed trials of antipsychotic monotherapy by history:  No  Recommended Plan for Multiple Antipsychotic Therapies: NA    Medication List    STOP taking these medications       dicyclomine 20 MG  tablet  Commonly known as:  BENTYL     methylphenidate 30 MG/9HR  Commonly known as:  DAYTRANA     naproxen 500 MG tablet  Commonly known as:  NAPROSYN      TAKE these medications     Indication   atomoxetine 80 MG capsule  Commonly known as:  STRATTERA  Take 1 capsule (80 mg total) by mouth daily. For ADHD  Start taking on:  11/22/2013   Indication:  Attention Deficit Hyperactivity Disorder     DSS 100 MG Caps  Take 100 mg by mouth 2 (two) times daily. (May buy from over the counter at the pharmacy): For constipation   Indication:  Constipation     hydrOXYzine 25 MG tablet  Commonly known as:  ATARAX/VISTARIL  Take 1 tablet (25 mg total) by mouth every 6 (six) hours as needed for anxiety.   Indication:  Tension, Anxiety     mirtazapine 30 MG tablet  Commonly known as:  REMERON  Take 1 tablet (30 mg total) by mouth at bedtime. For depression/sleep   Indication:  Trouble Sleeping, Major Depressive Disorder     OLANZapine zydis 5 MG disintegrating tablet  Commonly known as:  ZYPREXA  Take 1 tablet (5 mg) two times daily and 2 tablets (10 mg) at bedtime: For agitation/mood control   Indication:  For agitation/mood control     prazosin 1 MG capsule  Commonly known as:  MINIPRESS  Take 1 capsule (1 mg total) by mouth at bedtime. For nightmares   Indication:  Nightmares     sertraline 50 MG tablet  Commonly known as:  ZOLOFT  Take 1 tablet (50 mg total) by mouth daily. For depression   Indication:  Major Depressive Disorder     topiramate 25 MG tablet  Commonly known as:  TOPAMAX  Take 1 tablet (25 mg total) by mouth 2 (two) times daily. For mood stabilization   Indication:  mood instability       Follow-up Information   Follow up with [facility] On 11/26/2013. (Please present to [facility] at 8 am on this date for intake appointment. Please bring your [facility] I.D., medications, and clothing. Please call if you need to reschedule  appointment. )    Contact information:   Address: [address] [place], [place] [address] Phone: [phone] Fax: [phone]     Follow-up recommendations: Activity:  As tolerated Diet: As recommended by your primary care doctor. Keep all scheduled follow-up appointments as recommended.   Comments:  Take all your medications as prescribed by your mental healthcare provider. Report any adverse effects and or reactions from your medicines to your outpatient provider promptly. Patient is instructed and cautioned to not engage in alcohol and or illegal drug use while on prescription medicines. In the event of worsening symptoms, patient is instructed to call the crisis hotline, 911 and or go to the nearest ED for appropriate evaluation and treatment of symptoms. Follow-up with your primary care provider for your other medical issues, concerns and or health care needs.  Total Discharge Time:  Greater than 30 minutes.  Signed: Sanjuana Kava, PMHNP 11/21/2013, 3:10 PM I personally assessed the patient and formulated the plan Madie Reno A.  Sabra Heck, M.D.

## 2013-11-21 NOTE — Clinical Social Work Note (Signed)
CSW received message from [facility] Admissions Coordinator Funmi stating that patient has bed tomorrow 11/22/13. [facility] authorization [id number] from 11/21/13-11/27/13.  CSW contacted patient at [phone] to inform him. Patient reports that he will attempt to arrange transportation to facility tomorrow.  Samuella Bruin, MSW, [employer] Clinical Social Worker [facility] [phone]

## 2013-11-21 NOTE — Clinical Social Work Note (Signed)
CSW left voicemail for [facility] Party [facility] regarding referral status.  CSW received call from [facility] Admissions Coordinator Funmi who requested that updated progress notes and medications be sent for review. Information faxed.  Samuella Bruin, MSW, [employer] Clinical Social Worker [facility] [phone]

## 2013-11-22 NOTE — Clinical Social Work Note (Signed)
CSW faxed patient's discharge summary to [facility] for review, as patient has bed for today.  Samuella Bruin, MSW, [employer] Clinical Social Worker [facility] [phone]

## 2013-11-26 NOTE — Progress Notes (Signed)
Patient Discharge Instructions:  After Visit Summary (AVS):   Faxed to:  11/26/13 Discharge Summary Note:   Faxed to:  11/26/13 Psychiatric Admission Assessment Note:   Faxed to:  11/26/13 Suicide Risk Assessment - Discharge Assessment:   Faxed to:  11/26/13 Faxed/Sent to the Next Level Care provider:  11/26/13 Faxed to [facility] @ [phone]  Jerelene Redden, 11/26/2013, 2:09 PM

## 2014-12-05 ENCOUNTER — Emergency Department (HOSPITAL_BASED_OUTPATIENT_CLINIC_OR_DEPARTMENT_OTHER)
Admission: EM | Admit: 2014-12-05 | Discharge: 2014-12-05 | Disposition: A | Discharge status: Home / Self Care | Payer: Self-pay | Attending: Emergency Medicine | Admitting: Emergency Medicine

## 2014-12-05 ENCOUNTER — Encounter (HOSPITAL_BASED_OUTPATIENT_CLINIC_OR_DEPARTMENT_OTHER): Payer: Self-pay | Admitting: *Deleted

## 2014-12-05 ENCOUNTER — Emergency Department (HOSPITAL_BASED_OUTPATIENT_CLINIC_OR_DEPARTMENT_OTHER): Payer: Self-pay

## 2014-12-05 DIAGNOSIS — S6992XA Unspecified injury of left wrist, hand and finger(s), initial encounter: Secondary | ICD-10-CM | POA: Insufficient documentation

## 2014-12-05 DIAGNOSIS — Y9389 Activity, other specified: Secondary | ICD-10-CM | POA: Insufficient documentation

## 2014-12-05 DIAGNOSIS — F419 Anxiety disorder, unspecified: Secondary | ICD-10-CM | POA: Insufficient documentation

## 2014-12-05 DIAGNOSIS — W228XXA Striking against or struck by other objects, initial encounter: Secondary | ICD-10-CM | POA: Insufficient documentation

## 2014-12-05 DIAGNOSIS — Z79899 Other long term (current) drug therapy: Secondary | ICD-10-CM | POA: Insufficient documentation

## 2014-12-05 DIAGNOSIS — R200 Anesthesia of skin: Secondary | ICD-10-CM | POA: Insufficient documentation

## 2014-12-05 DIAGNOSIS — Z720 Tobacco use: Secondary | ICD-10-CM | POA: Insufficient documentation

## 2014-12-05 DIAGNOSIS — Y9289 Other specified places as the place of occurrence of the external cause: Secondary | ICD-10-CM | POA: Insufficient documentation

## 2014-12-05 DIAGNOSIS — R202 Paresthesia of skin: Secondary | ICD-10-CM | POA: Insufficient documentation

## 2014-12-05 DIAGNOSIS — F329 Major depressive disorder, single episode, unspecified: Secondary | ICD-10-CM | POA: Insufficient documentation

## 2014-12-05 DIAGNOSIS — M79642 Pain in left hand: Secondary | ICD-10-CM

## 2014-12-05 DIAGNOSIS — Y998 Other external cause status: Secondary | ICD-10-CM | POA: Insufficient documentation

## 2014-12-05 MED ORDER — NAPROXEN 250 MG PO TABS: 250.0000 mg | ORAL_TABLET | Freq: Two times a day (BID) | ORAL | Status: DC

## 2014-12-05 NOTE — ED Notes (Signed)
ED PA at [facility] 

## 2014-12-05 NOTE — ED Provider Notes (Signed)
CSN: [id number]     Arrival date & time 12/05/14  1812 History   First MD Initiated Contact with Patient 12/05/14 1854     Chief Complaint  Patient presents with  . Hand Injury   Mason Jones is a 29 y.o. left hand dominant male with a history of drug abuse and previous left hand fracture who presents to the ED complaining of left hand pain after he punched a door earlier today. The patient is at [facility] for [facility] and became upset when he punched a door with his left hand. He complains of pain over the medial aspect of his left hand. He currently completed 2 out of 10 pain and is taking nothing for treatment today. He reports some numbness and tingling over the dorsal aspect of his left hand. He denies any weakness. He denies any left wrist, elbow or shoulder pain.  (Consider location/radiation/quality/duration/timing/severity/associated sxs/prior Treatment) HPI  Past Medical History  Diagnosis Date  . Drug abuse   . Anxiety   . Depression    History reviewed. No pertinent past surgical history. No family history on file. Social History  Substance Use Topics  . Smoking status: Current Every Day Smoker -- 1.00 packs/day    Types: Cigarettes  . Smokeless tobacco: None  . Alcohol Use: Yes     Comment: 1-2 times per week    Review of Systems  Constitutional: Negative for fever.  Musculoskeletal: Positive for joint swelling.  Skin: Negative for color change, rash and wound.  Neurological: Positive for numbness. Negative for weakness.      Allergies  Review of patient's allergies indicates no known allergies.  Home Medications   Prior to Admission medications   Medication Sig Start Date End Date Taking? Authorizing Provider  Citalopram Hydrobromide (CELEXA PO) Take by mouth.   Yes Historical Provider, MD  GABAPENTIN PO Take by mouth.   Yes Historical Provider, MD  atomoxetine (STRATTERA) 80 MG capsule Take 1 capsule (80 mg total) by mouth daily. For ADHD 11/22/13    Sanjuana Kava, NP  Docusate Sodium (DSS) 100 MG CAPS Take 100 mg by mouth 2 (two) times daily. (May buy from over the counter at the pharmacy): For constipation 11/21/13   Sanjuana Kava, NP  hydrOXYzine (ATARAX/VISTARIL) 25 MG tablet Take 1 tablet (25 mg total) by mouth every 6 (six) hours as needed for anxiety. 11/21/13   Sanjuana Kava, NP  mirtazapine (REMERON) 30 MG tablet Take 1 tablet (30 mg total) by mouth at bedtime. For depression/sleep 11/21/13   Sanjuana Kava, NP  naproxen (NAPROSYN) 250 MG tablet Take 1 tablet (250 mg total) by mouth 2 (two) times daily with a meal. 12/05/14   Everlene Farrier, PA-C  OLANZapine zydis (ZYPREXA) 5 MG disintegrating tablet Take 1 tablet (5 mg) two times daily and 2 tablets (10 mg) at bedtime: For agitation/mood control 11/21/13   Sanjuana Kava, NP  prazosin (MINIPRESS) 1 MG capsule Take 1 capsule (1 mg total) by mouth at bedtime. For nightmares 11/21/13   Sanjuana Kava, NP  sertraline (ZOLOFT) 50 MG tablet Take 1 tablet (50 mg total) by mouth daily. For depression 11/21/13   Sanjuana Kava, NP  topiramate (TOPAMAX) 25 MG tablet Take 1 tablet (25 mg total) by mouth 2 (two) times daily. For mood stabilization 11/21/13   Sanjuana Kava, NP   BP 136/73 mmHg  Pulse 84  Temp(Src) 98.2 F (36.8 C) (Oral)  Resp 20  Ht 5'  10" (1.778 m)  Wt 160 lb (72.576 kg)  BMI 22.96 kg/m2  SpO2 98% Physical Exam  Constitutional: He appears well-developed and well-nourished. No distress.  HENT:  Head: Normocephalic and atraumatic.  Eyes: Right eye exhibits no discharge. Left eye exhibits no discharge.  Cardiovascular: Normal rate and intact distal pulses.   Bilateral radial pulses are intact. Patient has good capillary refill to his left distal fingertips.  Pulmonary/Chest: Effort normal. No respiratory distress.  Musculoskeletal: Normal range of motion. He exhibits edema and tenderness.  There is mild edema over the fifth metacarpal is left hand. Patient has good range of motion of  his left hand. He has normal medical alignment. No snuffbox tenderness. No obvious deformity. Patient has no tenderness over his knuckles. Patient's tenderness is more proximal to his knuckles and closer to his wrist. No abrasions or open wounds. No left wrist or elbow tenderness. Patient is able to make a fist with knuckles in normal alignment.  Neurological: He is alert. Coordination normal.  Patient sensation intact in his bilateral distal hands.  Skin: Skin is warm and dry. No rash noted. He is not diaphoretic. No erythema. No pallor.  Psychiatric: He has a normal mood and affect. His behavior is normal.  Nursing note and vitals reviewed.   ED Course  Procedures (including critical care time) Labs Review Labs Reviewed - No data to display  Imaging Review Dg Hand Complete Left  12/05/2014   CLINICAL DATA:  Punched a door. Complains of pain in the fifth metacarpal.  EXAM: LEFT HAND - COMPLETE 3+ VIEW  COMPARISON:  None  FINDINGS: Small density is seen adjacent to the base of the fifth proximal phalanx just distal to the metacarpophalangeal joint. Findings are consistent with small avulsion injury or radiopaque foreign body. Correlation is recommended with mechanism of injury and history. There is no other evidence for acute fracture or subluxation.  IMPRESSION: 1. Small avulsion injury versus foreign body adjacent to the fifth metacarpophalangeal joint. No other evidence for acute abnormality.   Electronically Signed   By: Norva Pavlov M.D.   On: 12/05/2014 19:23   I have personally reviewed and evaluated these images as part of my medical decision-making.   EKG Interpretation None      Filed Vitals:   12/05/14 1821  BP: 136/73  Pulse: 84  Temp: 98.2 F (36.8 C)  TempSrc: Oral  Resp: 20  Height:  (1.778 m)  Weight: 160 lb (72.576 kg)  SpO2: 98%     MDM   Meds given in ED:  Medications - No data to display  New Prescriptions   NAPROXEN (NAPROSYN) 250 MG TABLET     Take 1 tablet (250 mg total) by mouth 2 (two) times daily with a meal.    Final diagnoses:  Left hand pain   This is a 29 y.o. left hand dominant male with a history of drug abuse and previous left hand fracture who presents to the ED complaining of left hand pain after he punched a door earlier today.  On exam the patient has mild edema over the dorsal aspect of his fifth metacarpal. There is no obvious deformity. Patient is able to make a fist with his knuckles in normal alignment. He has no tenderness over his knuckles. His tenderness is more proximal to his knuckles. Obvious deformity or fracture. No open wounds. Left hand x-ray indicates a small avulsion injury versus foreign body adjacent to the fifth metacarpal pharyngeal joint. No other evidence of  acute abnormality. On exam the patient has no tenderness over the area of foreign body or avulsion injury. There is no open wounds or evidence of foreign body present. As there is a potential for an avulsion injury will place the patient in an ulnar gutter splint and have him follow-up with hand surgery. I advised the patient to follow-up with their primary care provider this week. I advised the patient to return to the emergency department with new or worsening symptoms or new concerns. The patient verbalized understanding and agreement with plan.    This patient was discussed with Dr. Littie Deeds who agrees with assessment and plan.    Everlene Farrier, PA-C 12/05/14 1955  Mirian Mo, MD 12/16/14 [phone]

## 2014-12-05 NOTE — ED Notes (Signed)
From [facility] C.o pain in his left hand after hitting a door earlier today.

## 2014-12-05 NOTE — Discharge Instructions (Signed)
X-rays today indicate a possible avulsion fracture in your hand. Please follow up with hand surgery.  Possible Hand Fracture, Metacarpals Fractures of metacarpals are breaks in the bones of the hand. They extend from the knuckles to the wrist. These bones can undergo many types of fractures. There are different ways of treating these fractures, all of which may be correct. TREATMENT  Hand fractures can be treated with:   Non-reduction - The fracture is casted without changing the positions of the fracture (bone pieces) involved. This fracture is usually left in a cast for 4 to 6 weeks or as your caregiver thinks necessary.  Closed reduction - The bones are moved back into position without surgery and then casted.  ORIF (open reduction and internal fixation) - The fracture site is opened and the bone pieces are fixed into place with some type of hardware, such as screws, etc. They are then casted. Your caregiver will discuss the type of fracture you have and the treatment that should be best for that problem. If surgery is chosen, let your caregivers know about the following.  LET YOUR CAREGIVERS KNOW ABOUT:  Allergies.  Medications you are taking, including herbs, eye drops, over the counter medications, and creams.  Use of steroids (by mouth or creams).  Previous problems with anesthetics or novocaine.  Possibility of pregnancy.  History of blood clots (thrombophlebitis).  History of bleeding or blood problems.  Previous surgeries.  Other health problems. AFTER THE PROCEDURE After surgery, you will be taken to the recovery area where a nurse will watch and check your progress. Once you are awake, stable, and taking fluids well, barring other problems, you'll be allowed to go home. Once home, an ice pack applied to your operative site may help with pain and keep the swelling down. HOME CARE INSTRUCTIONS   Follow your caregiver's instructions as to activities, exercises, physical  therapy, and driving a car.  Daily exercise is helpful for keeping range of motion and strength. Exercise as instructed.  To lessen swelling, keep the injured hand elevated above the level of your heart as much as possible.  Apply ice to the injury for 15-20 minutes each hour while awake for the first 2 days. Put the ice in a plastic bag and place a thin towel between the bag of ice and your cast.  Move the fingers of your casted hand several times a day.  If a plaster or fiberglass cast was applied:  Do not try to scratch the skin under the cast using a sharp or pointed object.  Check the skin around the cast every day. You may put lotion on red or sore areas.  Keep your cast dry. Your cast can be protected during bathing with a plastic bag. Do not put your cast into the water.  If a plaster splint was applied:  Wear your splint for as long as directed by your caregiver or until seen again.  Do not get your splint wet. Protect it during bathing with a plastic bag.  You may loosen the elastic bandage around the splint if your fingers start to get numb, tingle, get cold or turn blue.  Do not put pressure on your cast or splint; this may cause it to break. Especially, do not lean plaster casts on hard surfaces for 24 hours after application.  Take medications as directed by your caregiver.  Only take over-the-counter or prescription medicines for pain, discomfort, or fever as directed by your caregiver.  Follow-up as  provided by your caregiver. This is very important in order to avoid permanent injury or disability and chronic pain. SEEK MEDICAL CARE IF:   Increased bleeding (more than a small spot) from beneath your cast or splint if there is beneath the cast as with an open reduction.  Redness, swelling, or increasing pain in the wound or from beneath your cast or splint.  Pus coming from wound or from beneath your cast or splint.  An unexplained oral temperature above 102 F  (38.9 C) develops, or as your caregiver suggests.  A foul smell coming from the wound or dressing or from beneath your cast or splint.  You have a problem moving any of your fingers. SEEK IMMEDIATE MEDICAL CARE IF:   You develop a rash  You have difficulty breathing  You have any allergy problems If you do not have a window in your cast for observing the wound, a discharge or minor bleeding may show up as a stain on the outside of your cast. Report these findings to your caregiver. MAKE SURE YOU:   Understand these instructions.  Will watch your condition.  Will get help right away if you are not doing well or get worse. Document Released: 03/15/2005 Document Revised: 06/07/2011 Document Reviewed: 11/02/2007 [facility] Patient Information 2015 [place], [place]. This information is not intended to replace advice given to you by your health care provider. Make sure you discuss any questions you have with your health care provider. Cast or Splint Care Casts and splints support injured limbs and keep bones from moving while they heal. It is important to care for your cast or splint at home.  HOME CARE INSTRUCTIONS  Keep the cast or splint uncovered during the drying period. It can take 24 to 48 hours to dry if it is made of plaster. A fiberglass cast will dry in less than 1 hour.  Do not rest the cast on anything harder than a pillow for the first 24 hours.  Do not put weight on your injured limb or apply pressure to the cast until your health care provider gives you permission.  Keep the cast or splint dry. Wet casts or splints can lose their shape and may not support the limb as well. A wet cast that has lost its shape can also create harmful pressure on your skin when it dries. Also, wet skin can become infected.  Cover the cast or splint with a plastic bag when bathing or when out in the rain or snow. If the cast is on the trunk of the body, take sponge baths until the cast is  removed.  If your cast does become wet, dry it with a towel or a blow dryer on the cool setting only.  Keep your cast or splint clean. Soiled casts may be wiped with a moistened cloth.  Do not place any hard or soft foreign objects under your cast or splint, such as cotton, toilet paper, lotion, or powder.  Do not try to scratch the skin under the cast with any object. The object could get stuck inside the cast. Also, scratching could lead to an infection. If itching is a problem, use a blow dryer on a cool setting to relieve discomfort.  Do not trim or cut your cast or remove padding from inside of it.  Exercise all joints next to the injury that are not immobilized by the cast or splint. For example, if you have a long leg cast, exercise the hip joint and  toes. If you have an arm cast or splint, exercise the shoulder, elbow, thumb, and fingers.  Elevate your injured arm or leg on 1 or 2 pillows for the first 1 to 3 days to decrease swelling and pain.It is best if you can comfortably elevate your cast so it is higher than your heart. SEEK MEDICAL CARE IF:   Your cast or splint cracks.  Your cast or splint is too tight or too loose.  You have unbearable itching inside the cast.  Your cast becomes wet or develops a soft spot or area.  You have a bad smell coming from inside your cast.  You get an object stuck under your cast.  Your skin around the cast becomes red or raw.  You have new pain or worsening pain after the cast has been applied. SEEK IMMEDIATE MEDICAL CARE IF:   You have fluid leaking through the cast.  You are unable to move your fingers or toes.  You have discolored (blue or white), cool, painful, or very swollen fingers or toes beyond the cast.  You have tingling or numbness around the injured area.  You have severe pain or pressure under the cast.  You have any difficulty with your breathing or have shortness of breath.  You have chest pain. Document  Released: 03/12/2000 Document Revised: 01/03/2013 Document Reviewed: 09/21/2012 [facility] Patient Information 2015 [place], [place]. This information is not intended to replace advice given to you by your health care provider. Make sure you discuss any questions you have with your health care provider.

## 2015-01-11 ENCOUNTER — Emergency Department (HOSPITAL_COMMUNITY)
Admission: EM | Admit: 2015-01-11 | Discharge: 2015-01-12 | Disposition: A | Discharge status: Other Acute Inpatient Hospital | Payer: Self-pay | Attending: Emergency Medicine | Admitting: Emergency Medicine

## 2015-01-11 ENCOUNTER — Encounter (HOSPITAL_COMMUNITY): Payer: Self-pay | Admitting: Emergency Medicine

## 2015-01-11 DIAGNOSIS — Z79899 Other long term (current) drug therapy: Secondary | ICD-10-CM | POA: Insufficient documentation

## 2015-01-11 DIAGNOSIS — Z791 Long term (current) use of non-steroidal anti-inflammatories (NSAID): Secondary | ICD-10-CM | POA: Insufficient documentation

## 2015-01-11 DIAGNOSIS — F329 Major depressive disorder, single episode, unspecified: Secondary | ICD-10-CM | POA: Insufficient documentation

## 2015-01-11 DIAGNOSIS — F4310 Post-traumatic stress disorder, unspecified: Secondary | ICD-10-CM | POA: Insufficient documentation

## 2015-01-11 DIAGNOSIS — Z720 Tobacco use: Secondary | ICD-10-CM | POA: Insufficient documentation

## 2015-01-11 DIAGNOSIS — F419 Anxiety disorder, unspecified: Secondary | ICD-10-CM | POA: Insufficient documentation

## 2015-01-11 DIAGNOSIS — R45851 Suicidal ideations: Secondary | ICD-10-CM

## 2015-01-11 DIAGNOSIS — F32A Depression, unspecified: Secondary | ICD-10-CM

## 2015-01-11 HISTORY — DX: Post-traumatic stress disorder, unspecified: F43.10

## 2015-01-11 LAB — CBC
HEMATOCRIT: 43.7 % (ref 39.0–52.0)
HEMOGLOBIN: 15.4 g/dL (ref 13.0–17.0)
MCH: 32.6 pg (ref 26.0–34.0)
MCHC: 35.2 g/dL (ref 30.0–36.0)
MCV: 92.4 fL (ref 78.0–100.0)
PLATELETS: 247 10*3/uL (ref 150–400)
RBC: 4.73 MIL/uL (ref 4.22–5.81)
RDW: 12.2 % (ref 11.5–15.5)
WBC: 6.8 10*3/uL (ref 4.0–10.5)

## 2015-01-11 NOTE — ED Notes (Signed)
Pt arrived to the ED with a complaint of suicidal ideation.  Pt is a resident of [facility].  Pt felt that the medications he was being given where not adequate so he went to [facility] and began being tapered off.  Pt states that was a bad idea.  Pt states for the last three weeks that he has been having thoughts of cutting himself.  Pt states he wants to cut his wrists.  Pt has a past history of suicide attempts by cutting his wrists.  Pt states he is severely depressed and crys for no reason then has outbursts of anger.

## 2015-01-12 ENCOUNTER — Inpatient Hospital Stay (HOSPITAL_COMMUNITY)
Admission: AD | Admit: 2015-01-12 | Discharge: 2015-01-15 | DRG: 885 | Disposition: A | Discharge status: Home / Self Care | Payer: Federal, State, Local not specified - Other | Source: Intra-hospital | Attending: Psychiatry | Admitting: Psychiatry

## 2015-01-12 ENCOUNTER — Encounter (HOSPITAL_COMMUNITY): Payer: Self-pay | Admitting: *Deleted

## 2015-01-12 DIAGNOSIS — F4310 Post-traumatic stress disorder, unspecified: Secondary | ICD-10-CM | POA: Diagnosis present

## 2015-01-12 DIAGNOSIS — F1320 Sedative, hypnotic or anxiolytic dependence, uncomplicated: Secondary | ICD-10-CM | POA: Diagnosis present

## 2015-01-12 DIAGNOSIS — Z62811 Personal history of psychological abuse in childhood: Secondary | ICD-10-CM | POA: Diagnosis present

## 2015-01-12 DIAGNOSIS — F1220 Cannabis dependence, uncomplicated: Secondary | ICD-10-CM | POA: Diagnosis present

## 2015-01-12 DIAGNOSIS — Z62810 Personal history of physical and sexual abuse in childhood: Secondary | ICD-10-CM | POA: Diagnosis present

## 2015-01-12 DIAGNOSIS — R45851 Suicidal ideations: Secondary | ICD-10-CM | POA: Diagnosis present

## 2015-01-12 DIAGNOSIS — F332 Major depressive disorder, recurrent severe without psychotic features: Secondary | ICD-10-CM | POA: Diagnosis not present

## 2015-01-12 DIAGNOSIS — F17210 Nicotine dependence, cigarettes, uncomplicated: Secondary | ICD-10-CM | POA: Diagnosis present

## 2015-01-12 DIAGNOSIS — F1120 Opioid dependence, uncomplicated: Secondary | ICD-10-CM | POA: Diagnosis present

## 2015-01-12 DIAGNOSIS — F411 Generalized anxiety disorder: Secondary | ICD-10-CM | POA: Diagnosis present

## 2015-01-12 DIAGNOSIS — F339 Major depressive disorder, recurrent, unspecified: Secondary | ICD-10-CM | POA: Diagnosis present

## 2015-01-12 LAB — RAPID URINE DRUG SCREEN, HOSP PERFORMED
AMPHETAMINES: NOT DETECTED
BARBITURATES: NOT DETECTED
Benzodiazepines: NOT DETECTED
Cocaine: NOT DETECTED
OPIATES: NOT DETECTED
TETRAHYDROCANNABINOL: NOT DETECTED

## 2015-01-12 LAB — SALICYLATE LEVEL: Salicylate Lvl: <4 mg/dL (ref 2.8–30.0)

## 2015-01-12 LAB — COMPREHENSIVE METABOLIC PANEL
ALBUMIN: 4.8 g/dL (ref 3.5–5.0)
ALT: 16 U/L — ABNORMAL LOW (ref 17–63)
AST: 25 U/L (ref 15–41)
Alkaline Phosphatase: 66 U/L (ref 38–126)
Anion gap: 6 (ref 5–15)
BUN: 14 mg/dL (ref 6–20)
CHLORIDE: 105 mmol/L (ref 101–111)
CO2: 27 mmol/L (ref 22–32)
Calcium: 9.2 mg/dL (ref 8.9–10.3)
Creatinine, Ser: 0.91 mg/dL (ref 0.61–1.24)
GFR calc Af Amer: >60 mL/min (ref >60)
GLUCOSE: 96 mg/dL (ref 65–99)
POTASSIUM: 3.7 mmol/L (ref 3.5–5.1)
Sodium: 138 mmol/L (ref 135–145)
TOTAL PROTEIN: 7.7 g/dL (ref 6.5–8.1)
Total Bilirubin: 0.5 mg/dL (ref 0.3–1.2)

## 2015-01-12 LAB — ETHANOL

## 2015-01-12 LAB — ACETAMINOPHEN LEVEL: Acetaminophen (Tylenol), Serum: <10 ug/mL — ABNORMAL LOW (ref 10–30)

## 2015-01-12 MED ORDER — TOPIRAMATE 25 MG PO TABS
25.0000 mg | ORAL_TABLET | Freq: Two times a day (BID) | ORAL | Status: DC
  Filled 2015-01-12: qty 1

## 2015-01-12 MED ORDER — TOPIRAMATE 25 MG PO TABS
25.0000 mg | ORAL_TABLET | Freq: Two times a day (BID) | ORAL | Status: DC
  Filled 2015-01-12 (×4): qty 1

## 2015-01-12 MED ORDER — NICOTINE POLACRILEX 2 MG MT GUM
2.0000 mg | CHEWING_GUM | OROMUCOSAL | Status: DC | PRN
  Administered 2015-01-12 – 2015-01-15 (×16): 2 mg via ORAL
  Filled 2015-01-12 (×7): qty 1
  Filled 2015-01-12: qty 40
  Filled 2015-01-12: qty 1

## 2015-01-12 MED ORDER — OLANZAPINE 10 MG PO TBDP: 10.0000 mg | ORAL_TABLET | Freq: Every day | ORAL | Status: DC

## 2015-01-12 MED ORDER — MIRTAZAPINE 30 MG PO TABS
30.0000 mg | ORAL_TABLET | Freq: Every day | ORAL | Status: DC
  Filled 2015-01-12: qty 1

## 2015-01-12 MED ORDER — ATOMOXETINE HCL 40 MG PO CAPS
80.0000 mg | ORAL_CAPSULE | Freq: Every day | ORAL | Status: DC
  Filled 2015-01-12 (×2): qty 2

## 2015-01-12 MED ORDER — CITALOPRAM HYDROBROMIDE 20 MG PO TABS
20.0000 mg | ORAL_TABLET | Freq: Every day | ORAL | Status: DC
  Administered 2015-01-13 – 2015-01-15 (×3): 20 mg via ORAL
  Filled 2015-01-12: qty 1
  Filled 2015-01-12: qty 7
  Filled 2015-01-12 (×3): qty 1

## 2015-01-12 MED ORDER — CITALOPRAM HYDROBROMIDE 10 MG PO TABS
10.0000 mg | ORAL_TABLET | Freq: Every day | ORAL | Status: DC
Start: 2015-01-12 — End: 2015-01-12
  Filled 2015-01-12: qty 1

## 2015-01-12 MED ORDER — DSS 100 MG PO CAPS: 100.0000 mg | ORAL_CAPSULE | Freq: Two times a day (BID) | ORAL | Status: DC

## 2015-01-12 MED ORDER — ATOMOXETINE HCL 40 MG PO CAPS
80.0000 mg | ORAL_CAPSULE | Freq: Every day | ORAL | Status: DC
  Filled 2015-01-12: qty 2

## 2015-01-12 MED ORDER — CITALOPRAM HYDROBROMIDE 10 MG PO TABS
10.0000 mg | ORAL_TABLET | Freq: Every day | ORAL | Status: DC
  Administered 2015-01-12: 10 mg via ORAL
  Filled 2015-01-12 (×3): qty 1

## 2015-01-12 MED ORDER — SERTRALINE HCL 50 MG PO TABS: 50.0000 mg | ORAL_TABLET | Freq: Every day | ORAL | Status: DC

## 2015-01-12 MED ORDER — CITALOPRAM HYDROBROMIDE 10 MG PO TABS
10.0000 mg | ORAL_TABLET | Freq: Every day | ORAL | Status: AC
  Administered 2015-01-12: 10 mg via ORAL
  Filled 2015-01-12: qty 1

## 2015-01-12 MED ORDER — HYDROXYZINE HCL 25 MG PO TABS: 25.0000 mg | ORAL_TABLET | Freq: Four times a day (QID) | ORAL | Status: DC | PRN

## 2015-01-12 MED ORDER — GABAPENTIN 100 MG PO CAPS
200.0000 mg | ORAL_CAPSULE | Freq: Two times a day (BID) | ORAL | Status: DC
  Administered 2015-01-12 – 2015-01-15 (×7): 200 mg via ORAL
  Filled 2015-01-12: qty 2
  Filled 2015-01-12: qty 42
  Filled 2015-01-12 (×9): qty 2
  Filled 2015-01-12: qty 42

## 2015-01-12 MED ORDER — SERTRALINE HCL 50 MG PO TABS
50.0000 mg | ORAL_TABLET | Freq: Every day | ORAL | Status: DC
  Administered 2015-01-12: 50 mg via ORAL
  Filled 2015-01-12 (×3): qty 1

## 2015-01-12 MED ORDER — ACETAMINOPHEN 325 MG PO TABS: 650.0000 mg | ORAL_TABLET | ORAL | Status: DC | PRN

## 2015-01-12 MED ORDER — PRAZOSIN HCL 1 MG PO CAPS
1.0000 mg | ORAL_CAPSULE | Freq: Every day | ORAL | Status: DC
  Filled 2015-01-12: qty 1

## 2015-01-12 MED ORDER — HYDROXYZINE HCL 25 MG PO TABS
25.0000 mg | ORAL_TABLET | Freq: Four times a day (QID) | ORAL | Status: DC | PRN
  Administered 2015-01-12: 25 mg via ORAL
  Filled 2015-01-12: qty 1

## 2015-01-12 MED ORDER — NICOTINE 21 MG/24HR TD PT24
21.0000 mg | MEDICATED_PATCH | Freq: Every day | TRANSDERMAL | Status: DC
  Filled 2015-01-12: qty 1

## 2015-01-12 MED ORDER — NICOTINE POLACRILEX 2 MG MT GUM
2.0000 mg | CHEWING_GUM | OROMUCOSAL | Status: DC | PRN
  Filled 2015-01-12: qty 1

## 2015-01-12 MED ORDER — NICOTINE 21 MG/24HR TD PT24: 21.0000 mg | MEDICATED_PATCH | Freq: Every day | TRANSDERMAL | Status: DC

## 2015-01-12 MED ORDER — PRAZOSIN HCL 1 MG PO CAPS
1.0000 mg | ORAL_CAPSULE | Freq: Every day | ORAL | Status: DC
  Filled 2015-01-12 (×3): qty 1

## 2015-01-12 NOTE — Progress Notes (Signed)
Pt admitted to Adult Unit. Pt presents calm and cooperative.  +SI w/plan to cut his wrist. -HI/A/V/H.  Reports increased depression, difficulty sleeping, anger outbursts and crying episodes. Reports stressors being medication changes, conflict with girlfriend and the recovery process.  Reports long history of depression, suicide attempts and substance abuse (primarily opiates and heroin).  He is currently a resident of [facility].  Also reports he has a court date 02/10/15 for a charge of misdemeanor larceny. Emotional support and encouragement given.  Pt admitted for evaluation, stabilization and reduction of baseline
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
## 2015-01-12 NOTE — BHH Suicide Risk Assessment (Signed)
[facility] Family/Significant Other Suicide Prevention Education  Suicide Prevention Education:   Patient Refusal for Family/Significant Other Suicide Prevention Education: The patient has refused to provide written consent for family/significant other to be provided Family/Significant Other Suicide Prevention Education during admission and/or prior to discharge.  Physician notified.  CSW provided suicide prevention information with patient.    The suicide prevention education provided includes the following:  Suicide risk factors  Suicide prevention and interventions  National Suicide Hotline telephone number  [facility] assessment telephone number  [facility] Emergency Assistance 911  [facility] and/or [facility]idential [facility] Crisis Unit telephone number   Reyes IvanChelsea Horton, [place]CS[place] 01/12/2015 11:55 AM

## 2015-01-12 NOTE — ED Notes (Signed)
Patient appears calm, cooperative. Reports passive SI. Denies HI, aVH. Rates feelings of anxiety 4/10, depression 8/10. Patient states he needs his medications adjusted. SI has been increased over last three weeks. Patient [facility] and still has belongings [facility]. Reports his medications have been tapered recently.   Encouragement offered. Oriented to the unit. Meal provided.  Q 15 safety checks in place.

## 2015-01-12 NOTE — ED Notes (Signed)
Bed: [id number] Expected date: 01/11/15 Expected time:  Means of arrival:  Comments: Hold for J Hammonds

## 2015-01-12 NOTE — Progress Notes (Signed)
D:Per patient self inventory form pt reports he slept fair last night. He reports a fair appetite, normal energy level, good concentration. He rates depression 4/10, hopelessness 4/10, anxiety 2/10, all on 0-10 scale, 10 being the worse. Pt denies S/S of withdrawal. He denies physical pain. Pt reports his goal for the day is "meds adjustment, stabilization" Pt reports he will meet his goal by "take meds attend group." Pt knowledgeable in regards to his medication regimen, Pt denies SI/HI. Denies physical pain.   A:Special checks q 15 mins in place for safety. Medication administered per MD order (see eMAR). Encouragement and support provided. Medication education provided.  R: Safety maintained. Compliant with medication regimen. Will continue to monitor.

## 2015-01-12 NOTE — ED Provider Notes (Signed)
CSN: [id number]     Arrival date & time 01/11/15  2309 History   First MD Initiated Contact with Patient 01/11/15 2342     Chief Complaint  Patient presents with  . Suicidal     (Consider location/radiation/quality/duration/timing/severity/associated sxs/prior Treatment) HPI Comments: Patient with a history of polysubstance abuse, currently a resident at [facility], depression, suicide attempt, presents with increasing depression and suicidal thoughts, with plan to cut his wrists. He reports difficulty sleeping, disinterest, frequent crying episodes. He denies symptoms of illness, vomiting, pain or self harm.   The history is provided by the patient. No language interpreter was used.    Past Medical History  Diagnosis Date  . Drug abuse   . Anxiety   . Depression   . PTSD (post-traumatic stress disorder)    History reviewed. No pertinent past surgical history. History reviewed. No pertinent family history. Social History  Substance Use Topics  . Smoking status: Current Every Day Smoker -- 1.00 packs/day    Types: Cigarettes  . Smokeless tobacco: None  . Alcohol Use: Yes     Comment: 1-2 times per week    Review of Systems  Constitutional: Negative for fever and chills.  HENT: Negative.   Respiratory: Negative.   Cardiovascular: Negative.   Gastrointestinal: Negative.   Musculoskeletal: Negative.   Skin: Negative.   Neurological: Negative.   Psychiatric/Behavioral: Positive for suicidal ideas, sleep disturbance and dysphoric mood.      Allergies  Review of patient's allergies indicates no known allergies.  Home Medications   Prior to Admission medications   Medication Sig Start Date End Date Taking? Authorizing Provider  atomoxetine (STRATTERA) 80 MG capsule Take 1 capsule (80 mg total) by mouth daily. For ADHD 11/22/13   Sanjuana Kava, NP  Citalopram Hydrobromide (CELEXA PO) Take by mouth.    Historical Provider, MD  Docusate Sodium (DSS) 100 MG  CAPS Take 100 mg by mouth 2 (two) times daily. (May buy from over the counter at the pharmacy): For constipation 11/21/13   Sanjuana Kava, NP  GABAPENTIN PO Take by mouth.    Historical Provider, MD  hydrOXYzine (ATARAX/VISTARIL) 25 MG tablet Take 1 tablet (25 mg total) by mouth every 6 (six) hours as needed for anxiety. 11/21/13   Sanjuana Kava, NP  mirtazapine (REMERON) 30 MG tablet Take 1 tablet (30 mg total) by mouth at bedtime. For depression/sleep 11/21/13   Sanjuana Kava, NP  naproxen (NAPROSYN) 250 MG tablet Take 1 tablet (250 mg total) by mouth 2 (two) times daily with a meal. 12/05/14   Everlene Farrier, PA-C  OLANZapine zydis (ZYPREXA) 5 MG disintegrating tablet Take 1 tablet (5 mg) two times daily and 2 tablets (10 mg) at bedtime: For agitation/mood control 11/21/13   Sanjuana Kava, NP  prazosin (MINIPRESS) 1 MG capsule Take 1 capsule (1 mg total) by mouth at bedtime. For nightmares 11/21/13   Sanjuana Kava, NP  sertraline (ZOLOFT) 50 MG tablet Take 1 tablet (50 mg total) by mouth daily. For depression 11/21/13   Sanjuana Kava, NP  topiramate (TOPAMAX) 25 MG tablet Take 1 tablet (25 mg total) by mouth 2 (two) times daily. For mood stabilization 11/21/13   Sanjuana Kava, NP   BP 145/63 mmHg  Pulse 63  Temp(Src) 98.1 F (36.7 C) (Oral)  Resp 16  Ht  (1.778 m)  Wt 157 lb (71.215 kg)  BMI 22.53 kg/m2  SpO2 100% Physical Exam  Constitutional: He is  oriented to person, place, and time. He appears well-developed and well-nourished.  HENT:  Head: Normocephalic.  Neck: Normal range of motion. Neck supple.  Cardiovascular: Normal rate and regular rhythm.   Pulmonary/Chest: Effort normal and breath sounds normal.  Abdominal: Soft. Bowel sounds are normal. There is no tenderness. There is no rebound and no guarding.  Musculoskeletal: Normal range of motion.  Neurological: He is alert and oriented to person, place, and time.  Skin: Skin is warm and dry. No rash noted.  Psychiatric: He  exhibits a depressed mood. He expresses suicidal ideation. He expresses suicidal plans.    ED Course  Procedures (including critical care time) Labs Review Labs Reviewed  CBC  COMPREHENSIVE METABOLIC PANEL  ETHANOL  SALICYLATE LEVEL  ACETAMINOPHEN LEVEL  URINE RAPID DRUG SCREEN, HOSP PERFORMED    Imaging Review No results found. I have personally reviewed and evaluated these images and lab results as part of my medical decision-making.   EKG Interpretation None      MDM   Final diagnoses:  None    1. Suicidal ideation w/plan 2. History suicide attempt  TTS consultation requested to assess for placement for inpatient treatment.     Elpidio AnisShari Kenae Lindquist, PA-C 01/12/15 0010  Azalia BilisKevin Campos, MD 01/12/15 [phone]
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
## 2015-01-12 NOTE — Progress Notes (Signed)
Patient ID: Urbano HeirJames Penza, male   DOB: 06/28/1985, 29 y.o.   MRN: [id number]  Adult Psychoeducational Group Note  Date:  01/12/2015 Time:  09:30am  Group Topic/Focus:  Making Healthy Choices:   The focus of this group is to help patients identify negative/unhealthy choices they were using prior to admission and identify positive/healthier coping strategies to replace them upon discharge.  Participation Level:  Active  Participation Quality:  Appropriate  Affect:  Appropriate  Cognitive:  Appropriate  Insight: Appropriate  Engagement in Group:  Engaged  Modes of Intervention:  Activity, Education and Support  Additional Comments:  Pt attended and actively participated in group today.  Aurora Maskwyman, Colyn Miron E 01/12/2015, 10:13 AM

## 2015-01-12 NOTE — Tx Team (Signed)
Initial Interdisciplinary Treatment Plan   PATIENT STRESSORS: Legal issue Marital or family conflict Medication change or noncompliance Substance abuse   PATIENT STRENGTHS: Ability for insight Average or above average intelligence Capable of independent living Communication skills Motivation for treatment/growth Physical Health   PROBLEM LIST: Problem List/Patient Goals Date to be addressed Date deferred Reason deferred Estimated date of resolution  Depression "get stabilized on medication" 01/12/15   At d/c  Suicidal ideation "learn why I feel the way I do" 01/12/15   At d/c  Substance abuse 01/12/15   At d/c  Anxiety "help to get to the core of my problem" 01/12/15   At d/c                                 DISCHARGE CRITERIA:  Improved stabilization in mood, thinking, and/or behavior Motivation to continue treatment in a less acute level of care Need for constant or close observation no longer present Verbal commitment to aftercare and medication compliance  PRELIMINARY DISCHARGE PLAN: Outpatient therapy Return to previous living arrangement  PATIENT/FAMIILY INVOLVEMENT: This treatment plan has been presented to and reviewed with the patient, Mason Jones,   The patient and family have been given the opportunity to ask questions and make suggestions.  Mason Jones, Mason Jones 01/12/2015, 3:09 AM

## 2015-01-12 NOTE — [facility] Group Notes (Signed)
[facility] Group Notes:  (Clinical Social Work)  01/12/2015  10:00-11:00AM  Summary of Progress/Problems:   The main focus of today's process group was to   1)  discuss the importance of adding supports  2)  define health supports versus unhealthy supports  3)  identify the patient's current unhealthy supports and plan how to handle them  4)  Identify the patient's current healthy supports and plan what to add.  An emphasis was placed on using counselor, doctor, therapy groups, 12-step groups, and problem-specific support groups to expand supports.    The patient expressed full comprehension of the concepts presented, and agreed that there is a need to add more supports.  The patient stated he himself, his higher power, his network and his sponsor are his main supports.  He said his parents and fiancee both use, and for right now, that means he has made the decision to stay away from them.  He displayed significant insight that he was able to share with other patients.  Type of Therapy:  Process Group with Motivational Interviewing  Participation Level:  Active  Participation Quality:  Attentive, Sharing and Supportive  Affect:  Blunted  Cognitive:  Alert, Appropriate and Oriented  Insight:  Engaged  Engagement in Therapy:  Engaged  Modes of Intervention:   Education, Support and Processing, Activity  Ambrose MantleMareida Grossman-Orr, LCSW 01/12/2015

## 2015-01-12 NOTE — Plan of Care (Signed)
Problem: Diagnosis: Increased Risk For Suicide Attempt Goal: STG-Patient Will Comply With Medication Regime Outcome: Progressing Pt compliant with medication regimen     

## 2015-01-12 NOTE — Progress Notes (Signed)
Nutrition Brief Note  Patient identified on the Malnutrition Screening Tool (MST) Report  Patient's weight has remained between 150-160 lb, pt with weight gain since last year.  Wt Readings from Last 15 Encounters:  01/12/15 151 lb (68.493 kg)  01/11/15 157 lb (71.215 kg)  12/05/14 160 lb (72.576 kg)  11/13/13 137 lb (62.143 kg)  11/02/13 150 lb (68.04 kg)  10/03/13 137 lb 8 oz (62.37 kg)    Body mass index is 23.64 kg/(m^2). Patient meets criteria for normal range based on current BMI.   Diet Order: Diet regular Room service appropriate?: Yes; Fluid consistency:: Thin Pt is also offered choice of unit snacks mid-morning and mid-afternoon.  Labs and medications reviewed.   No nutrition interventions warranted at this time. If nutrition issues arise, please consult RD.   Tilda FrancoLindsey Marquell Saenz, MS, RD, LDN Pager: [phone] After Hours Pager: [phone]

## 2015-01-13 MED ORDER — ENSURE ENLIVE PO LIQD
237.0000 mL | Freq: Three times a day (TID) | ORAL | Status: DC
  Administered 2015-01-13 – 2015-01-15 (×6): 237 mL via ORAL

## 2015-01-13 NOTE — Progress Notes (Signed)
[facility] Group Notes:  (Nursing/MHT/Case Management/Adjunct)  Date:  01/13/2015  Time:  1:21 AM  Type of Therapy:  Psychoeducational Skills  Participation Level:  None  Participation Quality:  Resistant  Affect:  Flat and Labile  Cognitive:  Lacking  Insight:  Limited  Engagement in Group:  Poor and Resistant  Modes of Intervention:  Education  Summary of Progress/Problems: The patient refused to share anything with regards to his day. As a theme for the day, he indicated that his "sponsor" will be his support system.   Mason Jones S 01/13/2015, 1:21 AM

## 2015-01-13 NOTE — Plan of Care (Signed)
Problem: Alteration in mood & ability to function due to Goal: STG-Patient will comply with prescribed medication regimen (Patient will comply with prescribed medication regimen)  Outcome: Progressing Compliant with medications at this time.
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
## 2015-01-13 NOTE — Progress Notes (Signed)
Recreation Therapy Notes  Date: 10.17.2016 Time: 9:30am Location: [address] Dayroom   Group Topic: Stress Management  Goal Area(s) Addresses:  Patient will actively participate in stress management techniques presented during session.   Behavioral Response: Did not attend.   Quadre Bristol L Sayvion Vigen, LRT/CTRS        Starsky Nanna L 01/13/2015 1:57 PM 

## 2015-01-13 NOTE — Progress Notes (Signed)
Patient ID: Mason Jones, male   DOB: 06/20/1985, 29 y.o.   MRN: [id number]  DAR: Pt. Denies SI/HI and A/V Hallucinations. He reports that he slept good last night, appetite is fair, energy level is normal, and concentration is good. He rates his depression and hopelessness at 4/10, and anxiety 2/10.  Patient does not report any pain or discomfort at this time. Support and encouragement provided to the patient. Scheduled medications administered to patient per physician's orders. Patient is receptive and cooperative. He is seen in the milieu and is attending groups. Patient is appropriate in interaction. He reports that he does not feel ready to leave today due to medication adjustment and treatment team is aware. Q15 minute checks are maintained for safety.

## 2015-01-13 NOTE — Tx Team (Signed)
Interdisciplinary Treatment Plan Update (Adult)  Date:  01/13/2015  Time Reviewed:  8:39 AM   Progress in Treatment: Attending groups: Yes. Participating in groups:  Yes. Taking medication as prescribed:  Yes. Tolerating medication:  Yes. Family/Significant othe contact made:  SPE completed with pt, as he refused to consent to family contact.  Patient understands diagnosis:  Yes. and As evidenced by:  seeking treatment for depression, SI, and med stabilization Discussing patient identified problems/goals with staff:  Yes. Medical problems stabilized or resolved:  Yes. Denies suicidal/homicidal ideation: Yes. Issues/concerns per patient self-inventory:  Other:  New problem(s) identified:    Discharge Plan or Barriers: Pt plans to return to [facility] after medication stabilization. He plans to follow-up at [facility] for med management.   Reason for Continuation of Hospitalization: Depression Medication stabilization  Comments:  Mason Jones is an 29 y.o. male, single, Caucasian who presents unaccompanied to [place] ED reporting severe depressive symptoms including suicidal ideation. Pt states he has a history of substance abuse, primarily opiates and heroin, and he is currently in residential treatment at [facility] and his psychiatric medications are managed through [facility]. Pt reports six weeks ago he asked the psychiatrist at [facility] if he could discontinue medications and he is currently being tapered off Celexa and Gabapentin. Pt reports recently he has felt very depressed with recurring suicidal thoughts of cutting his wrists. Pt reports a history of three previous suicide attempts including overdose, cutting his wrist and putting a loaded gun in his mouth. Pt report symptoms including spontaneous crying, social withdrawal, anger outbursts, increased sleep, decreased appetite and feelings of hopelessness. He denies homicidal ideation or history of assaulting people but  Pt does have a history of becoming angry and hitting walls and doors. Pt denies access to firearms or other weapons. Pt denies current auditory or visual hallucinations but does have a history in the past of having hallucinations which may have been substance induced.Pt reports a history of substance abuse starting at age 24. He reports he was abusing opiates and heroin but has a history of abusing multiple substances in the past. He reports he has currently been clean for 90 days.   Estimated length of stay:  3-5 days   New goal(s): develop aftercare plan.   Additional Comments:  Patient and CSW reviewed pt's identified goals and treatment plan. Patient verbalized understanding and agreed to treatment plan. CSW reviewed [facility] "Discharge Process and Patient Involvement" Form. Pt verbalized understanding of information provided and signed form.    Review of initial/current patient goals per problem list:  1. Goal(s): Patient will participate in aftercare plan  Met: Yes  Target date: at discharge  As evidenced by: Patient will participate within aftercare plan AEB aftercare provider and housing plan at discharge being identified.  10/17: Return to [facility] for outpatient med management.   2. Goal (s): Patient will exhibit decreased depressive symptoms and suicidal ideations.  Met: No   Target date: at discharge  As evidenced by: Patient will utilize self rating of depression at 3 or below and demonstrate decreased signs of depression or be deemed stable for discharge by MD.  10/17: Pt rates depression as 4 with depressed mood/flat affect. Pt reports some improvement in mood since admission. No SI/HI/AVH.   Attendees: Patient:   01/13/2015 8:39 AM   Family:   01/13/2015 8:39 AM   Physician:  Dr. Carlton Adam, MD 01/13/2015 8:39 AM   Nursing:   Lanell Matar RN 01/13/2015  8:39 AM   Clinical Social Worker: [place], [place]  01/13/2015 8:39 AM    Clinical Social Worker: Erasmo Downer Drinkard LCSWA; Peri Maris LCSWA 01/13/2015 8:39 AM   Other:  Gerline Legacy Nurse Case Manager 01/13/2015 8:39 AM   Other:  Lucinda Dell; [facility]  01/13/2015 8:39 AM   Other:   01/13/2015 8:39 AM   Other:  01/13/2015 8:39 AM   Other:  01/13/2015 8:39 AM   Other:  01/13/2015 8:39 AM    01/13/2015 8:39 AM    01/13/2015 8:39 AM    01/13/2015 8:39 AM    01/13/2015 8:39 AM    Scribe for Treatment Team:   Maxie Better, [place]  01/13/2015 8:39 AM

## 2015-01-13 NOTE — Progress Notes (Signed)
D: Patient alert and oriented x 4. Patient denies pain/SI/HI/AVH. Patient was seen in dayroom interacting with other patients.  A: Staff to monitor Q 15 mins for safety. Encouragement and support offered. Scheduled medications administered per orders. R: Patient remains safe on the unit. Patient attended group tonight. Patient visible on hte unit and interacting with peers. Patient taking administered medications.

## 2015-01-13 NOTE — Progress Notes (Signed)
Adult Psychoeducational Group Note  Date:  01/13/2015 Time:  8:35 PM  Group Topic/Focus:  Wrap-Up Group:   The focus of this group is to help patients review their daily goal of treatment and discuss progress on daily workbooks.  Participation Level:  Active  Participation Quality:  Appropriate  Affect:  Appropriate  Cognitive:  Appropriate  Insight: Appropriate  Engagement in Group:  Engaged  Modes of Intervention:  Discussion  Additional Comments:  Pt rated overal day a 10 out of 10 because "it was another day clean, another day sober". Pt noted that the highlight of his day was "God woke me up". Pt reported that he did not have a goal for the day.   Mason NeerJasmine S Traeger Jones 01/13/2015, 10:51 PM

## 2015-01-13 NOTE — [facility] Group Notes (Signed)
[facility]   01/13/2015 11:13 AM  Participation Quality:  Appropriate   Mood/Affect:  Appropriate  Depression Rating:  4  Anxiety Rating:  3  Thoughts of Suicide:  No Will you contract for safety?   NA  Current AVH:  No  Plan for Discharge/Comments:  [facility]. Started tapering off psych meds, felt depressed and [facility]. Pt plans to call his counselor today to make sure he can [facility]. [facility] for med management.   Transportation Means: unknown at this time. Possibly cab voucher.   Supports: some supports from staff at [facility]   Smart, American FinancialHeather LCSWA

## 2015-01-13 NOTE — Progress Notes (Signed)
[facility] MD Progress Note  01/13/2015 3:22 PM Mason Jones  MRN:  [id number] Subjective:  Mason Jones states that he got upset when he started feeling that depressed and with suicidal ideas when things have been going so well. States he is enjoying his sobriety. Plans to go to [place] when he gets out of [facility]. States in [employer] he will be able to resume his education. Does not want the depression to get in the way Principal Problem: MDD (major depressive disorder), recurrent episode, severe ([place]) Diagnosis:   Patient Active Problem List   Diagnosis Date Noted  . Opiate dependence ([place]) [F11.20] 11/14/2013    Priority: High  . MDD (major depressive disorder), recurrent episode, severe ([place]) [F33.2] 01/12/2015  . Benzodiazepine abuse [F13.10] 11/14/2013  . Severe recurrent major depression without psychotic features ([place]) [F33.2] 11/13/2013  . Polysubstance dependence ([place]) [F19.20] 11/02/2013  . PTSD (post-traumatic stress disorder) [F43.10] 10/08/2013  . Opioid use with withdrawal ([place]) [F11.93] 10/08/2013  . GAD (generalized anxiety disorder) [F41.1] 10/08/2013  . Major depression, recurrent ([place]) [F33.9] 10/08/2013  . Opioid dependence ([place]) [F11.20] 10/03/2013   Total Time spent with patient: 30 minutes  Past Psychiatric History: see admission H and P  Past Medical History:  Past Medical History  Diagnosis Date  . Drug abuse   . Anxiety   . Depression   . PTSD (post-traumatic stress disorder)    History reviewed. No pertinent past surgical history. Family History: History reviewed. No pertinent family history. Family Psychiatric  History: see admission H and P Social History:  History  Alcohol Use  . Yes    Comment: 1-2 times per week     History  Drug Use  . Yes  . Special: Marijuana, Benzodiazepines, Heroin, Oxycodone, Hydrocodone    Comment: opiates, heroine    Social History   Social History  . Marital Status: Single    Spouse Name: N/A  . Number of  Children: N/A  . Years of Education: N/A   Social History Main Topics  . Smoking status: Current Every Day Smoker -- 1.00 packs/day    Types: Cigarettes  . Smokeless tobacco: None  . Alcohol Use: Yes     Comment: 1-2 times per week  . Drug Use: Yes    Special: Marijuana, Benzodiazepines, Heroin, Oxycodone, Hydrocodone     Comment: opiates, heroine  . Sexual Activity: No   Other Topics Concern  . None   Social History Narrative   Additional Social History:    Pain Medications: Pt has history of abusing narcotics Prescriptions: Denies abuse Over the Counter: Denies abuse History of alcohol / drug use?: Yes Longest period of sobriety (when/how long): 90 days currently Negative Consequences of Use: Museum/gallery curator, Scientist, research (physical sciences), Personal relationships, Work / School Name of Substance 1: Heroin 1 - Age of First Use: 16 1 - Amount (size/oz): Varied 1 - Frequency: daily 1 - Duration: ongoing for years 1 - Last Use / Amount: 90 days ago                  Sleep: Fair  Appetite:  Fair  Current Medications: Current Facility-Administered Medications  Medication Dose Route Frequency Provider Last Rate Last Dose  . acetaminophen (TYLENOL) tablet 650 mg  650 mg Oral Q4H PRN Lurena Nida, NP      . citalopram (CELEXA) tablet 20 mg  20 mg Oral Daily Nicholaus Bloom, MD   20 mg at 01/13/15 7846  . gabapentin (NEURONTIN) capsule 200 mg  200  mg Oral BID Nicholaus Bloom, MD   200 mg at 01/13/15 8592  . hydrOXYzine (ATARAX/VISTARIL) tablet 25 mg  25 mg Oral Q6H PRN Lurena Nida, NP   25 mg at 01/12/15 2256  . nicotine polacrilex (NICORETTE) gum 2 mg  2 mg Oral PRN Nicholaus Bloom, MD   2 mg at 01/13/15 1255    Lab Results:  Results for orders placed or performed during the hospital encounter of 01/11/15 (from the past 48 hour(s))  Comprehensive metabolic panel     Status: Abnormal   Collection Time: 01/11/15 11:33 PM  Result Value Ref Range   Sodium 138 135 - 145 mmol/L   Potassium 3.7 3.5 -  5.1 mmol/L   Chloride 105 101 - 111 mmol/L   CO2 27 22 - 32 mmol/L   Glucose, Bld 96 65 - 99 mg/dL   BUN 14 6 - 20 mg/dL   Creatinine, Ser 0.91 0.61 - 1.24 mg/dL   Calcium 9.2 8.9 - 10.3 mg/dL   Total Protein 7.7 6.5 - 8.1 g/dL   Albumin 4.8 3.5 - 5.0 g/dL   AST 25 15 - 41 U/L   ALT 16 (L) 17 - 63 U/L   Alkaline Phosphatase 66 38 - 126 U/L   Total Bilirubin 0.5 0.3 - 1.2 mg/dL   GFR calc non Af Amer >60 >60 mL/min   GFR calc Af Amer >60 >60 mL/min    Comment: (NOTE) The eGFR has been calculated using the CKD EPI equation. This calculation has not been validated in all clinical situations. eGFR's persistently <60 mL/min signify possible Chronic Kidney Disease.    Anion gap 6 5 - 15  Ethanol (ETOH)     Status: None   Collection Time: 01/11/15 11:33 PM  Result Value Ref Range   Alcohol, Ethyl (B) <5 <5 mg/dL    Comment:        LOWEST DETECTABLE LIMIT FOR SERUM ALCOHOL IS 5 mg/dL FOR MEDICAL PURPOSES ONLY   Salicylate level     Status: None   Collection Time: 01/11/15 11:33 PM  Result Value Ref Range   Salicylate Lvl <9.2 2.8 - 30.0 mg/dL  Acetaminophen level     Status: Abnormal   Collection Time: 01/11/15 11:33 PM  Result Value Ref Range   Acetaminophen (Tylenol), Serum <10 (L) 10 - 30 ug/mL    Comment:        THERAPEUTIC CONCENTRATIONS VARY SIGNIFICANTLY. A RANGE OF 10-30 ug/mL MAY BE AN EFFECTIVE CONCENTRATION FOR MANY PATIENTS. HOWEVER, SOME ARE BEST TREATED AT CONCENTRATIONS OUTSIDE THIS RANGE. ACETAMINOPHEN CONCENTRATIONS >150 ug/mL AT 4 HOURS AFTER INGESTION AND >50 ug/mL AT 12 HOURS AFTER INGESTION ARE OFTEN ASSOCIATED WITH TOXIC REACTIONS.   CBC     Status: None   Collection Time: 01/11/15 11:33 PM  Result Value Ref Range   WBC 6.8 4.0 - 10.5 K/uL   RBC 4.73 4.22 - 5.81 MIL/uL   Hemoglobin 15.4 13.0 - 17.0 g/dL   HCT 43.7 39.0 - 52.0 %   MCV 92.4 78.0 - 100.0 fL   MCH 32.6 26.0 - 34.0 pg   MCHC 35.2 30.0 - 36.0 g/dL   RDW 12.2 11.5 - 15.5 %    Platelets 247 150 - 400 K/uL  Urine rapid drug screen (hosp performed) (Not at [facility])     Status: None   Collection Time: 01/12/15 12:53 AM  Result Value Ref Range   Opiates NONE DETECTED NONE DETECTED   Cocaine NONE DETECTED NONE  DETECTED   Benzodiazepines NONE DETECTED NONE DETECTED   Amphetamines NONE DETECTED NONE DETECTED   Tetrahydrocannabinol NONE DETECTED NONE DETECTED   Barbiturates NONE DETECTED NONE DETECTED    Comment:        DRUG SCREEN FOR MEDICAL PURPOSES ONLY.  IF CONFIRMATION IS NEEDED FOR ANY PURPOSE, NOTIFY LAB WITHIN 5 DAYS.        LOWEST DETECTABLE LIMITS FOR URINE DRUG SCREEN Drug Class       Cutoff (ng/mL) Amphetamine      1000 Barbiturate      200 Benzodiazepine   697 Tricyclics       948 Opiates          300 Cocaine          300 THC              50     Physical Findings: AIMS: Facial and Oral Movements Muscles of Facial Expression: None, normal Lips and Perioral Area: None, normal Jaw: None, normal Tongue: None, normal,Extremity Movements Upper (arms, wrists, hands, fingers): None, normal Lower (legs, knees, ankles, toes): None, normal, Trunk Movements Neck, shoulders, hips: None, normal, Overall Severity Severity of abnormal movements (highest score from questions above): None, normal Incapacitation due to abnormal movements: None, normal Patient's awareness of abnormal movements (rate only patient's report): No Awareness, Dental Status Current problems with teeth and/or dentures?: No Does patient usually wear dentures?: No  CIWA:    COWS:     Musculoskeletal: Strength & Muscle Tone: within normal limits Gait & Station: normal Patient leans: normal  Psychiatric Specialty Exam: Review of Systems  Constitutional: Negative.   HENT: Negative.   Eyes: Negative.   Respiratory: Negative.   Cardiovascular: Negative.   Gastrointestinal: Negative.   Genitourinary: Negative.   Musculoskeletal: Negative.   Skin: Negative.   Neurological:  Negative.   Endo/Heme/Allergies: Negative.   Psychiatric/Behavioral: Positive for depression. The patient is nervous/anxious.     Blood pressure 124/56, pulse 45, temperature 97.6 F (36.4 C), temperature source Oral, resp. rate 18, height _0  (1.702 m), weight 68.493 kg (151 lb).Body mass index is 23.64 kg/(m^2).  General Appearance: Fairly Groomed  Engineer, water::  Fair  Speech:  Clear and Coherent  Volume:  Decreased  Mood:  Anxious and worried  Affect:  Appropriate  Thought Process:  Coherent and Goal Directed  Orientation:  Full (Time, Place, and Person)  Thought Content:  symptoms events worries concerns  Suicidal Thoughts:  No  Homicidal Thoughts:  No  Memory:  Immediate;   Fair Recent;   Fair Remote;   Fair  Judgement:  Fair  Insight:  Present  Psychomotor Activity:  Normal  Concentration:  Fair  Recall:  [employer] [place]  Language: Fair  Akathisia:  No  Handed:  Right  AIMS (if indicated):     Assets:  Desire for Improvement  ADL's:  Intact  Cognition: WNL  Sleep:  Number of Hours: 5.25   Treatment Plan Summary: Daily contact with patient to assess and evaluate symptoms and progress in treatment and Medication management Supportive approach/coping skills Polysubstance Dependence; continue to work a relapse prevention plan Depression; continue the Celexa at 20 mg and optimize response Anxiety/agitation; continue the Neurontin 200 mg BID Work with CBT/mindfulness Facilitate getting back to [employer] A 01/13/2015, 3:22 PM

## 2015-01-13 NOTE — [facility] Group Notes (Signed)
[facility] LCSW Group Therapy  01/13/2015 11:58 AM  Type of Therapy:  Group Therapy  Participation Level:  Did Not Attend-pt invited. Chose to remain in bed.    Modes of Intervention:  Confrontation, Discussion, Education, Exploration, Problem-solving, Rapport Building, Socialization and Support  Summary of Progress/Problems: Today's Topic: Overcoming Obstacles. Patients identified one short term goal and potential obstacles in reaching this goal. Patients processed barriers involved in overcoming these obstacles. Patients identified steps necessary for overcoming these obstacles and explored motivation (internal and external) for facing these difficulties head on.   Smart, Vernella Niznik LCSWA  01/13/2015, 11:58 AM

## 2015-01-14 DIAGNOSIS — F332 Major depressive disorder, recurrent severe without psychotic features: Secondary | ICD-10-CM | POA: Insufficient documentation

## 2015-01-14 NOTE — [facility] Group Notes (Signed)
[facility] LCSW Group Therapy  01/14/2015 1:09 PM  Type of Therapy:  Group Therapy  Participation Level:  Active  Participation Quality:  Attentive  Affect:  Appropriate  Cognitive:  Alert  Insight:  Engaged  Engagement in Therapy:  Engaged  Modes of Intervention:  Discussion, Education, Exploration, Problem-solving, Rapport Building, Socialization and Support  Summary of Progress/Problems: [name] came to talk about his personal journey with substance abuse and addiction. The pt processed ways by which to relate to the speaker. [name] provided handouts and educational information pertaining to groups and services offered by the [facility].   Smart, Mason Jones LCSWA 01/14/2015, 1:09 PM

## 2015-01-14 NOTE — Plan of Care (Signed)
Problem: Diagnosis: Increased Risk For Suicide Attempt Goal: STG-Patient Will Report Suicidal Feelings to Staff Outcome: Progressing Pt denies SI, intent or plan.

## 2015-01-14 NOTE — Progress Notes (Signed)
Recreation Therapy Notes  Animal-Assisted Activity (AAA) Program Checklist/Progress Notes Patient Eligibility Criteria Checklist & Daily Group note for Rec Tx Intervention  Date: 10.18.2016  Time: 2:45pm [facility] [address] [facility]    AAA/T Program Assumption of Risk Form signed by Patient/ or Parent Legal Guardian yes  Patient is free of allergies or sever asthma yes  Patient reports no fear of animals yes  Patient reports no history of cruelty to animals no  Patient understands his/her participation is voluntary yes  Behavioral Response: Did not attend.   Mason Jones, LRT/CTRS  Mason Jones L 01/14/2015 3:06 PM

## 2015-01-14 NOTE — [facility] Group Notes (Signed)
Pt stated his day was good and he was content.  He had a visit and had another day clean and sober.  Pt didn't have a goal.   Mason RancherMarjette Yancarlos Jones, MHT

## 2015-01-14 NOTE — Clinical Social Work Note (Signed)
CSW met with pt. Called [place] at [place] [facility] in attempt to discuss transportation back to facility tomorrow afternoon.   Maxie Better, LCSWA Clinical Social Worker 01/14/2015 3:59 PM
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
## 2015-01-14 NOTE — Progress Notes (Signed)
Patient ID: Messiah Ahr, male   DOB: Aug 26, 1985, 29 y.o.   MRN: [id number] D: Patient alert and cooperative. Reports he is 102 days sober. Pt reports he will [facility]. Pt visible on unit and interacting well with peers. Pt reports he is tolerating medication well. Pt denies SI/HI/AVH and pain. No physical distress noted.   A: Met with pt 1:1. Medications administered as prescribed. Support and encouragement provided.   R: Patient remains safe and complaint with medications.

## 2015-01-14 NOTE — Progress Notes (Signed)
[facility] MD Progress Note  01/14/2015 3:48 PM Mason Jones  MRN:  [id number] Subjective:  Mason Jones is back on the full dose of Celexa. He knows that is going to take some time before he gets a full response from the increase. He is looking at going back to [facility] and from there get to [facility]. He states he is committed to make this work.  Principal Problem: MDD (major depressive disorder), recurrent episode, severe (HCC) Diagnosis:   Patient Active Problem List   Diagnosis Date Noted  . Opiate dependence (HCC) [F11.20] 11/14/2013    Priority: High  . MDD (major depressive disorder), recurrent episode, severe (HCC) [F33.2] 01/12/2015  . Benzodiazepine abuse [F13.10] 11/14/2013  . Severe recurrent major depression without psychotic features (HCC) [F33.2] 11/13/2013  . Polysubstance dependence (HCC) [F19.20] 11/02/2013  . PTSD (post-traumatic stress disorder) [F43.10] 10/08/2013  . Opioid use with withdrawal (HCC) [F11.93] 10/08/2013  . GAD (generalized anxiety disorder) [F41.1] 10/08/2013  . Major depression, recurrent (HCC) [F33.9] 10/08/2013  . Opioid dependence (HCC) [F11.20] 10/03/2013   Total Time spent with patient: 30 minutes  Past Psychiatric History: See Admission H and P  Past Medical History:  Past Medical History  Diagnosis Date  . Drug abuse   . Anxiety   . Depression   . PTSD (post-traumatic stress disorder)    History reviewed. No pertinent past surgical history. Family History: History reviewed. No pertinent family history. Family Psychiatric  History: see Admission H and P Social History:  History  Alcohol Use  . Yes    Comment: 1-2 times per week     History  Drug Use  . Yes  . Special: Marijuana, Benzodiazepines, Heroin, Oxycodone, Hydrocodone    Comment: opiates, heroine    Social History   Social History  . Marital Status: Single    Spouse Name: N/A  . Number of Children: N/A  . Years of Education: N/A   Social History Main Topics  . Smoking  status: Current Every Day Smoker -- 1.00 packs/day    Types: Cigarettes  . Smokeless tobacco: None  . Alcohol Use: Yes     Comment: 1-2 times per week  . Drug Use: Yes    Special: Marijuana, Benzodiazepines, Heroin, Oxycodone, Hydrocodone     Comment: opiates, heroine  . Sexual Activity: No   Other Topics Concern  . None   Social History Narrative   Additional Social History:    Pain Medications: Pt has history of abusing narcotics Prescriptions: Denies abuse Over the Counter: Denies abuse History of alcohol / drug use?: Yes Longest period of sobriety (when/how long): 90 days currently Negative Consequences of Use: Surveyor, quantity, Armed forces operational officer, Personal relationships, Work / School Name of Substance 1: Heroin 1 - Age of First Use: 16 1 - Amount (size/oz): Varied 1 - Frequency: daily 1 - Duration: ongoing for years 1 - Last Use / Amount: 90 days ago                  Sleep: Fair  Appetite:  Fair  Current Medications: Current Facility-Administered Medications  Medication Dose Route Frequency Provider Last Rate Last Dose  . acetaminophen (TYLENOL) tablet 650 mg  650 mg Oral Q4H PRN Kristeen Mans, NP      . citalopram (CELEXA) tablet 20 mg  20 mg Oral Daily Rachael Fee, MD   20 mg at 01/14/15 0848  . feeding supplement (ENSURE ENLIVE) (ENSURE ENLIVE) liquid 237 mL  237 mL Oral TID BM Rachael Fee,  MD   237 mL at 01/14/15 1000  . gabapentin (NEURONTIN) capsule 200 mg  200 mg Oral BID Rachael FeeIrving A Joziah Dollins, MD   200 mg at 01/14/15 0849  . hydrOXYzine (ATARAX/VISTARIL) tablet 25 mg  25 mg Oral Q6H PRN Kristeen MansFran E Hobson, NP   25 mg at 01/12/15 2256  . nicotine polacrilex (NICORETTE) gum 2 mg  2 mg Oral PRN Rachael FeeIrving A Kaylia Winborne, MD   2 mg at 01/13/15 2010    Lab Results: No results found for this or any previous visit (from the past 48 hour(s)).  Physical Findings: AIMS: Facial and Oral Movements Muscles of Facial Expression: None, normal Lips and Perioral Area: None, normal Jaw: None,  normal Tongue: None, normal,Extremity Movements Upper (arms, wrists, hands, fingers): None, normal Lower (legs, knees, ankles, toes): None, normal, Trunk Movements Neck, shoulders, hips: None, normal, Overall Severity Severity of abnormal movements (highest score from questions above): None, normal Incapacitation due to abnormal movements: None, normal Patient's awareness of abnormal movements (rate only patient's report): No Awareness, Dental Status Current problems with teeth and/or dentures?: No Does patient usually wear dentures?: No  CIWA:    COWS:     Musculoskeletal: Strength & Muscle Tone: within normal limits Gait & Station: normal Patient leans: normal  Psychiatric Specialty Exam: Review of Systems  Constitutional: Negative.   HENT: Negative.   Eyes: Negative.   Respiratory: Negative.   Cardiovascular: Negative.   Gastrointestinal: Negative.   Genitourinary: Negative.   Musculoskeletal: Negative.   Skin: Negative.   Neurological: Negative.   Endo/Heme/Allergies: Negative.   Psychiatric/Behavioral: Positive for depression and substance abuse. The patient is nervous/anxious.     Blood pressure 124/56, pulse 45, temperature 97.6 F (36.4 C), temperature source Oral, resp. rate 18, height 5\' 7"  (1.702 m), weight 68.493 kg (151 lb).Body mass index is 23.64 kg/(m^2).  General Appearance: Fairly Groomed  Patent attorneyye Contact::  Fair  Speech:  Clear and Coherent  Volume:  Normal  Mood:  better  Affect:  Appropriate  Thought Process:  Coherent and Goal Directed  Orientation:  Full (Time, Place, and Person)  Thought Content:  symptoms events worries concerns  Suicidal Thoughts:  No  Homicidal Thoughts:  No  Memory:  Immediate;   Fair Recent;   Fair Remote;   Fair  Judgement:  Fair  Insight:  Present  Psychomotor Activity:  Decreased  Concentration:  Fair  Recall:  FiservFair  Fund of Knowledge:Fair  Language: Fair  Akathisia:  No  Handed:  Right  AIMS (if indicated):      Assets:  Desire for Improvement  ADL's:  Intact  Cognition: WNL  Sleep:  Number of Hours: 6.25   Treatment Plan Summary: Daily contact with patient to assess and evaluate symptoms and progress in treatment and Medication management Supportive approach/coping skills Polysubstance Dependence/continue to work a relapse prevention plan Depression; continue to work with the Celexa 20 mg and reassess Mood instability/Agitation; work with Neurontin 200 mg BID Use DBT/mindfulness  Rocsi Hazelbaker A 01/14/2015, 3:48 PM

## 2015-01-15 MED ORDER — GABAPENTIN 100 MG PO CAPS: 200.0000 mg | ORAL_CAPSULE | Freq: Two times a day (BID) | ORAL | Status: DC

## 2015-01-15 MED ORDER — CITALOPRAM HYDROBROMIDE 20 MG PO TABS
20.0000 mg | ORAL_TABLET | Freq: Every day | ORAL | Status: DC
Start: 2015-01-15 — End: 2018-09-16

## 2015-01-15 MED ORDER — NICOTINE POLACRILEX 2 MG MT GUM: 2.0000 mg | CHEWING_GUM | OROMUCOSAL | Status: DC | PRN
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
## 2015-01-15 NOTE — Progress Notes (Signed)
Patient ID: Mason Jones, male   DOB: 1985/06/11, 29 y.o.   MRN: [id number] D: Patient alert and cooperative with girlfriend visiting. Reports he is 103 days sober. Ready to [facility].    Pt visible on unit and interacting well with peers and staff. Pt reports he is tolerating medication well. Pt denies SI/HI/AVH and pain. No physical distress noted.   A: Met with pt 1:1. Medications administered as prescribed. Support and encouragement provided.   R: Patient remains safe and complaint with medications.

## 2015-01-15 NOTE — Plan of Care (Signed)
Problem: Alteration in mood & ability to function due to Goal: STG-Patient will attend groups Outcome: Progressing Pt attended evening wrap up group     

## 2015-01-15 NOTE — Progress Notes (Signed)
  [facility] :  Will you be returning to the same living situation after discharge:  Yes,  plans to return to [facility] At discharge, do you have transportation home?: Yes,  [facility] will pick [facility] at d/c.  Do you have the ability to pay for your medications: Yes,  mental health  Release of information consent forms completed and submitted to medical records by CSW.  Patient to Follow up at: Follow-up Information    Follow up with [facility].   Why:  Walk-In Clinic is open Monday-Friday 8am-3pm for medication management and therapy. Your first visit will take a long time, so be prepared to stay for 4-5 hours.   Contact [address]io Eric:   [address] [place] [address] Phone: [phone] Fax: [phone]      [facility]   Why:  Return to facility on this date to continue inpatient substance abuse treatment. They will transport you back to facility at discharge.    Contact information:   [address] [place], [place] [address] Phone: [phone] Fax: [phone]      Patient denies SI/HI: Yes,  during group/self report.     Safety Planning and Suicide Prevention discussed: Yes,  SPE completed with pt, as he refused to consent to family contact. SPI pamphlet provided to pt and he was encouraged to share information with support network, ask questions, and talk about any concerns relating to SPE.  Have you used any form of tobacco in the last 30 days? (Cigarettes, Smokeless Tobacco, Cigars, and/or Pipes): Yes  Has patient been referred to the Quitline?: Patient refused referral  Smart, Lebron QuamHeather LCSWA  01/15/2015, 12:47 PM

## 2015-01-15 NOTE — Discharge Summary (Signed)
Physician Discharge Summary Note  Patient:  Mason Jones is an 29 y.o., male MRN:  [id number] DOB:  09/24/1985 Patient phone:  [phone] (home)  Patient [place]   [facility] [place] [id number],  Total Time spent with patient: 30 minutes  Date of Admission:  01/12/2015 Date of Discharge: 01/14/2015  Reason for Admission:  Depression  Principal Problem: MDD (major depressive disorder), recurrent episode, severe [facility](HCC) Discharge Diagnoses: Patient Active Problem List   Diagnosis Date Noted  . Severe episode of recurrent major depressive disorder, without psychotic features (HCC) [F33.2]   . MDD (major depressive disorder), recurrent episode, severe (HCC) [F33.2] 01/12/2015  . Opiate dependence (HCC) [F11.20] 11/14/2013  . Benzodiazepine abuse [F13.10] 11/14/2013  . Severe recurrent major depression without psychotic features (HCC) [F33.2] 11/13/2013  . Polysubstance dependence (HCC) [F19.20] 11/02/2013  . PTSD (post-traumatic stress disorder) [F43.10] 10/08/2013  . Opioid use with withdrawal (HCC) [F11.93] 10/08/2013  . GAD (generalized anxiety disorder) [F41.1] 10/08/2013  . Major depression, recurrent (HCC) [F33.9] 10/08/2013  . Opioid dependence [facility](HCC) [F11.20] 10/03/2013    Musculoskeletal: Strength & Muscle Tone: within normal limits Gait & Station: normal Patient leans: N/A  Psychiatric Specialty Exam:  SEE MD SRA Physical Exam  Vitals reviewed.   Review of Systems  All other systems reviewed and are negative.   Blood pressure 93/46, pulse 67, temperature 97.8 F (36.6 C), temperature source Oral, resp. rate 20, height 5\' 7"  (1.702 m), weight 68.493 kg (151 lb).Body mass index is 23.64 kg/(m^2).  Have you used any form of tobacco in the last 30 days? (Cigarettes, Smokeless Tobacco, Cigars, and/or Pipes): Yes  Has this patient used any form of tobacco in the last 30 days? (Cigarettes, Smokeless Tobacco, Cigars, and/or Pipes) N/A  Past Medical History:  Past Medical  History  Diagnosis Date  . Drug abuse   . Anxiety   . Depression   . PTSD (post-traumatic stress disorder)    History reviewed. No pertinent past surgical history. Family History: History reviewed. No pertinent family history. Social History:  History  Alcohol Use  . Yes    Comment: 1-2 times per week     History  Drug Use  . Yes  . Special: Marijuana, Benzodiazepines, Heroin, Oxycodone, Hydrocodone    Comment: opiates, heroine    Social History   Social History  . Marital Status: Single    Spouse Name: N/A  . Number of Children: N/A  . Years of Education: N/A   Social History Main Topics  . Smoking status: Current Every Day Smoker -- 1.00 packs/day    Types: Cigarettes  . Smokeless tobacco: None  . Alcohol Use: Yes     Comment: 1-2 times per week  . Drug Use: Yes    Special: Marijuana, Benzodiazepines, Heroin, Oxycodone, Hydrocodone     Comment: opiates, heroine  . Sexual Activity: No   Other Topics Concern  . None   Social History Narrative   Risk to Self: Is patient at risk for suicide?: Yes What has been your use of drugs/alcohol within the last 12 months?: history of opiate abuse, reports being clean for 90 days Risk to Others:   Prior Inpatient Therapy:   Prior Outpatient Therapy:    Level of Care:  OP  Hospital Course:  Mason HeirJames Philemon was admitted for MDD (major depressive disorder), recurrent episode, severe (HCC) and crisis management.  He was treated discharged with the medications listed below under Medication List.  Medical problems were identified and treated as needed.  Home medications were restarted as appropriate.  Improvement was monitored by observation and Mason Heir daily report of symptom reduction.  Emotional and mental status was monitored by daily self-inventory reports completed by Mason Heir and clinical staff.         Mason Heir was evaluated by the treatment team for stability and plans for continued recovery upon discharge.  Mason Heir  motivation was an integral factor for scheduling further treatment.  Employment, transportation, bed availability, health status, family support, and any pending legal issues were also considered during her hospital stay.  He was offered further treatment options upon discharge including but not limited to Residential, Intensive Outpatient, and Outpatient treatment.  Devlin Brink will follow up with the services as listed below under Follow Up Information.     Upon completion of this admission the patient was both mentally and medically stable for discharge denying suicidal/homicidal ideation, auditory/visual/tactile hallucinations, delusional thoughts and paranoia.      Consults:  psychiatry  Significant Diagnostic Studies:  labs: per ED  Discharge Vitals:   Blood pressure 93/46, pulse 67, temperature 97.8 F (36.6 C), temperature source Oral, resp. rate 20, height  (1.702 m), weight 68.493 kg (151 lb). Body mass index is 23.64 kg/(m^2). Lab Results:   No results found for this or any previous visit (from the past 72 hour(s)).  Physical Findings: AIMS: Facial and Oral Movements Muscles of Facial Expression: None, normal Lips and Perioral Area: None, normal Jaw: None, normal Tongue: None, normal,Extremity Movements Upper (arms, wrists, hands, fingers): None, normal Lower (legs, knees, ankles, toes): None, normal, Trunk Movements Neck, shoulders, hips: None, normal, Overall Severity Severity of abnormal movements (highest score from questions above): None, normal Incapacitation due to abnormal movements: None, normal Patient's awareness of abnormal movements (rate only patient's report): No Awareness, Dental Status Current problems with teeth and/or dentures?: No Does patient usually wear dentures?: No  CIWA:    COWS:      See Psychiatric Specialty Exam and Suicide Risk Assessment completed by Attending Physician prior to discharge.  Discharge destination:  Home  Is patient on  multiple antipsychotic therapies at discharge:  No   Has Patient had three or more failed trials of antipsychotic monotherapy by history:  No    Recommended Plan for Multiple Antipsychotic Therapies: NA     Medication List    TAKE these medications      Indication   citalopram 20 MG tablet  Commonly known as:  CELEXA  Take 1 tablet (20 mg total) by mouth daily.   Indication:  Depression     gabapentin 100 MG capsule  Commonly known as:  NEURONTIN  Take 2 capsules (200 mg total) by mouth 2 (two) times daily.      nicotine polacrilex 2 MG gum  Commonly known as:  NICORETTE  Take 1 each (2 mg total) by mouth as needed for smoking cessation.   Indication:  Nicotine Addiction       Follow-up Information    Follow up with [facility].   Why:  Walk-In Clinic is open Monday-Friday 8am-3pm for medication management and therapy. Your first visit will take a long time, so be prepared to stay for 4-5 hours.   [address] [place] [address] Phone: [phone] Fax: [phone]      Follow up with [facility] On 01/15/2015.   Why:  Return to facility on this date to continue inpatient substance abuse treatment.    Contact information:  [address] [place], [place] [address] Phone: [phone] Fax: [phone]      Follow-up recommendations:  Activity:  as tol Diet:  as tol  Comments:  Mason Heir was admitted for MDD (major depressive disorder), recurrent episode, severe (HCC) and crisis management.  He was treated discharged with the medications listed below under Medication List.  Medical problems were identified and treated as needed.  Home medications were restarted as appropriate.  Improvement was monitored by observation and Mason Heir daily report of symptom reduction.  Emotional and mental status was monitored by daily self-inventory reports completed by Mason Heir and clinical staff.         Mason Heir was evaluated by the  treatment team for stability and plans for continued recovery upon discharge.  Mason Heir motivation was an integral factor for scheduling further treatment.  Employment, transportation, bed availability, health status, family support, and any pending legal issues were also considered during his hospital stay.  He was offered further treatment options upon discharge including but not limited to Residential, Intensive Outpatient, and Outpatient treatment.  Mccabe Gloria will follow up with the services as listed below under Follow Up Information.     Upon completion of this admission the patient was both mentally and medically stable for discharge denying suicidal/homicidal ideation, auditory/visual/tactile hallucinations, delusional thoughts and paranoia.      Total Discharge Time:  30 min  Signed: Velna Hatchet May Agustin AGNP-BC 01/15/2015, 10:32 AM  I personally assessed the patient and formulated the plan Madie Reno A. Dub Mikes, M.D.
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
## 2015-01-15 NOTE — Progress Notes (Signed)
Pt discharged home with a driver from [place].Pt was ambulatory and well.  All papers and prescriptions were given and valuables returned. [place]  understanding expressed. Denies SI/HI and A/VH. Pt given opportunity to express concerns and asked questions.

## 2016-08-15 ENCOUNTER — Emergency Department (HOSPITAL_COMMUNITY)
Admission: EM | Admit: 2016-08-15 | Discharge: 2016-08-15 | Disposition: A | Discharge status: Home / Self Care | Payer: Worker's Compensation | Attending: Emergency Medicine | Admitting: Emergency Medicine

## 2016-08-15 ENCOUNTER — Encounter (HOSPITAL_COMMUNITY): Payer: Self-pay | Admitting: Emergency Medicine

## 2016-08-15 DIAGNOSIS — S51811A Laceration without foreign body of right forearm, initial encounter: Secondary | ICD-10-CM | POA: Diagnosis not present

## 2016-08-15 DIAGNOSIS — Y929 Unspecified place or not applicable: Secondary | ICD-10-CM | POA: Insufficient documentation

## 2016-08-15 DIAGNOSIS — W260XXA Contact with knife, initial encounter: Secondary | ICD-10-CM | POA: Insufficient documentation

## 2016-08-15 DIAGNOSIS — Y990 Civilian activity done for income or pay: Secondary | ICD-10-CM | POA: Insufficient documentation

## 2016-08-15 DIAGNOSIS — Y939 Activity, unspecified: Secondary | ICD-10-CM | POA: Insufficient documentation

## 2016-08-15 DIAGNOSIS — F17210 Nicotine dependence, cigarettes, uncomplicated: Secondary | ICD-10-CM | POA: Diagnosis not present

## 2016-08-15 DIAGNOSIS — Z79899 Other long term (current) drug therapy: Secondary | ICD-10-CM | POA: Insufficient documentation

## 2016-08-15 DIAGNOSIS — Z23 Encounter for immunization: Secondary | ICD-10-CM | POA: Diagnosis not present

## 2016-08-15 MED ORDER — IBUPROFEN 200 MG PO TABS
600.0000 mg | ORAL_TABLET | Freq: Once | ORAL | Status: AC
  Administered 2016-08-15: 600 mg via ORAL
  Filled 2016-08-15: qty 3

## 2016-08-15 MED ORDER — LIDOCAINE-EPINEPHRINE (PF) 2 %-1:200000 IJ SOLN
10.0000 mL | Freq: Once | INTRAMUSCULAR | Status: AC
  Administered 2016-08-15: 10 mL
  Filled 2016-08-15: qty 20

## 2016-08-15 MED ORDER — ACETAMINOPHEN 500 MG PO TABS
1000.0000 mg | ORAL_TABLET | Freq: Once | ORAL | Status: AC
  Administered 2016-08-15: 1000 mg via ORAL
  Filled 2016-08-15: qty 2

## 2016-08-15 MED ORDER — TETANUS-DIPHTH-ACELL PERTUSSIS 5-2.5-18.5 LF-MCG/0.5 IM SUSP
0.5000 mL | Freq: Once | INTRAMUSCULAR | Status: AC
  Administered 2016-08-15: 0.5 mL via INTRAMUSCULAR
  Filled 2016-08-15: qty 0.5

## 2016-08-15 NOTE — ED Notes (Signed)
Provider at bedside for suturing.

## 2016-08-15 NOTE — ED Triage Notes (Addendum)
Pt has laceration to right forearm unintentionally with knife at work. Pt presents with boss. States he slumped down after incident when seeing blood. Bleeding controlled. Patient is requesting no narcotics due to history.

## 2016-08-15 NOTE — Discharge Instructions (Signed)
Your laceration was irrigated and repaired today.   Keep original dressing on for 48 hours. After 48 hours, change your dressing as I showed you today.  Apply a thin layer of antibiotic ointment first.    Get your sutures removed within 8-10 days.   Monitor for signs of infection: increased redness, swelling, yellow or bloody discharge, fevers. Return to the emergency department if you notice these symptoms  You can take 600 mg ibuprofen or 650 mg tylenol for pain as needed (every 8 hours).

## 2016-08-15 NOTE — ED Provider Notes (Signed)
[facility]-EMERGENCY DEPT Provider Note   CSN: [id number] Arrival date & time: 08/15/16  2047     History   Chief Complaint Chief Complaint  Patient presents with  . Extremity Laceration    HPI Mason Jones is a 31 y.o. male presents to the ED with right forearm laceration sustained while at work approximately 1 hour PTA. Patient works in [employer], was moving around some cardboard and [employer] when he accidentally injured his right forearm with a knife. Patient reports slumping down after seeing his own blood, no LOC. Patient reports mild, localized pain to area of laceration. No numbness, tingling to the right hand. Bleeding is controlled. Unknown tetanus status. Patient is a recovering heroin addict, has been sober for approximately one year. He is nervous about seeing needles during laceration repair and request no narcotic pain medications.  HPI  Past Medical History:  Diagnosis Date  . Anxiety   . Depression   . Drug abuse   . PTSD (post-traumatic stress disorder)     Patient Active Problem List   Diagnosis Date Noted  . Severe episode of recurrent major depressive disorder, without psychotic features (HCC)   . MDD (major depressive disorder), recurrent episode, severe (HCC) 01/12/2015  . Opiate dependence (HCC) 11/14/2013  . Benzodiazepine abuse 11/14/2013  . Severe recurrent major depression without psychotic features (HCC) 11/13/2013  . Polysubstance dependence (HCC) 11/02/2013  . PTSD (post-traumatic stress disorder) 10/08/2013  . Opioid use with withdrawal (HCC) 10/08/2013  . GAD (generalized anxiety disorder) 10/08/2013  . Major depression, recurrent (HCC) 10/08/2013  . Opioid dependence (HCC) 10/03/2013    History reviewed. No pertinent surgical history.     Home Medications    Prior to Admission medications   Medication Sig Start Date End Date Taking? Authorizing Provider  citalopram (CELEXA) 20 MG tablet Take 1 tablet (20 mg total) by  mouth daily. 01/15/15   Adonis Brook, NP  gabapentin (NEURONTIN) 100 MG capsule Take 2 capsules (200 mg total) by mouth 2 (two) times daily. 01/15/15   Adonis Brook, NP  nicotine polacrilex (NICORETTE) 2 MG gum Take 1 each (2 mg total) by mouth as needed for smoking cessation. 01/15/15   Adonis Brook, NP    Family History No family history on file.  Social History Social History  Substance Use Topics  . Smoking status: Current Every Day Smoker    Packs/day: 1.00    Types: Cigarettes  . Smokeless tobacco: Not on file  . Alcohol use Yes     Comment: 1-2 times per week     Allergies   Patient has no known allergies.   Review of Systems Review of Systems  Respiratory: Negative for shortness of breath.   Cardiovascular: Negative for chest pain and palpitations.  Gastrointestinal: Negative for nausea.  Musculoskeletal: Positive for myalgias (localized to laceration).  Skin: Positive for wound.  Neurological: Positive for light-headedness. Negative for syncope and numbness.       No hand tingling     Physical Exam Updated Vital Signs BP 128/63   Pulse (!) 57   Resp 16   SpO2 98%   Physical Exam  Constitutional: He is oriented to person, place, and time. He appears well-developed and well-nourished. No distress.  HENT:  Head: Normocephalic and atraumatic.  Eyes: Conjunctivae and EOM are normal. No scleral icterus.  Neck: Normal range of motion.  Cardiovascular: Normal rate, regular rhythm, normal heart sounds and intact distal pulses.   No murmur heard. Pulmonary/Chest:  Effort normal and breath sounds normal. He has no wheezes.  Musculoskeletal: Normal range of motion. He exhibits no deformity.  Full active ROM of elbow, wrist and fingers (right)  Neurological: He is alert and oriented to person, place, and time.  Sensation to light touch in median, ulnar and radial nerve distribution intact Good hand grip bilaterally Good pincer strength  Skin: Skin is warm  and dry. Capillary refill takes less than 2 seconds. Laceration noted.     6 cm straight laceration to volar right forearm, bleeding controlled  Psychiatric: He has a normal mood and affect. His behavior is normal. Judgment and thought content normal.  Nursing note and vitals reviewed.    ED Treatments / Results  Labs (all labs ordered are listed, but only abnormal results are displayed) Labs Reviewed - No data to display  EKG  EKG Interpretation None       Radiology No results found.  Procedures .Marland Kitchen.Laceration Repair Date/Time: 08/15/2016 11:24 PM Performed by: Liberty HandyGIBBONS, CLAUDIA J Authorized by: Liberty HandyGIBBONS, CLAUDIA J   Consent:    Consent obtained:  Verbal   Consent given by:  Patient   Risks discussed:  Infection, pain, poor cosmetic result and poor wound healing   Alternatives discussed:  Observation Anesthesia (see MAR for exact dosages):    Anesthesia method:  Local infiltration   Local anesthetic:  Lidocaine 2% WITH epi Laceration details:    Location:  Shoulder/arm   Shoulder/arm location:  R lower arm   Length (cm):  6 Repair type:    Repair type:  Simple Pre-procedure details:    Preparation:  Patient was prepped and draped in usual sterile fashion Exploration:    Hemostasis achieved with:  Epinephrine and direct pressure   Wound exploration: wound explored through full range of motion and entire depth of wound probed and visualized     Wound extent: no foreign bodies/material noted, no nerve damage noted and no tendon damage noted     Contaminated: no   Treatment:    Area cleansed with:  Saline and Betadine   Amount of cleaning:  Standard   Irrigation solution:  Sterile saline   Irrigation volume:  500 cc   Irrigation method:  Syringe and tap   Visualized foreign bodies/material removed: no   Skin repair:    Repair method:  Sutures   Suture size:  5-0   Suture material:  Prolene   Suture technique:  Simple interrupted   Number of sutures:   8 Approximation:    Approximation:  Close   Vermilion border: well-aligned   Post-procedure details:    Dressing:  Antibiotic ointment, non-adherent dressing, sterile dressing and adhesive bandage   Patient tolerance of procedure:  Tolerated well, no immediate complications   (including critical care time)  Medications Ordered in ED Medications  acetaminophen (TYLENOL) tablet 1,000 mg (1,000 mg Oral Given 08/15/16 2140)  ibuprofen (ADVIL,MOTRIN) tablet 600 mg (600 mg Oral Given 08/15/16 2139)  lidocaine-EPINEPHrine (XYLOCAINE W/EPI) 2 %-1:200000 (PF) injection 10 mL (10 mLs Infiltration Given 08/15/16 2141)  Tdap (BOOSTRIX) injection 0.5 mL (0.5 mLs Intramuscular Given 08/15/16 2140)     Initial Impression / Assessment and Plan / ED Course  I have reviewed the triage vital signs and the nursing notes.  Pertinent labs & imaging results that were available during my care of the patient were reviewed by me and considered in my medical decision making (see chart for details).    Patient is a 31 y.o. yo male that  presents with laceration to right forearm. Tdap booster given. Pressure irrigation performed. Bottom of the wound visualized with bleeding controled, no foreign bodies seen.  No tendon injury. Full ROM of RUE.  RUE is NVI. Laceration occurred < 12 hours prior to repair which was well tolerated. Pt has no co morbidities to effect normal wound healing. Discussed suture home care with pt and answered questions. Pt to follow up for wound check and suture removal in 8-10 days. Pt is hemodynamically stable w no complaints prior to dc.    Final Clinical Impressions(s) / ED Diagnoses   Final diagnoses:  Laceration of right forearm, initial encounter    New Prescriptions Discharge Medication List as of 08/15/2016 10:51 PM       Liberty Handy, PA-C 08/15/16 2326    Tilden Fossa, MD 08/16/16 [phone]

## 2016-11-23 ENCOUNTER — Encounter (HOSPITAL_COMMUNITY): Payer: Self-pay | Admitting: Emergency Medicine

## 2016-11-23 ENCOUNTER — Emergency Department (HOSPITAL_COMMUNITY): Payer: No Typology Code available for payment source

## 2016-11-23 ENCOUNTER — Emergency Department (HOSPITAL_COMMUNITY)
Admission: EM | Admit: 2016-11-23 | Discharge: 2016-11-23 | Disposition: A | Discharge status: Home / Self Care | Payer: No Typology Code available for payment source | Attending: Emergency Medicine | Admitting: Emergency Medicine

## 2016-11-23 DIAGNOSIS — S50811A Abrasion of right forearm, initial encounter: Secondary | ICD-10-CM | POA: Diagnosis not present

## 2016-11-23 DIAGNOSIS — Y999 Unspecified external cause status: Secondary | ICD-10-CM | POA: Diagnosis not present

## 2016-11-23 DIAGNOSIS — S80211A Abrasion, right knee, initial encounter: Secondary | ICD-10-CM | POA: Insufficient documentation

## 2016-11-23 DIAGNOSIS — S50812A Abrasion of left forearm, initial encounter: Secondary | ICD-10-CM | POA: Insufficient documentation

## 2016-11-23 DIAGNOSIS — S60511A Abrasion of right hand, initial encounter: Secondary | ICD-10-CM | POA: Diagnosis not present

## 2016-11-23 DIAGNOSIS — T07XXXA Unspecified multiple injuries, initial encounter: Secondary | ICD-10-CM | POA: Diagnosis not present

## 2016-11-23 DIAGNOSIS — S60512A Abrasion of left hand, initial encounter: Secondary | ICD-10-CM | POA: Insufficient documentation

## 2016-11-23 DIAGNOSIS — F17210 Nicotine dependence, cigarettes, uncomplicated: Secondary | ICD-10-CM | POA: Diagnosis not present

## 2016-11-23 DIAGNOSIS — Y929 Unspecified place or not applicable: Secondary | ICD-10-CM | POA: Diagnosis not present

## 2016-11-23 DIAGNOSIS — Y939 Activity, unspecified: Secondary | ICD-10-CM | POA: Insufficient documentation

## 2016-11-23 DIAGNOSIS — Z79899 Other long term (current) drug therapy: Secondary | ICD-10-CM | POA: Diagnosis not present

## 2016-11-23 DIAGNOSIS — Z041 Encounter for examination and observation following transport accident: Secondary | ICD-10-CM | POA: Diagnosis present

## 2016-11-23 MED ORDER — OXYCODONE-ACETAMINOPHEN 5-325 MG PO TABS
1.0000 | ORAL_TABLET | ORAL | Status: DC | PRN
  Administered 2016-11-23: 1 via ORAL
  Filled 2016-11-23: qty 1

## 2016-11-23 MED ORDER — KETOROLAC TROMETHAMINE 60 MG/2ML IM SOLN
30.0000 mg | Freq: Once | INTRAMUSCULAR | Status: AC
  Administered 2016-11-23: 30 mg via INTRAMUSCULAR
  Filled 2016-11-23: qty 2

## 2016-11-23 MED ORDER — TRAMADOL HCL 50 MG PO TABS: 50.0000 mg | ORAL_TABLET | Freq: Four times a day (QID) | ORAL | 0 refills | Status: DC | PRN

## 2016-11-23 MED ORDER — CYCLOBENZAPRINE HCL 10 MG PO TABS: 10.0000 mg | ORAL_TABLET | Freq: Two times a day (BID) | ORAL | 0 refills | Status: DC | PRN

## 2016-11-23 MED ORDER — OXYCODONE-ACETAMINOPHEN 5-325 MG PO TABS
1.0000 | ORAL_TABLET | Freq: Once | ORAL | Status: AC
  Administered 2016-11-23: 1 via ORAL
  Filled 2016-11-23: qty 1

## 2016-11-23 MED ORDER — DICLOFENAC SODIUM 50 MG PO TBEC: 50.0000 mg | DELAYED_RELEASE_TABLET | Freq: Two times a day (BID) | ORAL | 0 refills | Status: DC

## 2016-11-23 MED ORDER — LIDOCAINE HCL 2 % EX GEL
1.0000 "application " | Freq: Once | CUTANEOUS | Status: AC
  Administered 2016-11-23: 1 via TOPICAL
  Filled 2016-11-23: qty 11

## 2016-11-23 MED ORDER — CEPHALEXIN 500 MG PO CAPS: 500.0000 mg | ORAL_CAPSULE | Freq: Four times a day (QID) | ORAL | 0 refills | Status: DC

## 2016-11-23 MED ORDER — BACITRACIN ZINC 500 UNIT/GM EX OINT: TOPICAL_OINTMENT | Freq: Once | CUTANEOUS | Status: DC
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
## 2016-11-23 NOTE — ED Notes (Signed)
Lidocaine jelly applied to patient's wounds.

## 2016-11-23 NOTE — Discharge Instructions (Signed)
Do not drive while taking the narcotic pain medication or the muscle relaxant because they will make you sleepy. Return as needed for worsening symptoms.

## 2017-02-21 ENCOUNTER — Emergency Department (HOSPITAL_COMMUNITY): Admission: EM | Admit: 2017-02-21 | Discharge: 2017-02-21 | Discharge status: Against Medical Advice | Payer: Self-pay

## 2017-02-21 ENCOUNTER — Other Ambulatory Visit: Payer: Self-pay

## 2017-02-21 NOTE — ED Triage Notes (Signed)
Pt states he wants to leave and will come back tomorrow

## 2018-02-15 ENCOUNTER — Encounter (INDEPENDENT_AMBULATORY_CARE_PROVIDER_SITE_OTHER): Payer: Self-pay | Admitting: Family Medicine

## 2018-02-15 ENCOUNTER — Ambulatory Visit (INDEPENDENT_AMBULATORY_CARE_PROVIDER_SITE_OTHER): Payer: PRIVATE HEALTH INSURANCE | Admitting: Family Medicine

## 2018-02-15 ENCOUNTER — Ambulatory Visit (INDEPENDENT_AMBULATORY_CARE_PROVIDER_SITE_OTHER): Payer: Self-pay

## 2018-02-15 DIAGNOSIS — M545 Low back pain, unspecified: Secondary | ICD-10-CM

## 2018-02-15 DIAGNOSIS — G8929 Other chronic pain: Secondary | ICD-10-CM | POA: Diagnosis not present

## 2018-02-15 NOTE — Patient Instructions (Signed)
    Over-the-counter:  1.  Vitamin D3:  5,000 IU daily 2.  Magnesium:  400 mg daily 3.  Zinc:  20-30 mg daily  Diet:  Minimize intake of processed carbohydrates (breads; pastas; cereals; sugars/sweets, including soft drinks.)
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
## 2018-06-19 ENCOUNTER — Encounter (HOSPITAL_COMMUNITY): Payer: Self-pay | Admitting: *Deleted

## 2018-06-19 ENCOUNTER — Emergency Department (HOSPITAL_COMMUNITY)
Admission: EM | Admit: 2018-06-19 | Discharge: 2018-06-19 | Disposition: A | Discharge status: Home / Self Care | Payer: Self-pay | Attending: Emergency Medicine | Admitting: Emergency Medicine

## 2018-06-19 ENCOUNTER — Other Ambulatory Visit: Payer: Self-pay

## 2018-06-19 DIAGNOSIS — S0181XA Laceration without foreign body of other part of head, initial encounter: Secondary | ICD-10-CM | POA: Insufficient documentation

## 2018-06-19 DIAGNOSIS — Y999 Unspecified external cause status: Secondary | ICD-10-CM | POA: Insufficient documentation

## 2018-06-19 DIAGNOSIS — Y92009 Unspecified place in unspecified non-institutional (private) residence as the place of occurrence of the external cause: Secondary | ICD-10-CM | POA: Insufficient documentation

## 2018-06-19 DIAGNOSIS — F17210 Nicotine dependence, cigarettes, uncomplicated: Secondary | ICD-10-CM | POA: Insufficient documentation

## 2018-06-19 DIAGNOSIS — W010XXA Fall on same level from slipping, tripping and stumbling without subsequent striking against object, initial encounter: Secondary | ICD-10-CM | POA: Insufficient documentation

## 2018-06-19 DIAGNOSIS — Y9389 Activity, other specified: Secondary | ICD-10-CM | POA: Insufficient documentation

## 2018-06-19 MED ORDER — LIDOCAINE-EPINEPHRINE (PF) 2 %-1:200000 IJ SOLN
10.0000 mL | Freq: Once | INTRAMUSCULAR | Status: AC
  Administered 2018-06-19: 10 mL
  Filled 2018-06-19: qty 20

## 2018-06-19 NOTE — Discharge Instructions (Signed)
Please keep area as clean as possible Your stitches need to be removed in 1 week; you may return to the ED for suture removal or you can go to [facility]/Your PCP if you have one Return to the ED if area becomes red, swollen, begins draining pus, or you experience fever/chills

## 2018-06-19 NOTE — ED Triage Notes (Signed)
Pt reports he fell over a baby gait, striking his chin on the hardwood floor. Lac to the chin. Teeth intact. No LOC.

## 2018-06-19 NOTE — ED Provider Notes (Signed)
[facility]-EMERGENCY DEPT Provider Note   CSN: [id number] Arrival date & time: 06/19/18  1944    History   Chief Complaint Chief Complaint  Patient presents with  . Laceration    HPI Mason Jones is a 33 y.o. male with no significant PMHx who presents to the ED for laceration to his chin s/p fall. Pt reports he was walking over a baby gate when he tripped and fell directly onto his chin; causing the laceration. No LOC. Pt was able to get up immediately afterwards. No dizziness, lightheadedness, or vision changes. Able to ambulate in the ED without difficulty. Denies biting tongue. States his teeth feel like they are in place. Tetanus UTD.        Past Medical History:  Diagnosis Date  . Anxiety   . Depression   . Drug abuse (HCC)   . PTSD (post-traumatic stress disorder)     Patient Active Problem List   Diagnosis Date Noted  . Severe episode of recurrent major depressive disorder, without psychotic features (HCC)   . MDD (major depressive disorder), recurrent episode, severe (HCC) 01/12/2015  . Opiate dependence (HCC) 11/14/2013  . 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 Severe recurrent major depression without psychotic features (HCC) 11/13/2013  . Polysubstance dependence (HCC) 11/02/2013  . PTSD (post-traumatic stress disorder) 10/08/2013  . Opioid use with withdrawal (HCC) 10/08/2013  . GAD (generalized anxiety disorder) 10/08/2013  . Major depression, recurrent (HCC) 10/08/2013  . Opioid dependence (HCC) 10/03/2013    History reviewed. No pertinent surgical history.      Home Medications    Prior to Admission medications   Medication Sig Start Date End Date Taking? Authorizing Provider  cephALEXin (KEFLEX) 500 MG capsule Take 1 capsule (500 mg total) by mouth 4 (four) times daily. Patient not taking: Reported on 02/15/2018 11/23/16   Janne Napoleon, NP  citalopram (CELEXA) 20 MG tablet Take 1 tablet (20 mg total) by mouth daily. Patient  not taking: Reported on 02/15/2018 01/15/15   Adonis Brook, NP  cyclobenzaprine (FLEXERIL) 10 MG tablet Take 1 tablet (10 mg total) by mouth 2 (two) times daily as needed for muscle spasms. Patient not taking: Reported on 02/15/2018 11/23/16   Janne Napoleon, NP  diclofenac (VOLTAREN) 50 MG EC tablet Take 1 tablet (50 mg total) by mouth 2 (two) times daily. Patient not taking: Reported on 02/15/2018 11/23/16   Janne Napoleon, NP  gabapentin (NEURONTIN) 100 MG capsule Take 2 capsules (200 mg total) by mouth 2 (two) times daily. Patient not taking: Reported on 02/15/2018 01/15/15   Adonis Brook, NP  methadone (DOLOPHINE) 10 MG/5ML solution Take by mouth every 6 (six) hours as needed for pain.    [provider]  nicotine polacrilex (NICORETTE) 2 MG gum Take 1 each (2 mg total) by mouth as needed for smoking cessation. Patient not taking: Reported on 02/15/2018 01/15/15   Adonis Brook, NP  traMADol (ULTRAM) 50 MG tablet Take 1 tablet (50 mg total) by mouth every 6 (six) hours as needed. Patient not taking: Reported on 02/15/2018 11/23/16   Janne Napoleon, NP    Family History No family history on file.  Social History Social History   Tobacco Use  . Smoking status: Current Every Day Smoker    Packs/day: 1.00    Types: Cigarettes  Substance Use Topics  . Alcohol use: Yes    Comment: 1-2 times per week  . Drug use: Not Currently    Types: Marijuana,  Benzodiazepines, Heroin, Oxycodone, Hydrocodone    Comment: opiates, heroine     Allergies   Patient has no known allergies.   Review of Systems Review of Systems  HENT: Negative for dental problem.   Eyes: Negative for visual disturbance.  Musculoskeletal: Negative for arthralgias and neck pain.  Skin: Positive for wound.  Neurological: Negative for dizziness, syncope, light-headedness and headaches.     Physical Exam Updated Vital Signs BP (!) 141/100 (BP Location: Left Arm)   Pulse 71   Temp 97.8 F (36.6 C)  (Oral)   Resp 16   Ht 5\' 10"  (1.778 m)   Wt 68 kg   SpO2 99%   BMI 21.52 kg/m    Physical Exam Vitals signs and nursing note reviewed.  Constitutional:      Appearance: He is not ill-appearing.  HENT:     Head: Normocephalic. Laceration present. No raccoon eyes or Battle's sign.     Jaw: There is normal jaw occlusion. No trismus, tenderness or malocclusion.     Comments: 3 cm linear laceration to chin (see photos below)    Right Ear: Tympanic membrane normal.     Left Ear: Tympanic membrane normal.     Ears:     Comments: No hemotympanum bilaterally    Mouth/Throat:     Comments: No dental fractures; no pain with palpation Eyes:     Conjunctiva/sclera: Conjunctivae normal.  Neck:     Musculoskeletal: Neck supple.  Cardiovascular:     Rate and Rhythm: Normal rate and regular rhythm.  Pulmonary:     Effort: Pulmonary effort is normal.     Breath sounds: Normal breath sounds.  Abdominal:     Palpations: Abdomen is soft.     Tenderness: There is no abdominal tenderness.  Skin:    General: Skin is warm and dry.     Coloration: Skin is not jaundiced.  Neurological:     Mental Status: He is alert.          ED Treatments / Results  Labs (all labs ordered are listed, but only abnormal results are displayed) Labs Reviewed - No data to display  EKG None  Radiology No results found.  Procedures .Marland KitchenLaceration Repair Date/Time: 06/19/2018 8:36 PM Performed by: Tanda Rockers, PA-C Authorized by: Tanda Rockers, PA-C    LACERATION REPAIR Performed by: Tanda Rockers Authorized by: Tanda Rockers Consent: Verbal consent obtained. Risks and benefits: risks, benefits and alternatives were discussed Consent given by: patient Patient identity confirmed: provided demographic data Prepped and Draped in normal sterile fashion Wound explored  Laceration Location: Chin  Laceration Length: 3 cm  No Foreign Bodies seen or palpated  Anesthesia: local infiltration   Local anesthetic: lidocaine 2% with epinephrine  Anesthetic total: 5 ml  Irrigation method: syringe Amount of cleaning: standard  Skin closure: 5-0 prolene  Number of sutures: 5  Technique: simple interrupted   Patient tolerance: Patient tolerated the procedure well with no immediate complications.    (including critical care time)  Medications Ordered in ED Medications  lidocaine-EPINEPHrine (XYLOCAINE W/EPI) 2 %-1:200000 (PF) injection 10 mL (has no administration in time range)     Initial Impression / Assessment and Plan / ED Course  I have reviewed the triage vital signs and the nursing notes.  Pertinent labs & imaging results that were available during my care of the patient were reviewed by me and considered in my medical decision making (see chart for details).    Pt presents with laceration to chin;  approximately 3 cm in length. No LOC. No hemotympanum bilaterally. Negative raccoon's sign or battle sign. No dental fractures noted; teeth are not malaligned. Per [place] CT Head Rule CT head is unnecessary at this time. Had long discussion with patient regarding need for sutures; he was very hesitant at first but eventually agreed to plan. He tolerated the procedure well; 5 sutures were placed. Discussed that he needs to have them removed in 7 days. Strict return precautions discussed as well. Pt is in agreement with plan and stable for discharge home.        Final Clinical Impressions(s) / ED Diagnoses   Final diagnoses:  Facial laceration, initial encounter    ED Discharge Orders    None       Tanda Rockers, PA-C 06/19/18 2155    Virgina Norfolk, DO 06/19/18 2202

## 2018-09-16 ENCOUNTER — Other Ambulatory Visit: Payer: Self-pay

## 2018-09-16 ENCOUNTER — Emergency Department (HOSPITAL_COMMUNITY)
Admission: EM | Admit: 2018-09-16 | Discharge: 2018-09-16 | Disposition: A | Discharge status: Home / Self Care | Payer: Self-pay | Attending: Emergency Medicine | Admitting: Emergency Medicine

## 2018-09-16 ENCOUNTER — Emergency Department (HOSPITAL_COMMUNITY): Payer: Self-pay

## 2018-09-16 ENCOUNTER — Encounter (HOSPITAL_COMMUNITY): Payer: Self-pay

## 2018-09-16 DIAGNOSIS — F17210 Nicotine dependence, cigarettes, uncomplicated: Secondary | ICD-10-CM | POA: Insufficient documentation

## 2018-09-16 DIAGNOSIS — R05 Cough: Secondary | ICD-10-CM | POA: Insufficient documentation

## 2018-09-16 DIAGNOSIS — R059 Cough, unspecified: Secondary | ICD-10-CM

## 2018-09-16 MED ORDER — AZITHROMYCIN 250 MG PO TABS: 250.0000 mg | ORAL_TABLET | Freq: Every day | ORAL | 0 refills | Status: DC

## 2018-09-16 MED ORDER — ALBUTEROL SULFATE HFA 108 (90 BASE) MCG/ACT IN AERS
4.0000 | INHALATION_SPRAY | Freq: Once | RESPIRATORY_TRACT | Status: AC
  Administered 2018-09-16: 4 via RESPIRATORY_TRACT
  Filled 2018-09-16: qty 6.7

## 2018-09-16 MED ORDER — BENZONATATE 100 MG PO CAPS: 100.0000 mg | ORAL_CAPSULE | Freq: Three times a day (TID) | ORAL | 0 refills | Status: DC | PRN

## 2018-09-16 MED ORDER — BENZONATATE 100 MG PO CAPS
100.0000 mg | ORAL_CAPSULE | Freq: Once | ORAL | Status: AC
  Administered 2018-09-16: 100 mg via ORAL
  Filled 2018-09-16: qty 1

## 2018-09-16 NOTE — ED Notes (Signed)
Patient transported to X-ray 

## 2018-09-16 NOTE — ED Notes (Signed)
Pt ambulated back from x-ray and to the BR

## 2018-09-16 NOTE — ED Triage Notes (Signed)
Pt reports a productive cough for 3 days. States that he has been coughing up brown phlegm. Denies SOB, fever, or headache. Denies sick contacts. A&Ox4.

## 2018-09-17 NOTE — ED Provider Notes (Signed)
[facility] DEPT Provider Note   CSN: [id number] Arrival date & time: 09/16/18  0532     History   Chief Complaint Chief Complaint  Patient presents with  . Cough    HPI Mason Jones is a 33 y.o. male.     The history is provided by the patient.  Cough Cough characteristics:  Productive Sputum characteristics:  [place] Severity:  Mild Onset quality:  Gradual Duration:  5 days Timing:  Constant Progression:  Worsening Chronicity:  New Smoker: yes   Context: not exposure to allergens and not sick contacts   Relieved by:  None tried Ineffective treatments:  None tried Associated symptoms: no chest pain, no chills, no ear pain, no fever, no headaches, no myalgias and no rhinorrhea   Risk factors: no recent infection     Past Medical History:  Diagnosis Date  . Anxiety   . Depression   . Drug abuse ([place])   . PTSD (post-traumatic stress disorder)     Patient Active Problem List   Diagnosis Date Noted  . Severe episode of recurrent major depressive disorder, without psychotic features ([place])   . MDD (major depressive disorder), recurrent episode, severe ([place]) 01/12/2015  . Opiate dependence ([place]) 11/14/2013  . Benzodiazepine abuse ([place]) 11/14/2013  . Severe recurrent major depression without psychotic features ([place]) 11/13/2013  . Polysubstance dependence ([place]) 11/02/2013  . PTSD (post-traumatic stress disorder) 10/08/2013  . Opioid use with withdrawal ([place]) 10/08/2013  . GAD (generalized anxiety disorder) 10/08/2013  . Major depression, recurrent ([place]) 10/08/2013  . Opioid dependence ([place]) 10/03/2013    History reviewed. No pertinent surgical history.      Home Medications    Prior to Admission medications   Medication Sig Start Date End Date Taking? Authorizing Provider  azithromycin (ZITHROMAX) 250 MG tablet Take 1 tablet (250 mg total) by mouth daily. Take first 2 tablets together, then 1 every day until finished. 09/16/18    Clovis Warwick, Corene Cornea, MD  benzonatate (TESSALON) 100 MG capsule Take 1 capsule (100 mg total) by mouth 3 (three) times daily as needed for cough. 09/16/18   Amaziah Ghosh, Corene Cornea, MD  methadone (DOLOPHINE) 10 MG/5ML solution Take by mouth every 6 (six) hours as needed for pain.    [provider]    Family History History reviewed. No pertinent family history.  Social History Social History   Tobacco Use  . Smoking status: Current Every Day Smoker    Packs/day: 1.00    Types: Cigarettes  Substance Use Topics  . Alcohol use: Yes    Comment: 1-2 times per week  . Drug use: Not Currently    Types: Marijuana, Benzodiazepines, Heroin, Oxycodone, Hydrocodone    Comment: opiates, heroine     Allergies   Patient has no known allergies.   Review of Systems Review of Systems  Constitutional: Negative for chills and fever.  HENT: Negative for ear pain and rhinorrhea.   Respiratory: Positive for cough.   Cardiovascular: Negative for chest pain.  Musculoskeletal: Negative for myalgias.  Neurological: Negative for headaches.  All other systems reviewed and are negative.    Physical Exam Updated Vital Signs BP (!) 143/73 (BP Location: Left Arm)   Pulse 64   Temp 98.4 F (36.9 C) (Oral)   Resp 16   Ht 5\' 11"  (1.803 m)   Wt 72.6 kg   SpO2 99%   BMI 22.32 kg/m   Physical Exam Vitals signs and nursing note reviewed.  Constitutional:  Appearance: He is well-developed.  HENT:     Head: Normocephalic and atraumatic.     Nose: Nose normal. No rhinorrhea.     Mouth/Throat:     Mouth: Mucous membranes are moist.  Eyes:     Pupils: Pupils are equal, round, and reactive to light.  Neck:     Musculoskeletal: Normal range of motion.  Cardiovascular:     Rate and Rhythm: Normal rate.  Pulmonary:     Effort: Pulmonary effort is normal. No respiratory distress.     Breath sounds: No stridor. No wheezing or rhonchi.  Abdominal:     General: There is no distension.   Musculoskeletal: Normal range of motion.        General: No swelling.  Neurological:     Mental Status: He is alert.      ED Treatments / Results  Labs (all labs ordered are listed, but only abnormal results are displayed) Labs Reviewed - No data to display  EKG    Radiology Dg Chest 2 View  Result Date: 09/16/2018 CLINICAL DATA:  33 y/o  M; 3 days of productive cough. EXAM: CHEST - 2 VIEW COMPARISON:  None. FINDINGS: The heart size and mediastinal contours are within normal limits. Both lungs are clear. The visualized skeletal structures are unremarkable. IMPRESSION: No acute pulmonary process identified. Electronically Signed   By: Mitzi HansenLance  Furusawa-Stratton M.D.   On: 09/16/2018 06:26    Procedures Procedures (including critical care time)  Medications Ordered in ED Medications  benzonatate (TESSALON) capsule 100 mg (100 mg Oral Given 09/16/18 0615)  albuterol (VENTOLIN HFA) 108 (90 Base) MCG/ACT inhaler 4 puff (4 puffs Inhalation Given 09/16/18 [id number])     Initial Impression / Assessment and Plan / ED Course  I have reviewed the triage vital signs and the nursing notes.  Pertinent labs & imaging results that were available during my care of the patient were reviewed by me and considered in my medical decision making (see chart for details).        Likely bronchitis. z pack given if not improving in a few days. otherwise had significant improvement with tessalon and inhaler. Stable for dc.   Final Clinical Impressions(s) / ED Diagnoses   Final diagnoses:  Cough    ED Discharge Orders         Ordered    benzonatate (TESSALON) 100 MG capsule  3 times daily PRN     09/16/18 0643    azithromycin (ZITHROMAX) 250 MG tablet  Daily     09/16/18 0643           Jerrian Mells, Barbara CowerJason, MD 09/17/18 1119

## 2018-10-20 ENCOUNTER — Emergency Department (HOSPITAL_COMMUNITY)
Admission: EM | Admit: 2018-10-20 | Discharge: 2018-10-21 | Disposition: A | Discharge status: Home / Self Care | Payer: Self-pay | Attending: Emergency Medicine | Admitting: Emergency Medicine

## 2018-10-20 ENCOUNTER — Encounter (HOSPITAL_COMMUNITY): Payer: Self-pay

## 2018-10-20 ENCOUNTER — Emergency Department (HOSPITAL_COMMUNITY): Payer: Self-pay

## 2018-10-20 ENCOUNTER — Other Ambulatory Visit: Payer: Self-pay

## 2018-10-20 DIAGNOSIS — R112 Nausea with vomiting, unspecified: Secondary | ICD-10-CM

## 2018-10-20 DIAGNOSIS — R0602 Shortness of breath: Secondary | ICD-10-CM | POA: Insufficient documentation

## 2018-10-20 DIAGNOSIS — Z79899 Other long term (current) drug therapy: Secondary | ICD-10-CM | POA: Insufficient documentation

## 2018-10-20 DIAGNOSIS — F17210 Nicotine dependence, cigarettes, uncomplicated: Secondary | ICD-10-CM | POA: Insufficient documentation

## 2018-10-20 DIAGNOSIS — R5383 Other fatigue: Secondary | ICD-10-CM | POA: Insufficient documentation

## 2018-10-20 DIAGNOSIS — R748 Abnormal levels of other serum enzymes: Secondary | ICD-10-CM

## 2018-10-20 DIAGNOSIS — R109 Unspecified abdominal pain: Secondary | ICD-10-CM

## 2018-10-20 LAB — COMPREHENSIVE METABOLIC PANEL
ALT: 82 U/L — ABNORMAL HIGH (ref 0–44)
AST: 124 U/L — ABNORMAL HIGH (ref 15–41)
Albumin: 4.2 g/dL (ref 3.5–5.0)
Alkaline Phosphatase: 97 U/L (ref 38–126)
Anion gap: 11 (ref 5–15)
BUN: 9 mg/dL (ref 6–20)
CO2: 26 mmol/L (ref 22–32)
Calcium: 9.3 mg/dL (ref 8.9–10.3)
Chloride: 103 mmol/L (ref 98–111)
Creatinine, Ser: 0.64 mg/dL (ref 0.61–1.24)
GFR calc Af Amer: >60 mL/min (ref >60)
GFR calc non Af Amer: >60 mL/min (ref >60)
Glucose, Bld: 87 mg/dL (ref 70–99)
Potassium: 3.9 mmol/L (ref 3.5–5.1)
Sodium: 140 mmol/L (ref 135–145)
Total Bilirubin: 0.4 mg/dL (ref 0.3–1.2)
Total Protein: 7.9 g/dL (ref 6.5–8.1)

## 2018-10-20 LAB — CBC
HCT: 46.4 % (ref 39.0–52.0)
Hemoglobin: 15.1 g/dL (ref 13.0–17.0)
MCH: 34.1 pg — ABNORMAL HIGH (ref 26.0–34.0)
MCHC: 32.5 g/dL (ref 30.0–36.0)
MCV: 104.7 fL — ABNORMAL HIGH (ref 80.0–100.0)
Platelets: 243 10*3/uL (ref 150–400)
RBC: 4.43 MIL/uL (ref 4.22–5.81)
RDW: 12 % (ref 11.5–15.5)
WBC: 4.5 10*3/uL (ref 4.0–10.5)
nRBC: 0 % (ref 0.0–0.2)

## 2018-10-20 LAB — URINALYSIS, ROUTINE W REFLEX MICROSCOPIC
Bilirubin Urine: NEGATIVE
Glucose, UA: NEGATIVE mg/dL
Hgb urine dipstick: NEGATIVE
Ketones, ur: NEGATIVE mg/dL
Leukocytes,Ua: NEGATIVE
Nitrite: NEGATIVE
Protein, ur: NEGATIVE mg/dL
Specific Gravity, Urine: 1.017 (ref 1.005–1.030)
pH: 6 (ref 5.0–8.0)

## 2018-10-20 LAB — LIPASE, BLOOD: Lipase: 46 U/L (ref 11–51)

## 2018-10-20 MED ORDER — ONDANSETRON 4 MG PO TBDP
4.0000 mg | ORAL_TABLET | Freq: Once | ORAL | Status: AC | PRN
  Administered 2018-10-20: 4 mg via ORAL
  Filled 2018-10-20: qty 1

## 2018-10-20 MED ORDER — ONDANSETRON HCL 4 MG PO TABS: 4.0000 mg | ORAL_TABLET | Freq: Three times a day (TID) | ORAL | 0 refills | Status: DC | PRN

## 2018-10-20 MED ORDER — SODIUM CHLORIDE 0.9% FLUSH: 3.0000 mL | Freq: Once | INTRAVENOUS | Status: DC

## 2018-10-20 MED ORDER — FAMOTIDINE 20 MG PO TABS
20.0000 mg | ORAL_TABLET | Freq: Once | ORAL | Status: AC
  Administered 2018-10-20: 20 mg via ORAL
  Filled 2018-10-20: qty 1

## 2018-10-20 MED ORDER — POLYETHYLENE GLYCOL 3350 17 G PO PACK: 17.0000 g | PACK | Freq: Every day | ORAL | 0 refills | Status: DC

## 2018-10-20 MED ORDER — FAMOTIDINE 20 MG PO TABS: 20.0000 mg | ORAL_TABLET | Freq: Two times a day (BID) | ORAL | 0 refills | Status: DC

## 2018-10-20 MED ORDER — SUCRALFATE 1 G PO TABS
1.0000 g | ORAL_TABLET | Freq: Once | ORAL | Status: AC
  Administered 2018-10-20: 1 g via ORAL
  Filled 2018-10-20: qty 1

## 2018-10-20 NOTE — ED Provider Notes (Signed)
[facility]-EMERGENCY DEPT Provider Note   CSN: [id number] Arrival date & time: 10/20/18  1740    History   Chief Complaint Chief Complaint  Patient presents with  . Abdominal Pain  . Emesis  . Shortness of Breath  . Fatigue    HPI Mason Jones is a 33 y.o. male.     HPI   Pt is a 33 y/o male with a h/o anxiety/depression, drug abuse, PTSD, currently on methadone who presents to the ED today c/o abd pain that began last night. Pain located to the epigastrium and LUQ. Pain is intermittent and lasts about 10-15 minutes. States that pain seems to get better after he vomits. He states he has had similar sxs in the past for the last several months. He has not followed up yet.  He has no abdominal pain currently.  Pain associated with NV. Denies hematemesis. Denies diarrhea. He reports constipation, last BM was 3 days ago. Denies any fevers. Denies dysuria, frequency, urgency, or hematuria.   He states he has had a cough for several months since before COVID started.  He was diagnosed with bronchitis a month ago. He has had intermittent sob, but none currently. He gets some Sob with activity since he was diagnosed with bronchitis. This symptom is unchanged for the last several months. No chest pain. No known COVID exposures.    States he drinks about 2 beers daily. He last drank ETOH last night. He has not used IVDU for 2 years.   Past Medical History:  Diagnosis Date  . Anxiety   . Depression   . Drug abuse ([facility])   . PTSD (post-traumatic stress disorder)     Patient Active Problem List   Diagnosis Date Noted  . Severe episode of recurrent major depressive disorder, without psychotic features ([facility])   . MDD (major depressive disorder), recurrent episode, severe ([facility]) 01/12/2015  . Opiate dependence ([facility]) 11/14/2013  . 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 Severe recurrent major depression without psychotic features ([facility]) 11/13/2013  . Polysubstance  dependence ([facility]) 11/02/2013  . PTSD (post-traumatic stress disorder) 10/08/2013  . Opioid use with withdrawal ([facility]) 10/08/2013  . GAD (generalized anxiety disorder) 10/08/2013  . Major depression, recurrent ([facility]) 10/08/2013  . Opioid dependence ([facility]) 10/03/2013    History reviewed. No pertinent surgical history.    Home Medications    Prior to Admission medications   Medication Sig Start Date End Date Taking? Authorizing Provider  azithromycin (ZITHROMAX) 250 MG tablet Take 1 tablet (250 mg total) by mouth daily. Take first 2 tablets together, then 1 every day until finished. 09/16/18   Mesner, Barbara CowerJason, MD  benzonatate (TESSALON) 100 MG capsule Take 1 capsule (100 mg total) by mouth 3 (three) times daily as needed for cough. 09/16/18   Mesner, Barbara CowerJason, MD  famotidine (PEPCID) 20 MG tablet Take 1 tablet (20 mg total) by mouth 2 (two) times daily for 7 days. 10/20/18 10/27/18  Jamesen Stahnke S, PA-C  methadone (DOLOPHINE) 10 MG/5ML solution Take by mouth every 6 (six) hours as needed for pain.    [provider]  ondansetron (ZOFRAN) 4 MG tablet Take 1 tablet (4 mg total) by mouth every 8 (eight) hours as needed for nausea or vomiting. 10/20/18   Wade Asebedo S, PA-C  polyethylene glycol (MIRALAX) 17 g packet Take 17 g by mouth daily. Dissolve one cap full in solution (water, gatorade, etc.) and administer once cap-full daily. You may titrate up daily by 1 cap-full until  the patient is having pudding consistency of stools. After the patient is able to start passing softer stools they will need to be on 1/2 cap-full daily for 2 weeks. 10/20/18   Airon Sahni S, PA-C    Family History Family History  Problem Relation Age of Onset  . Emphysema Mother   . Hepatitis C Mother   . Chronic bronchitis Mother     Social History Social History   Tobacco Use  . Smoking status: Current Every Day Smoker    Packs/day: 1.00    Types: Cigarettes  . Smokeless tobacco: Current User    Types:  Snuff  Substance Use Topics  . Alcohol use: Yes    Comment: 1-2 times per week  . Drug use: Not Currently    Types: Marijuana, Benzodiazepines, Heroin, Oxycodone, Hydrocodone    Comment: opiates, heroin     Allergies   Patient has no known allergies.   Review of Systems Review of Systems  Constitutional: Negative for chills and fever.  HENT: Negative for ear pain and sore throat.   Eyes: Negative for visual disturbance.  Respiratory: Negative for cough and shortness of breath.   Cardiovascular: Negative for chest pain.  Gastrointestinal: Positive for abdominal pain, constipation, nausea and vomiting. Negative for blood in stool and diarrhea.  Genitourinary: Negative for dysuria, frequency, hematuria and urgency.  Musculoskeletal: Negative for back pain.  Skin: Negative for color change and rash.  Neurological: Negative for headaches.  All other systems reviewed and are negative.   Physical Exam Updated Vital Signs BP 136/82 (BP Location: Left Arm)   Pulse 63   Temp 98.7 F (37.1 C) (Oral)   Resp 16   Ht 5\' 10"  (1.778 m)   Wt 74.8 kg   SpO2 100%   BMI 23.68 kg/m   Physical Exam Vitals signs and nursing note reviewed.  Constitutional:      General: He is not in acute distress.    Appearance: He is well-developed. He is not ill-appearing, toxic-appearing or diaphoretic.  HENT:     Head: Normocephalic and atraumatic.  Eyes:     Conjunctiva/sclera: Conjunctivae normal.  Neck:     Musculoskeletal: Neck supple.  Cardiovascular:     Rate and Rhythm: Normal rate and regular rhythm.     Heart sounds: No murmur.  Pulmonary:     Effort: Pulmonary effort is normal. No respiratory distress.     Breath sounds: Normal breath sounds. No wheezing, rhonchi or rales.  Abdominal:     General: Bowel sounds are normal.     Palpations: Abdomen is soft.     Tenderness: There is no abdominal tenderness. There is no right CVA tenderness, left CVA tenderness, guarding or rebound.   Skin:    General: Skin is warm.  Neurological:     Mental Status: He is alert.      ED Treatments / Results  Labs (all labs ordered are listed, but only abnormal results are displayed) Labs Reviewed  COMPREHENSIVE METABOLIC PANEL - Abnormal; Notable for the following components:      Result Value   AST 124 (*)    ALT 82 (*)    All other components within normal limits  CBC - Abnormal; Notable for the following components:   MCV 104.7 (*)    MCH 34.1 (*)    All other components within normal limits  LIPASE, BLOOD  URINALYSIS, ROUTINE W REFLEX MICROSCOPIC    EKG None  Radiology Dg Chest Portable 1 View  Result  Date: 10/20/2018 CLINICAL DATA:  33 year old male with shortness of breath and cough EXAM: PORTABLE CHEST 1 VIEW COMPARISON:  Chest radiograph dated 09/16/2018 FINDINGS: The heart size and mediastinal contours are within normal limits. Both lungs are clear. The visualized skeletal structures are unremarkable. IMPRESSION: No active disease. Electronically Signed   By: Anner Crete M.D.   On: 10/20/2018 22:29    Procedures Procedures (including critical care time)  Medications Ordered in ED Medications  sodium chloride flush (NS) 0.9 % injection 3 mL (has no administration in time range)  ondansetron (ZOFRAN-ODT) disintegrating tablet 4 mg (4 mg Oral Given 10/20/18 1828)  famotidine (PEPCID) tablet 20 mg (20 mg Oral Given 10/20/18 2233)  sucralfate (CARAFATE) tablet 1 g (1 g Oral Given 10/20/18 2233)     Initial Impression / Assessment and Plan / ED Course  I have reviewed the triage vital signs and the nursing notes.  Pertinent labs & imaging results that were available during my care of the patient were reviewed by me and considered in my medical decision making (see chart for details).   Final Clinical Impressions(s) / ED Diagnoses   Final diagnoses:  Abdominal pain, unspecified abdominal location  Non-intractable vomiting with nausea, unspecified  vomiting type  Elevated liver enzymes   Patient presenting for evaluation of abdominal pain nausea and vomiting that started yesterday.  Has had chronic symptoms similarly for the last several months.  Also mentions that he has had a cough and intermittent dyspnea for several months that is unchanged today.  He was diagnosed with bronchitis a month ago.  Abdominal pain currently resolved on my exam.  Does drink alcohol daily.  Vital signs stable.  Afebrile.  On exam he has no abdominal tenderness.  Normal active bowel sounds.  Lungs are clear to auscultation bilaterally.  Heart with regular rate and rhythm.  Nontoxic, nonseptic appearing.  Labs reviewed, CBC shows no leukocytosis or anemia. CMP shows elevated AST/ALT consistent with alcohol induced liver injury.  Bilirubin is normal.  No right upper quadrant tenderness to suggest gallbladder pathology.  Creatinine is normal.  Electrolytes are normal. Lipase is negative UA is without evidence of UTI.  Chest x-ray does not show any evidence of pneumonia or pneumothorax.  Symptoms seem less likely to be related to COVID given patient symptoms started prior to [place].  Feel that he can follow-up as an outpatient for this as his vital signs are normal and he is in no respiratory distress.  He has no chest pain or exertional chest pain. No murmur exam.  With regard to his abdominal pain nausea and vomiting, suspect he may have alcoholic gastritis.  I doubt other acute cause of symptoms that would require further work-up or admission to the hospital at this time.  He was given Pepcid and Zofran in the ED and was able to tolerate p.o.  On re-eval continues to have no abdominal pain.  Will DC with Pepcid.  Also reports chronic constipation due to his chronic methadone use.  We will also give Rx for MiraLAX.  Will give information for PCP and gastroenterology for follow-up.  We will have him follow-up in the ED if he has any new or worsening symptoms in the  meantime.  He voiced understanding of plan and reasons return.  Questions answered.  Patient stable for discharge. I suspect that his GI symptoms  ED Discharge Orders         Ordered    famotidine (PEPCID) 20 MG tablet  2  times daily     10/20/18 2330    ondansetron (ZOFRAN) 4 MG tablet  Every 8 hours PRN     10/20/18 2330    polyethylene glycol (MIRALAX) 17 g packet  Daily     10/20/18 2330           Karrie MeresCouture, Jacky Hartung S, PA-C 10/20/18 2330    Pricilla LovelessGoldston, Scott, MD 10/21/18 1544

## 2018-10-20 NOTE — ED Notes (Signed)
Pt verbalized discharge instructions and follow up care. Alert and ambulatory. No iv.  

## 2018-10-20 NOTE — ED Triage Notes (Signed)
Patient c/o mid abdominal pain and vomiting since last night. Patient denies any diarrhea. Patient states he was diagnosed with bronchitis 1 month ago and was placed on antibiotics, but states he never improved. Patient reports a productive cough with yellow/brown sputum. patient denies a fever.

## 2018-10-20 NOTE — Discharge Instructions (Addendum)
You were given medications to help with your symptoms and your abdominal pain.  Please take as directed.  Please follow-up with the [facility] to establish primary care and to follow-up about your symptoms.  You were also given a referral to the gastroenterology doctor to follow-up about your abdominal pain.   Please return to the emergency department for any new or worsening symptoms.

## 2018-10-20 NOTE — ED Notes (Signed)
Pt given 4oz of water. No reports of emesis and no trouble swallowing

## 2018-10-20 NOTE — ED Notes (Signed)
Pt ambulated to the bathroom without assistance. Gait steady. Urine at bedside if needed

## 2019-05-21 ENCOUNTER — Other Ambulatory Visit: Payer: Self-pay

## 2019-05-21 ENCOUNTER — Emergency Department (HOSPITAL_COMMUNITY)
Admission: EM | Admit: 2019-05-21 | Discharge: 2019-05-22 | Disposition: A | Discharge status: Home / Self Care | Payer: Self-pay | Attending: Emergency Medicine | Admitting: Emergency Medicine

## 2019-05-21 ENCOUNTER — Emergency Department (HOSPITAL_COMMUNITY): Payer: Self-pay

## 2019-05-21 ENCOUNTER — Encounter (HOSPITAL_COMMUNITY): Payer: Self-pay

## 2019-05-21 DIAGNOSIS — R079 Chest pain, unspecified: Secondary | ICD-10-CM

## 2019-05-21 DIAGNOSIS — R0789 Other chest pain: Secondary | ICD-10-CM | POA: Insufficient documentation

## 2019-05-21 DIAGNOSIS — R112 Nausea with vomiting, unspecified: Secondary | ICD-10-CM | POA: Insufficient documentation

## 2019-05-21 DIAGNOSIS — R001 Bradycardia, unspecified: Secondary | ICD-10-CM | POA: Insufficient documentation

## 2019-05-21 DIAGNOSIS — Z79899 Other long term (current) drug therapy: Secondary | ICD-10-CM | POA: Insufficient documentation

## 2019-05-21 DIAGNOSIS — Z20822 Contact with and (suspected) exposure to covid-19: Secondary | ICD-10-CM | POA: Insufficient documentation

## 2019-05-21 DIAGNOSIS — F17210 Nicotine dependence, cigarettes, uncomplicated: Secondary | ICD-10-CM | POA: Insufficient documentation

## 2019-05-21 LAB — BASIC METABOLIC PANEL
Anion gap: 12 (ref 5–15)
BUN: 18 mg/dL (ref 6–20)
CO2: 23 mmol/L (ref 22–32)
Calcium: 9.8 mg/dL (ref 8.9–10.3)
Chloride: 102 mmol/L (ref 98–111)
Creatinine, Ser: 0.74 mg/dL (ref 0.61–1.24)
GFR calc Af Amer: >60 mL/min (ref >60)
GFR calc non Af Amer: >60 mL/min (ref >60)
Glucose, Bld: 102 mg/dL — ABNORMAL HIGH (ref 70–99)
Potassium: 3.5 mmol/L (ref 3.5–5.1)
Sodium: 137 mmol/L (ref 135–145)

## 2019-05-21 LAB — CBC
HCT: 43.9 % (ref 39.0–52.0)
Hemoglobin: 15.3 g/dL (ref 13.0–17.0)
MCH: 34 pg (ref 26.0–34.0)
MCHC: 34.9 g/dL (ref 30.0–36.0)
MCV: 97.6 fL (ref 80.0–100.0)
Platelets: 242 10*3/uL (ref 150–400)
RBC: 4.5 MIL/uL (ref 4.22–5.81)
RDW: 11.8 % (ref 11.5–15.5)
WBC: 6 10*3/uL (ref 4.0–10.5)
nRBC: 0 % (ref 0.0–0.2)

## 2019-05-21 LAB — TROPONIN I (HIGH SENSITIVITY): Troponin I (High Sensitivity): 43 ng/L — ABNORMAL HIGH (ref <18)

## 2019-05-21 NOTE — ED Provider Notes (Signed)
[facility]-EMERGENCY DEPT Provider Note   CSN: [id number] Arrival date & time: 05/21/19  2149     History Chief Complaint  Patient presents with  . Shortness of Breath    Mason Jones is a 34 y.o. male with a past medical history of polysubstance abuse who is 2 years clean and currently on methadone maintenance therapy.  The patient presents emergency department with flulike illness.  Again having symptoms about 4 days ago with pressure on his chest, occasional chest pain, headaches, runny nose, body aches, decreased appetite and intermittent vomiting.  He states that the day he had dark red vomitus one time.  He has had 5 coworkers with coronavirus.  He denies loss of sense of smell or taste.  He denies using any sympathomimetics or other drugs recently.  HPI     Past Medical History:  Diagnosis Date  . Anxiety   . Depression   . Drug abuse (HCC)   . PTSD (post-traumatic stress disorder)     Patient Active Problem List   Diagnosis Date Noted  . Severe episode of recurrent major depressive disorder, without psychotic features (HCC)   . MDD (major depressive disorder), recurrent episode, severe (HCC) 01/12/2015  . Opiate dependence (HCC) 11/14/2013  . 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 Severe recurrent major depression without psychotic features (HCC) 11/13/2013  . Polysubstance dependence (HCC) 11/02/2013  . PTSD (post-traumatic stress disorder) 10/08/2013  . Opioid use with withdrawal (HCC) 10/08/2013  . GAD (generalized anxiety disorder) 10/08/2013  . Major depression, recurrent (HCC) 10/08/2013  . Opioid dependence (HCC) 10/03/2013    History reviewed. No pertinent surgical history.     Family History  Problem Relation Age of Onset  . Emphysema Mother   . Hepatitis C Mother   . Chronic bronchitis Mother     Social History   Tobacco Use  . Smoking status: Current Every Day Smoker    Packs/day: 1.00    Types: Cigarettes  .  Smokeless tobacco: Current User    Types: Snuff  Substance Use Topics  . Alcohol use: Yes    Comment: 1-2 times per week  . Drug use: Not Currently    Types: Marijuana, Benzodiazepines, Heroin, Oxycodone, Hydrocodone    Comment: opiates, heroin    Home Medications Prior to Admission medications   Medication Sig Start Date End Date Taking? Authorizing Provider  methadone (DOLOPHINE) 10 MG/5ML solution Take 125 mg by mouth daily.    Yes [provider]  azithromycin (ZITHROMAX) 250 MG tablet Take 1 tablet (250 mg total) by mouth daily. Take first 2 tablets together, then 1 every day until finished. Patient not taking: Reported on 05/22/2019 09/16/18   Mesner, Barbara Cower, MD  benzonatate (TESSALON) 100 MG capsule Take 1 capsule (100 mg total) by mouth 3 (three) times daily as needed for cough. Patient not taking: Reported on 05/22/2019 09/16/18   Mesner, Barbara Cower, MD  famotidine (PEPCID) 20 MG tablet Take 1 tablet (20 mg total) by mouth 2 (two) times daily for 7 days. Patient not taking: Reported on 05/22/2019 10/20/18 10/27/18  Couture, Cortni S, PA-C  metoCLOPramide (REGLAN) 10 MG tablet Take 1 tablet (10 mg total) by mouth every 6 (six) hours as needed for nausea (nausea/headache). 05/22/19   Angeletta Goelz, PA-C  ondansetron (ZOFRAN) 4 MG tablet Take 1 tablet (4 mg total) by mouth every 8 (eight) hours as needed for nausea or vomiting. Patient not taking: Reported on 05/22/2019 10/20/18   Karrie Meres, PA-C  polyethylene glycol (MIRALAX) 17 g packet Take 17 g by mouth daily. Dissolve one cap full in solution (water, gatorade, etc.) and administer once cap-full daily. You may titrate up daily by 1 cap-full until the patient is having pudding consistency of stools. After the patient is able to start passing softer stools they will need to be on 1/2 cap-full daily for 2 weeks. Patient not taking: Reported on 05/22/2019 10/20/18   Couture, Cortni S, PA-C    Allergies    Patient has no known  allergies.  Review of Systems   Review of Systems Ten systems reviewed and are negative for acute change, except as noted in the HPI.   Physical Exam Updated Vital Signs BP 133/66   Pulse 74   Temp 98.1 F (36.7 C)   Resp 20   SpO2 97%   Physical Exam Vitals and nursing note reviewed.  Constitutional:      General: He is not in acute distress.    Appearance: He is well-developed. He is not diaphoretic.  HENT:     Head: Normocephalic and atraumatic.  Eyes:     General: No scleral icterus.    Conjunctiva/sclera:     Right eye: Right conjunctiva is injected.     Left eye: Left conjunctiva is injected.  Cardiovascular:     Rate and Rhythm: Normal rate and regular rhythm.     Heart sounds: Normal heart sounds.  Pulmonary:     Effort: Pulmonary effort is normal. No tachypnea, accessory muscle usage or respiratory distress.     Breath sounds: Normal breath sounds. No decreased breath sounds or wheezing.  Abdominal:     Palpations: Abdomen is soft.     Tenderness: There is no abdominal tenderness.  Musculoskeletal:     Cervical back: Normal range of motion and neck supple.     Right lower leg: No edema.     Left lower leg: No edema.  Skin:    General: Skin is warm and dry.  Neurological:     Mental Status: He is alert.  Psychiatric:        Behavior: Behavior normal.     ED Results / Procedures / Treatments   Labs (all labs ordered are listed, but only abnormal results are displayed) Labs Reviewed  BASIC METABOLIC PANEL - Abnormal; Notable for the following components:      Result Value   Glucose, Bld 102 (*)    All other components within normal limits  HEPATIC FUNCTION PANEL - Abnormal; Notable for the following components:   Total Protein 8.2 (*)    AST 127 (*)    ALT 80 (*)    Indirect Bilirubin 1.0 (*)    All other components within normal limits  TROPONIN I (HIGH SENSITIVITY) - Abnormal; Notable for the following components:   Troponin I (High Sensitivity)  43 (*)    All other components within normal limits  TROPONIN I (HIGH SENSITIVITY) - Abnormal; Notable for the following components:   Troponin I (High Sensitivity) 34 (*)    All other components within normal limits  SARS CORONAVIRUS 2 (TAT 6-24 HRS)  CBC  RAPID URINE DRUG SCREEN, HOSP PERFORMED  POC SARS CORONAVIRUS 2 AG -  ED  POC SARS CORONAVIRUS 2 AG -  ED    EKG None   ED ECG REPORT   Date: 05/22/2019  Rate: 93  Rhythm: ventricular bigeminy  QRS Axis: normal  Intervals: PR prolonged  ST/T Wave abnormalities: normal and nonspecific ST/T changes  Conduction Disutrbances:none  Narrative Interpretation:   Old EKG Reviewed: none available  I have personally reviewed the EKG tracing and agree with the computerized printout as noted.  Radiology DG Chest 2 View  Result Date: 05/21/2019 CLINICAL DATA:  34 year old male with shortness of breath EXAM: CHEST - 2 VIEW COMPARISON:  Chest radiograph dated 10/20/2018 FINDINGS: The heart size and mediastinal contours are within normal limits. Both lungs are clear. The visualized skeletal structures are unremarkable. IMPRESSION: No active cardiopulmonary disease. Electronically Signed   By: Elgie Collard M.D.   On: 05/21/2019 22:28    Procedures Procedures (including critical care time)  Medications Ordered in ED Medications - No data to display  ED Course  I have reviewed the triage vital signs and the nursing notes.  Pertinent labs & imaging results that were available during my care of the patient were reviewed by me and considered in my medical decision making (see chart for details).    MDM Rules/Calculators/A&P                      34 year old male here with complaint of flulike symptoms. The emergent differential diagnosis of chest pain includes: Acute coronary syndrome, pericarditis, aortic dissection, pulmonary embolism, tension pneumothorax, pneumonia, and esophageal rupture.  He does have some chest pain here  with an elevated troponin which is trending downward and of insignificant value.  Patient point-of-care Covid test negative and I have sent off for the 6 to 24-hour Covid PCR. Patient's hepatic function panel shows elevated AST and ALT which appear chronic.  UDS negative.  BMP without abnormality.  CBC shows no elevated white blood cell count.  I personally reviewed the patient's 2 view chest x-ray which shows no evidence of patchy infiltrate. EKG shows ventricular bigeminy at a rate of 96.  Patient's heart rate has improved throughout his visit.  He has no active chest pain at this time. Patient will be discharged to self quarantine until his Covid test result.  He appears otherwise appropriate for discharge at this time. Final Clinical Impression(s) / ED Diagnoses Final diagnoses:  Suspected COVID-19 virus infection  Chest pain, unspecified type  Sinus bradycardia  Non-intractable vomiting with nausea, unspecified vomiting type    Rx / DC Orders ED Discharge Orders         Ordered    metoCLOPramide (REGLAN) 10 MG tablet  Every 6 hours PRN     05/22/19 0349           Arthor Captain, PA-C 05/22/19 0541    Ward, Layla Maw, DO 05/22/19 [phone]

## 2019-05-21 NOTE — ED Triage Notes (Signed)
Patient arrived stating that four days ago he began having shortness of breath, occasional chest pain, headaches, and a runny nose. States he has had one episode of vomiting that appeared dark red. Reports five coworkers have tested positive for [employer]

## 2019-05-22 ENCOUNTER — Other Ambulatory Visit: Payer: Self-pay

## 2019-05-22 LAB — RAPID URINE DRUG SCREEN, HOSP PERFORMED
Amphetamines: NOT DETECTED
Barbiturates: NOT DETECTED
Benzodiazepines: NOT DETECTED
Cocaine: NOT DETECTED
Opiates: NOT DETECTED
Tetrahydrocannabinol: NOT DETECTED

## 2019-05-22 LAB — HEPATIC FUNCTION PANEL
ALT: 80 U/L — ABNORMAL HIGH (ref 0–44)
AST: 127 U/L — ABNORMAL HIGH (ref 15–41)
Albumin: 4.4 g/dL (ref 3.5–5.0)
Alkaline Phosphatase: 111 U/L (ref 38–126)
Bilirubin, Direct: 0.2 mg/dL (ref 0.0–0.2)
Indirect Bilirubin: 1 mg/dL — ABNORMAL HIGH (ref 0.3–0.9)
Total Bilirubin: 1.2 mg/dL (ref 0.3–1.2)
Total Protein: 8.2 g/dL — ABNORMAL HIGH (ref 6.5–8.1)

## 2019-05-22 LAB — SARS CORONAVIRUS 2 (TAT 6-24 HRS): SARS Coronavirus 2: NEGATIVE

## 2019-05-22 LAB — TROPONIN I (HIGH SENSITIVITY): Troponin I (High Sensitivity): 34 ng/L — ABNORMAL HIGH (ref <18)

## 2019-05-22 LAB — POC SARS CORONAVIRUS 2 AG -  ED: SARS Coronavirus 2 Ag: NEGATIVE

## 2019-05-22 MED ORDER — METOCLOPRAMIDE HCL 10 MG PO TABS: 10.0000 mg | ORAL_TABLET | Freq: Four times a day (QID) | ORAL | 0 refills | Status: DC | PRN

## 2019-05-22 NOTE — Discharge Instructions (Signed)
Please stay home until your results have posted.  Practice home quarantine until you know your results. You may take over the counter cough medication like DayQuil or theraflu. Return to the emergency department immediately if you develop any of the following symptoms: Your caregiver has diagnosed you as having chest pain that is not specific for one problem, but does not require admission.  You are at low risk for an acute heart condition or other serious illness. Chest pain comes from many different causes.  SEEK IMMEDIATE MEDICAL ATTENTION IF: You have severe chest pain, especially if the pain is crushing or pressure-like and spreads to the arms, back, neck, or jaw, or if you have sweating, nausea (feeling sick to your stomach), or shortness of breath. THIS IS AN EMERGENCY. Don't wait to see if the pain will go away. Get medical help at once. Call 911 or 0 (operator). DO NOT drive yourself to the hospital.  Your chest pain gets worse and does not go away with rest.  You have an attack of chest pain lasting longer than usual, despite rest and treatment with the medications your caregiver has prescribed.  You wake from sleep with chest pain or shortness of breath.  You feel dizzy or faint.  You have chest pain not typical of your usual pain for which you originally saw your caregiver.

## 2019-10-06 IMAGING — DX PORTABLE CHEST - 1 VIEW
1 series · 1 of 1 positions shown · non-contrast
Comparison: Chest radiograph dated 09/16/2018

CLINICAL DATA: 32-year-old male with shortness of breath and cough

EXAM:
PORTABLE CHEST 1 VIEW

[chest ap]
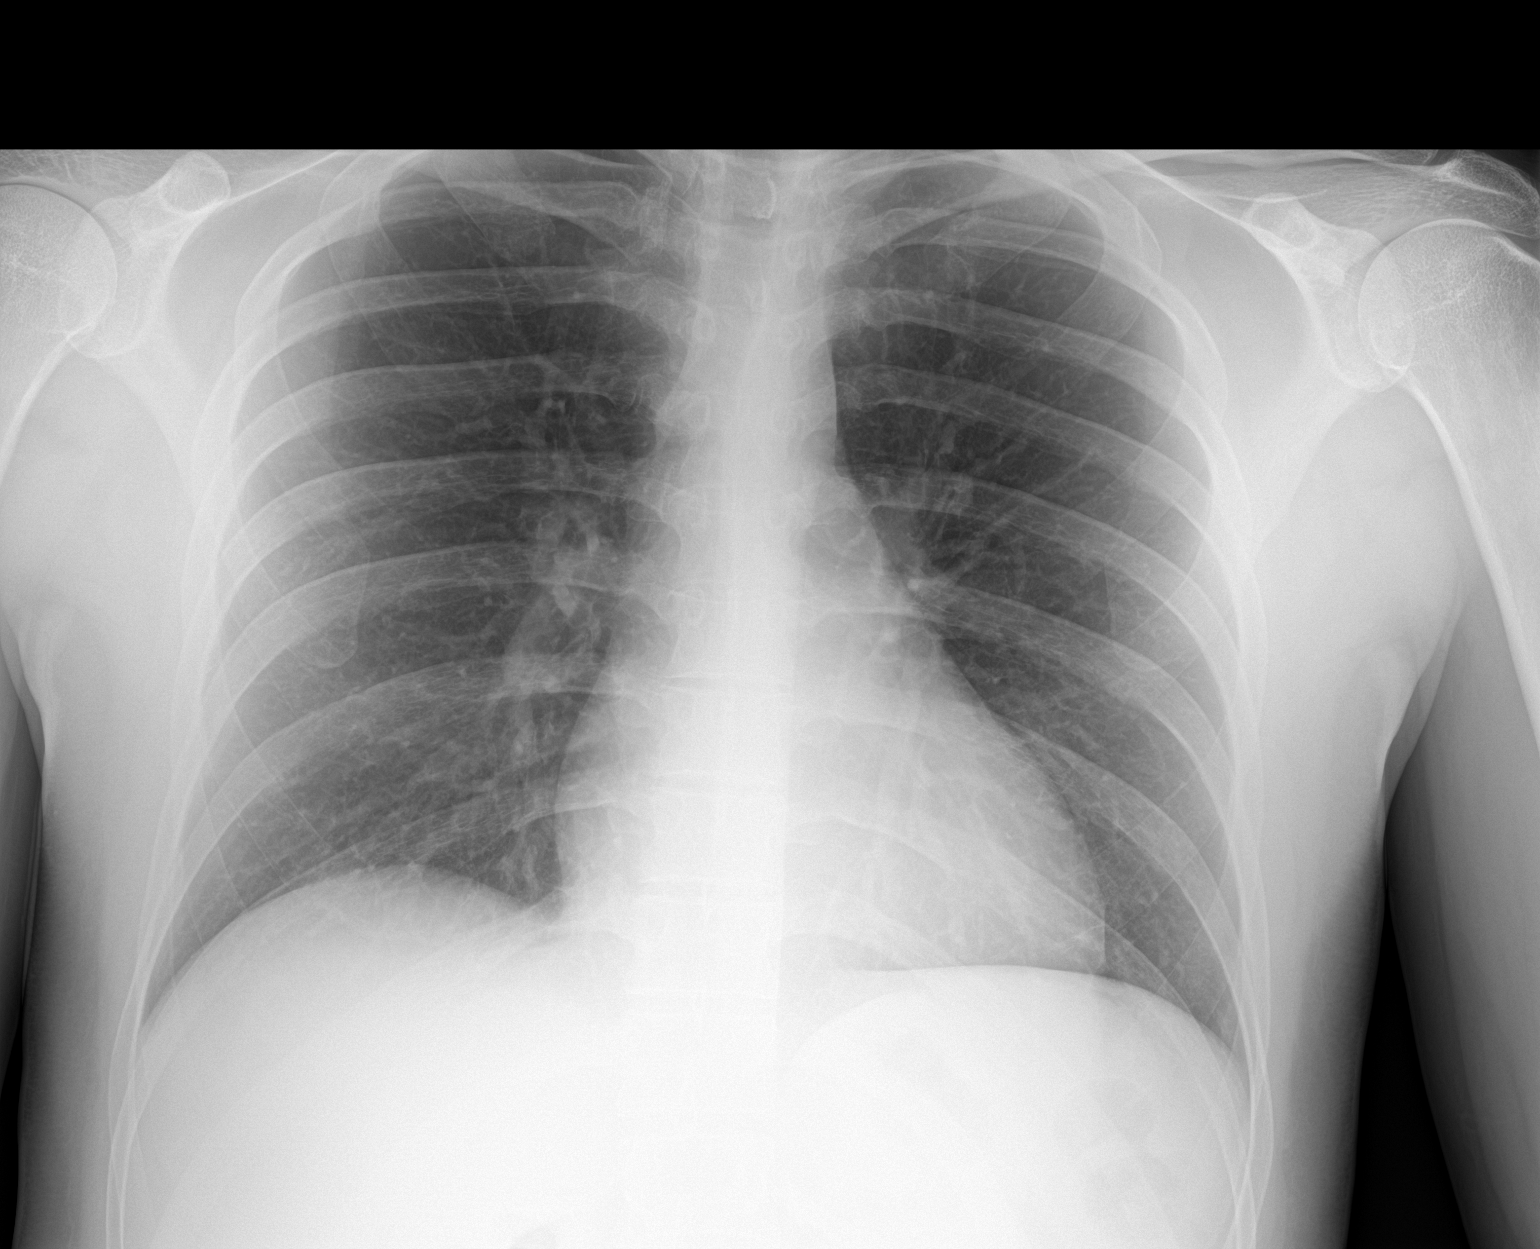

[1 of 1 positions shown; findings below may reference images not displayed]

FINDINGS: The heart size and mediastinal contours are within normal limits.
Both lungs are clear. The visualized skeletal structures are
unremarkable.
IMPRESSION: No active disease.

## 2020-05-06 IMAGING — CR DG CHEST 2V
2 series · 2 of 2 positions shown · non-contrast
Comparison: Chest radiograph dated 10/20/2018

CLINICAL DATA: 33-year-old male with shortness of breath

EXAM:
CHEST - 2 VIEW

[w chest pa]
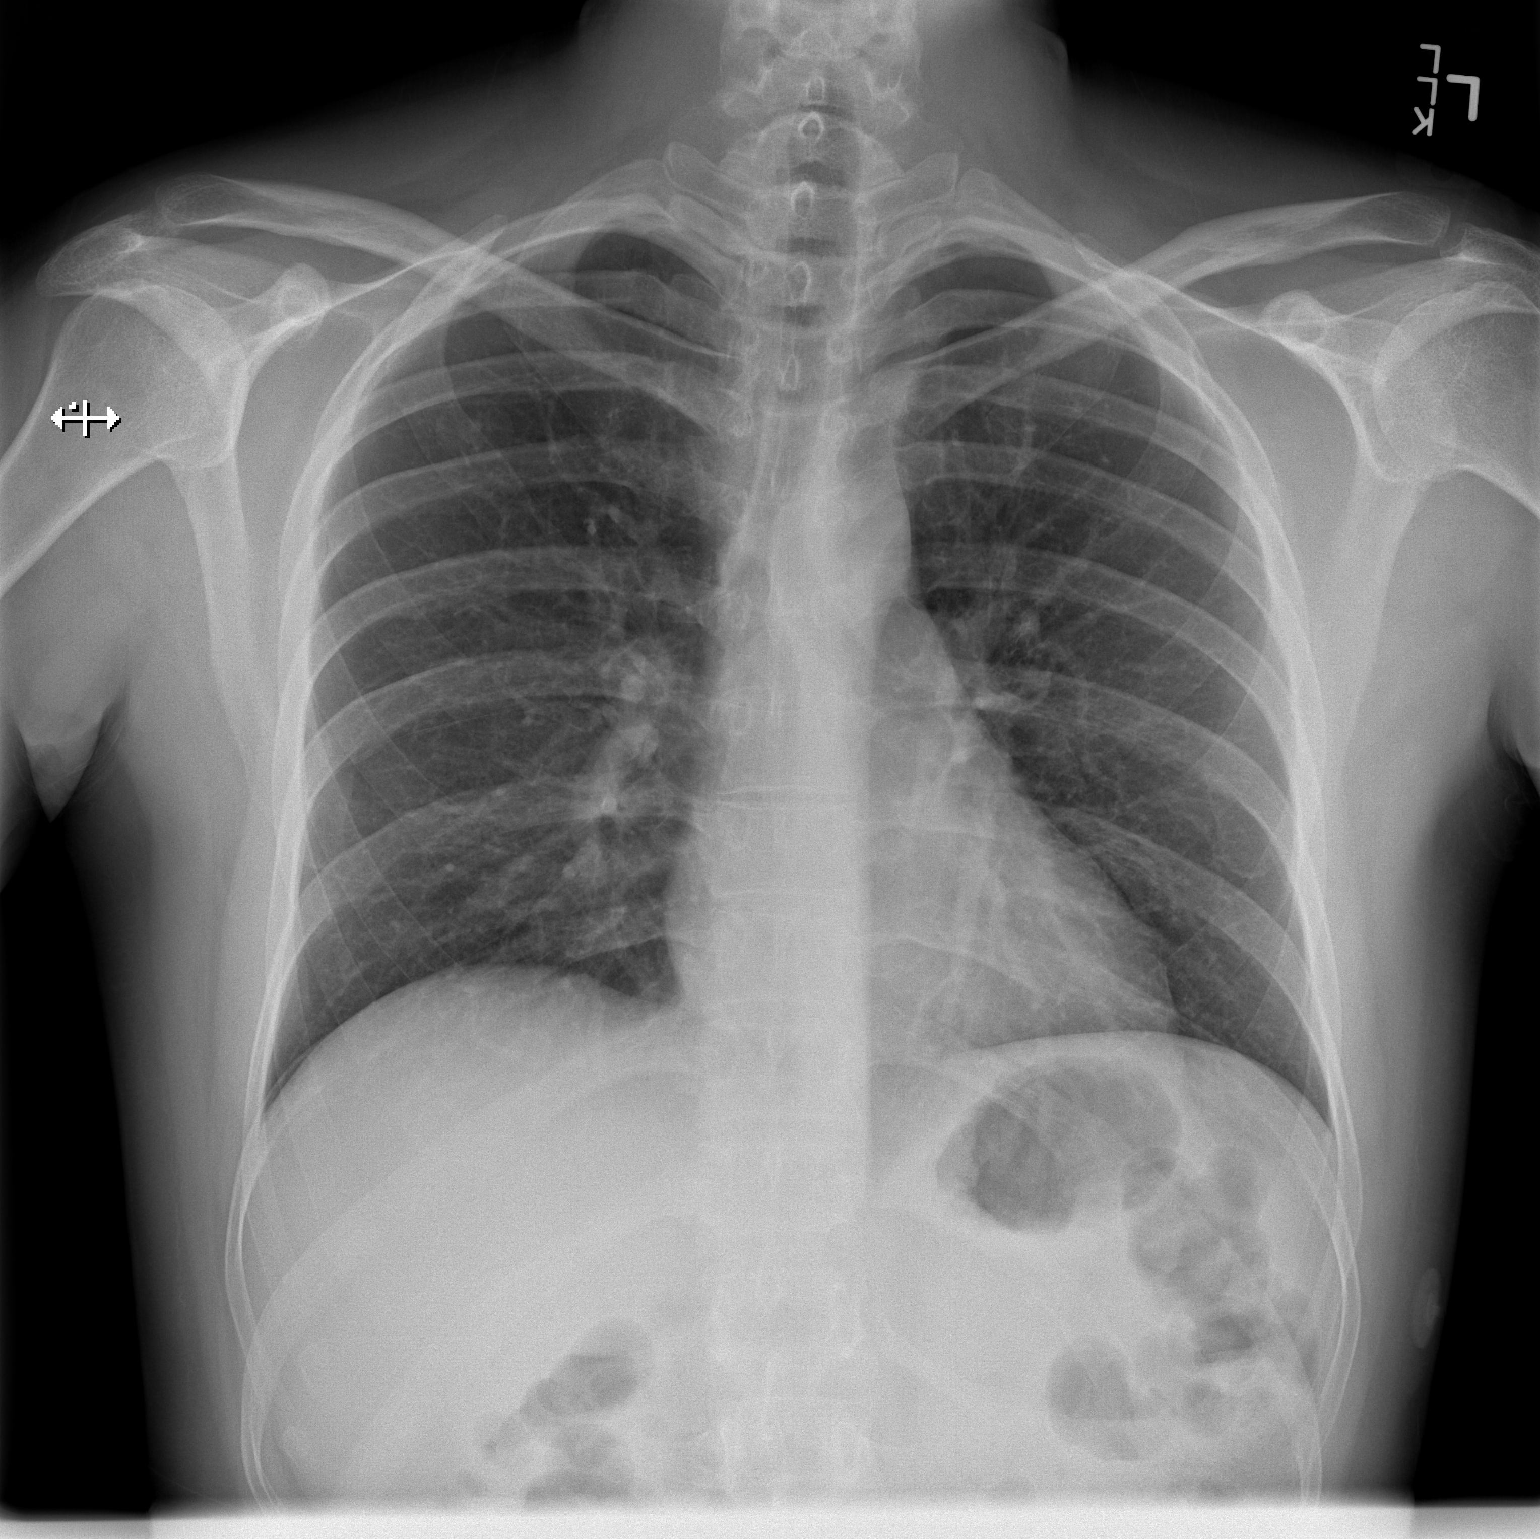

[w chest lat]
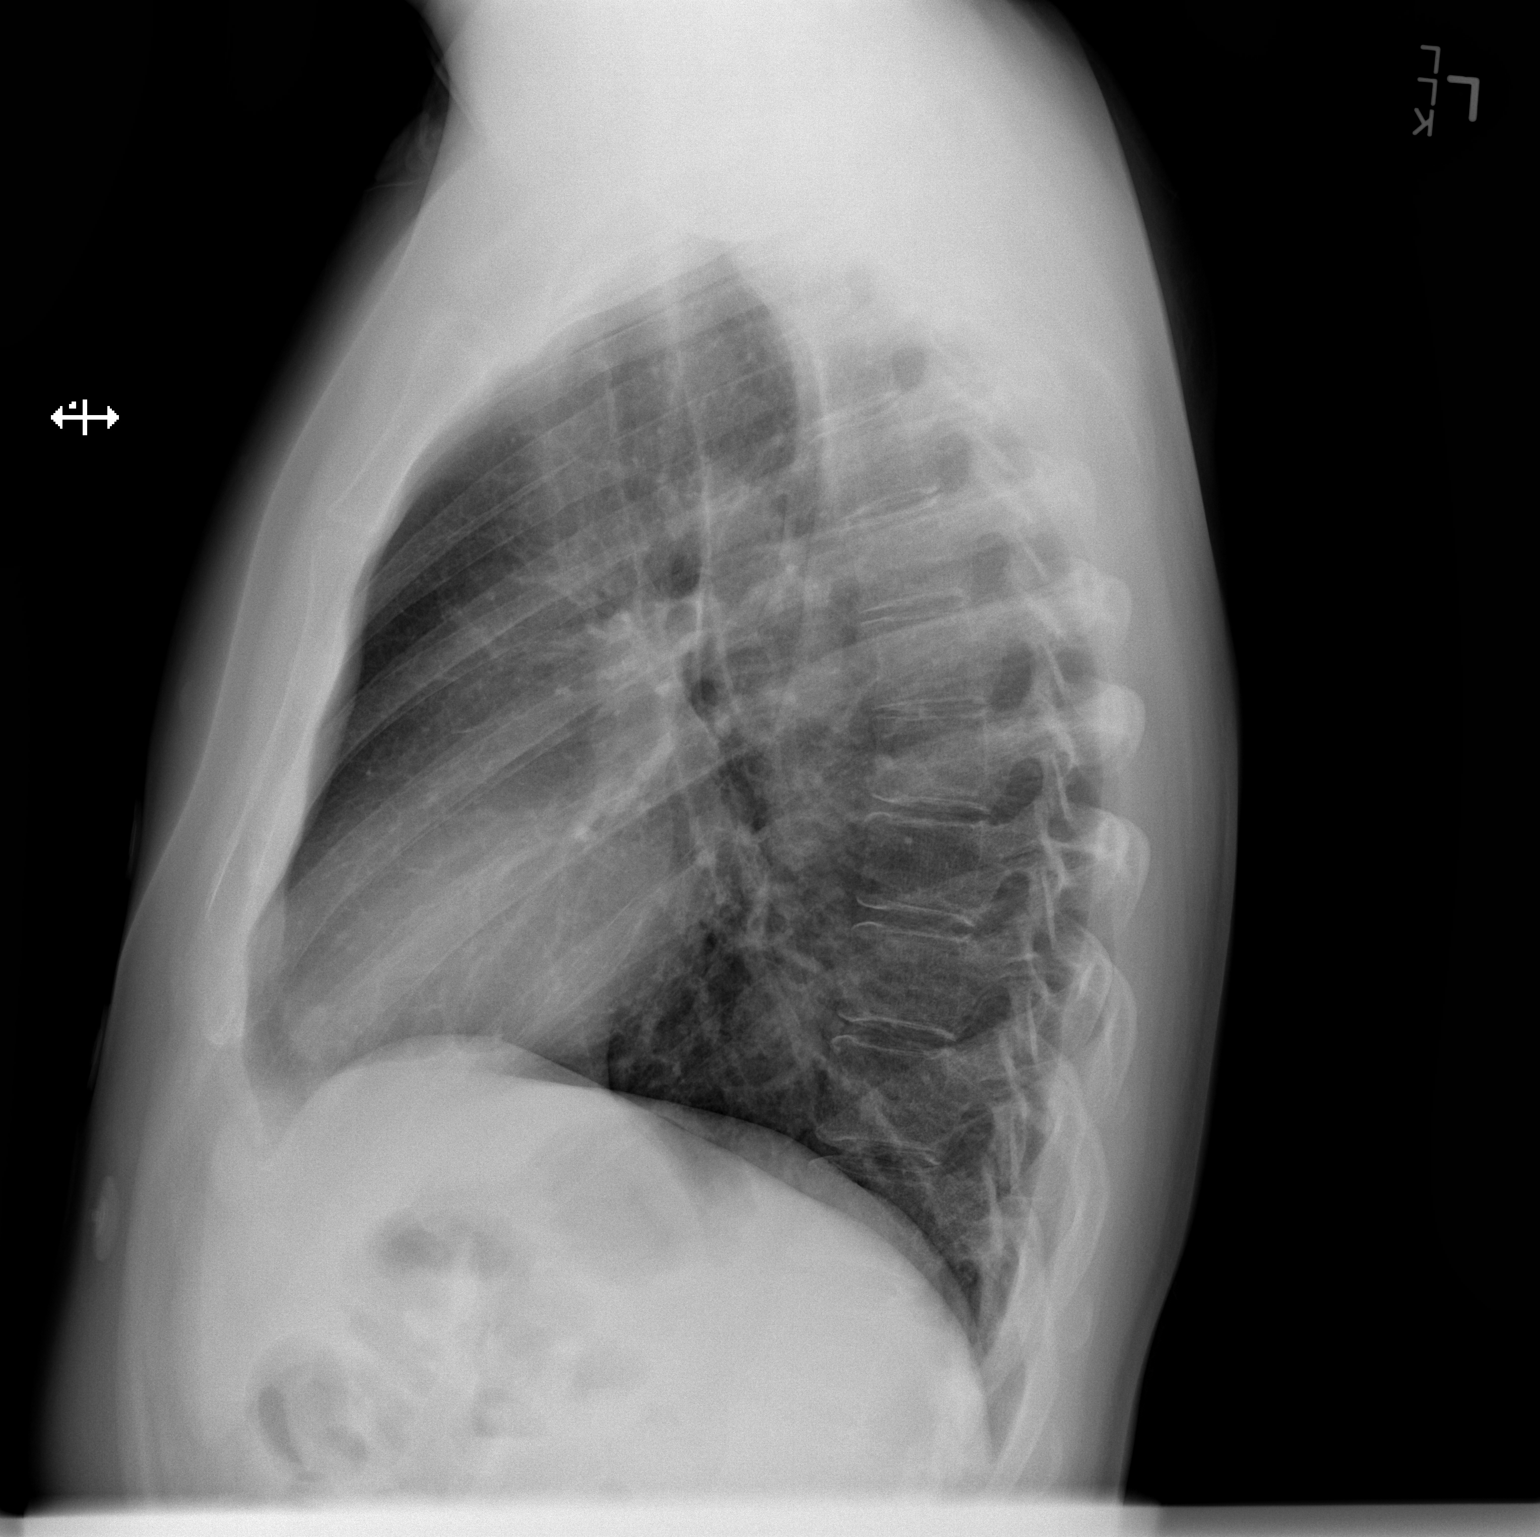

[2 of 2 positions shown; findings below may reference images not displayed]

FINDINGS: The heart size and mediastinal contours are within normal limits.
Both lungs are clear. The visualized skeletal structures are
unremarkable.
IMPRESSION: No active cardiopulmonary disease.

## 2022-11-30 DIAGNOSIS — F1120 Opioid dependence, uncomplicated: Secondary | ICD-10-CM | POA: Diagnosis not present

## 2022-12-07 DIAGNOSIS — F1120 Opioid dependence, uncomplicated: Secondary | ICD-10-CM | POA: Diagnosis not present

## 2022-12-13 ENCOUNTER — Other Ambulatory Visit: Payer: Self-pay

## 2022-12-13 ENCOUNTER — Encounter (HOSPITAL_COMMUNITY): Payer: Self-pay

## 2022-12-13 ENCOUNTER — Emergency Department (HOSPITAL_COMMUNITY)
Admission: EM | Admit: 2022-12-13 | Discharge: 2022-12-13 | Discharge status: Against Medical Advice | Payer: 59 | Source: Home / Self Care | Attending: Emergency Medicine | Admitting: Emergency Medicine

## 2022-12-13 ENCOUNTER — Emergency Department (HOSPITAL_COMMUNITY): Payer: 59

## 2022-12-13 DIAGNOSIS — F10939 Alcohol use, unspecified with withdrawal, unspecified: Secondary | ICD-10-CM | POA: Diagnosis not present

## 2022-12-13 DIAGNOSIS — F10239 Alcohol dependence with withdrawal, unspecified: Secondary | ICD-10-CM | POA: Insufficient documentation

## 2022-12-13 DIAGNOSIS — R112 Nausea with vomiting, unspecified: Secondary | ICD-10-CM | POA: Diagnosis not present

## 2022-12-13 DIAGNOSIS — K920 Hematemesis: Secondary | ICD-10-CM | POA: Insufficient documentation

## 2022-12-13 DIAGNOSIS — Z5329 Procedure and treatment not carried out because of patient's decision for other reasons: Secondary | ICD-10-CM | POA: Insufficient documentation

## 2022-12-13 DIAGNOSIS — R9431 Abnormal electrocardiogram [ECG] [EKG]: Secondary | ICD-10-CM | POA: Diagnosis not present

## 2022-12-13 DIAGNOSIS — F10930 Alcohol use, unspecified with withdrawal, uncomplicated: Secondary | ICD-10-CM

## 2022-12-13 DIAGNOSIS — F10230 Alcohol dependence with withdrawal, uncomplicated: Secondary | ICD-10-CM | POA: Diagnosis not present

## 2022-12-13 LAB — CBC
HCT: 42.8 % (ref 39.0–52.0)
Hemoglobin: 14.7 g/dL (ref 13.0–17.0)
MCH: 34.3 pg — ABNORMAL HIGH (ref 26.0–34.0)
MCHC: 34.3 g/dL (ref 30.0–36.0)
MCV: 100 fL (ref 80.0–100.0)
Platelets: 235 10*3/uL (ref 150–400)
RBC: 4.28 MIL/uL (ref 4.22–5.81)
RDW: 12.3 % (ref 11.5–15.5)
WBC: 6.9 10*3/uL (ref 4.0–10.5)
nRBC: 0 % (ref 0.0–0.2)

## 2022-12-13 LAB — LIPASE, BLOOD: Lipase: 48 U/L (ref 11–51)

## 2022-12-13 LAB — TYPE AND SCREEN
ABO/RH(D): B POS
Antibody Screen: NEGATIVE

## 2022-12-13 LAB — URINALYSIS, ROUTINE W REFLEX MICROSCOPIC
Bilirubin Urine: NEGATIVE
Glucose, UA: NEGATIVE mg/dL
Hgb urine dipstick: NEGATIVE
Ketones, ur: NEGATIVE mg/dL
Leukocytes,Ua: NEGATIVE
Nitrite: NEGATIVE
Protein, ur: NEGATIVE mg/dL
Specific Gravity, Urine: 1.021 (ref 1.005–1.030)
pH: 5 (ref 5.0–8.0)

## 2022-12-13 LAB — RAPID URINE DRUG SCREEN, HOSP PERFORMED
Amphetamines: NOT DETECTED
Barbiturates: NOT DETECTED
Benzodiazepines: NOT DETECTED
Cocaine: NOT DETECTED
Opiates: NOT DETECTED
Tetrahydrocannabinol: NOT DETECTED

## 2022-12-13 LAB — ETHANOL: Alcohol, Ethyl (B): <10 mg/dL (ref <10)

## 2022-12-13 LAB — COMPREHENSIVE METABOLIC PANEL
ALT: 57 U/L — ABNORMAL HIGH (ref 0–44)
AST: 101 U/L — ABNORMAL HIGH (ref 15–41)
Albumin: 4.1 g/dL (ref 3.5–5.0)
Alkaline Phosphatase: 93 U/L (ref 38–126)
Anion gap: 12 (ref 5–15)
BUN: 13 mg/dL (ref 6–20)
CO2: 23 mmol/L (ref 22–32)
Calcium: 8.8 mg/dL — ABNORMAL LOW (ref 8.9–10.3)
Chloride: 102 mmol/L (ref 98–111)
Creatinine, Ser: 0.88 mg/dL (ref 0.61–1.24)
GFR, Estimated: >60 mL/min (ref >60)
Glucose, Bld: 121 mg/dL — ABNORMAL HIGH (ref 70–99)
Potassium: 3.8 mmol/L (ref 3.5–5.1)
Sodium: 137 mmol/L (ref 135–145)
Total Bilirubin: 0.8 mg/dL (ref 0.3–1.2)
Total Protein: 7.9 g/dL (ref 6.5–8.1)

## 2022-12-13 LAB — POC OCCULT BLOOD, ED: Fecal Occult Bld: NEGATIVE

## 2022-12-13 MED ORDER — ONDANSETRON HCL 4 MG/2ML IJ SOLN
4.0000 mg | Freq: Once | INTRAMUSCULAR | Status: AC
  Administered 2022-12-13: 4 mg via INTRAVENOUS
  Filled 2022-12-13: qty 2

## 2022-12-13 MED ORDER — LORAZEPAM 2 MG/ML IJ SOLN
1.0000 mg | Freq: Once | INTRAMUSCULAR | Status: AC
  Administered 2022-12-13: 1 mg via INTRAVENOUS
  Filled 2022-12-13: qty 1

## 2022-12-13 MED ORDER — PANTOPRAZOLE 80MG IVPB - SIMPLE MED
80.0000 mg | Freq: Once | INTRAVENOUS | Status: AC
  Administered 2022-12-13: 80 mg via INTRAVENOUS
  Filled 2022-12-13: qty 100

## 2022-12-13 MED ORDER — IOHEXOL 350 MG/ML SOLN
75.0000 mL | Freq: Once | INTRAVENOUS | Status: AC | PRN
  Administered 2022-12-13: 75 mL via INTRAVENOUS

## 2022-12-13 MED ORDER — LORAZEPAM 2 MG/ML IJ SOLN
2.0000 mg | Freq: Once | INTRAMUSCULAR | Status: AC
  Administered 2022-12-13: 2 mg via INTRAVENOUS
  Filled 2022-12-13: qty 1

## 2022-12-13 NOTE — ED Notes (Signed)
Pt was found by staff walking out the door. Pt had an IV and was removed by staff. Pt states he is feeling better and wants to leave. RN attempted to stop pt but pt walked out of the ED with a friend. IV was removed at this time. Will notify MD.

## 2022-12-13 NOTE — ED Notes (Addendum)
Pt reports ongoing abdominal pain with nausea, vomiting for a few months that has worsened recently. Now reports blood in vomit. Reports about 10 vomiting episodes in the last 24 hrs. Pt states he drinks half a gallon of liquor daily for 8 years. Currently appears anxious and observed with tremors. MD is aware. Alert and oriented x 4. Last alcoholic drink was 2 beers (16 oz) last night.

## 2022-12-13 NOTE — ED Notes (Signed)
Pt was stuck twice. Wasn't successful.

## 2022-12-13 NOTE — ED Provider Notes (Signed)
[place] EMERGENCY DEPARTMENT AT [facility] Provider Note   CSN: [id number] Arrival date & time: 12/13/22  1029     History  Chief Complaint  Patient presents with   Abdominal Pain   Nausea   Emesis    Mason Jones is a 37 y.o. male.  37 year old male with prior medical history as detailed below presents for evaluation.  Patient reports chronic abdominal pain.  Patient reports daily alcohol intake -he reports at least 1 gallon of hard liquor consumed daily.  Last EtOH intake was yesterday.  Patient reports increased abdominal pain times approximately 24 to 48 hours.  He reports multiple episodes of emesis over the last 24 hours.  He reports seeing blood in his emesis.  He denies vomiting of large volumes of blood.  He denies blood in his stool.  He denies prior history of bloody emesis.  He denies prior history of GI bleed.  Patient is visibly tremulous and shaky.  He reports history of alcohol withdrawal in the past.  He denies prior history of seizure.  He denies current or recent IV drug abuse.  He is currently on methadone therapy for history of narcotic addiction.  He is without prior GI workup or PCP.  The history is provided by the patient and medical records.       Home Medications Prior to Admission medications   Medication Sig Start Date End Date Taking? Authorizing Provider  azithromycin (ZITHROMAX) 250 MG tablet Take 1 tablet (250 mg total) by mouth daily. Take first 2 tablets together, then 1 every day until finished. Patient not taking: Reported on 05/22/2019 09/16/18   Mesner, Barbara Cower, MD  benzonatate (TESSALON) 100 MG capsule Take 1 capsule (100 mg total) by mouth 3 (three) times daily as needed for cough. Patient not taking: Reported on 05/22/2019 09/16/18   Mesner, Barbara Cower, MD  famotidine (PEPCID) 20 MG tablet Take 1 tablet (20 mg total) by mouth 2 (two) times daily for 7 days. Patient not taking: Reported on 05/22/2019 10/20/18 10/27/18  Couture, Cortni S,  PA-C  methadone (DOLOPHINE) 10 MG/5ML solution Take 125 mg by mouth daily.     [provider]  metoCLOPramide (REGLAN) 10 MG tablet Take 1 tablet (10 mg total) by mouth every 6 (six) hours as needed for nausea (nausea/headache). 05/22/19   Harris, Abigail, PA-C  ondansetron (ZOFRAN) 4 MG tablet Take 1 tablet (4 mg total) by mouth every 8 (eight) hours as needed for nausea or vomiting. Patient not taking: Reported on 05/22/2019 10/20/18   Couture, Cortni S, PA-C  polyethylene glycol (MIRALAX) 17 g packet Take 17 g by mouth daily. Dissolve one cap full in solution (water, gatorade, etc.) and administer once cap-full daily. You may titrate up daily by 1 cap-full until the patient is having pudding consistency of stools. After the patient is able to start passing softer stools they will need to be on 1/2 cap-full daily for 2 weeks. Patient not taking: Reported on 05/22/2019 10/20/18   Couture, Cortni S, PA-C      Allergies    Patient has no known allergies.    Review of Systems   Review of Systems  All other systems reviewed and are negative.   Physical Exam Updated Vital Signs BP (!) 176/95   Pulse 71   Temp 98.8 F (37.1 C) (Oral)   Resp 20   Ht 5\' 10"  (1.778 m)   Wt 74.8 kg   SpO2 98%   BMI 23.68 kg/m  Physical  Exam Vitals and nursing note reviewed.  Constitutional:      General: He is not in acute distress.    Appearance: Normal appearance. He is well-developed.     Comments: Alert, tremulous  HENT:     Head: Normocephalic and atraumatic.  Eyes:     Conjunctiva/sclera: Conjunctivae normal.     Pupils: Pupils are equal, round, and reactive to light.  Cardiovascular:     Rate and Rhythm: Normal rate and regular rhythm.     Heart sounds: Normal heart sounds.  Pulmonary:     Effort: Pulmonary effort is normal. No respiratory distress.     Breath sounds: Normal breath sounds.  Abdominal:     General: There is no distension.     Palpations: Abdomen is soft.      Tenderness: There is no abdominal tenderness.  Genitourinary:    Comments: DRE without evidence of melena.  No gross blood on exam. Musculoskeletal:        General: No deformity. Normal range of motion.     Cervical back: Normal range of motion and neck supple.  Skin:    General: Skin is warm and dry.  Neurological:     General: No focal deficit present.     Mental Status: He is alert and oriented to person, place, and time.     ED Results / Procedures / Treatments   Labs (all labs ordered are listed, but only abnormal results are displayed) Labs Reviewed  LIPASE, BLOOD  COMPREHENSIVE METABOLIC PANEL  CBC  URINALYSIS, ROUTINE W REFLEX MICROSCOPIC  RAPID URINE DRUG SCREEN, HOSP PERFORMED  ETHANOL  TYPE AND SCREEN    EKG None  Radiology No results found.  Procedures Procedures    Medications Ordered in ED Medications  LORazepam (ATIVAN) injection 1 mg (has no administration in time range)  pantoprazole (PROTONIX) 80 mg /NS 100 mL IVPB (has no administration in time range)    ED Course/ Medical Decision Making/ A&P                                 Medical Decision Making Amount and/or Complexity of Data Reviewed Labs: ordered. Radiology: ordered.  Risk Prescription drug management.    Medical Screen Complete  This patient presented to the ED with complaint of vomiting, tremor.  This complaint involves an extensive number of treatment options. The initial differential diagnosis includes, but is not limited to, ETOH withdrawal, metabolic abnormality, GI bleed, etc  This presentation is: Acute, Chronic, Self-Limited, Previously Undiagnosed, Uncertain Prognosis, Complicated, Systemic Symptoms, and Threat to Life/Bodily Function  Patient with heavy daily alcohol use reported. Patient presents with approximately 24 hours of significant vomiting. He reports bloody emesis. He denies prior G.I. work up. His last alcohol intake was approximately 24 hours  ago.  Patient with evidence of alcohol withdrawal on evaluation.  Symptoms improved with administration of benzodiazepines. Patient's initial labs are reassuringly normal. However, patient would benefit from admission for treatment of alcohol withdrawal and workup of suspected upper G.I. bleeding.  Both G.I. service and hospitalist service made aware of admission. However, the patient decided to leave Against Medical Advice. Patient had capacity to leave and decline care.   Patient left prior to this provider being informed by staff. I was unable to have a conversation to try to convince the patient to stay.  Additional history obtained:  External records from outside sources obtained and reviewed including prior ED  visits and prior Inpatient records.    Lab Tests:  I ordered and personally interpreted labs.  The pertinent results include:  cbc cmp ua etoh urine tox screen   Imaging Studies ordered:  I ordered imaging studies including ct ap  I independently visualized and interpreted obtained imaging which showed nad I agree with the radiologist interpretation.   Cardiac Monitoring:  The patient was maintained on a cardiac monitor.  I personally viewed and interpreted the cardiac monitor which showed an underlying rhythm of: nsr   Medicines ordered:  I ordered medication including ativan  for withdrawal  Reevaluation of the patient after these medicines showed that the patient: improved   Problem List / ED Course:  Etoh withdrawal, suspected GI bleeding   Reevaluation:  After the interventions noted above, I reevaluated the patient and found that they have: improved   Disposition:  After consideration of the diagnostic results and the patients response to treatment, I feel that the patent would benefit from admission.   CRITICAL CARE Performed by: Wynetta Fines   Total critical care time: 30 minutes  Critical care time was exclusive of separately billable  procedures and treating other patients.  Critical care was necessary to treat or prevent imminent or life-threatening deterioration.  Critical care was time spent personally by me on the following activities: development of treatment plan with patient and/or surrogate as well as nursing, discussions with consultants, evaluation of patient's response to treatment, examination of patient, obtaining history from patient or surrogate, ordering and performing treatments and interventions, ordering and review of laboratory studies, ordering and review of radiographic studies, pulse oximetry and re-evaluation of patient's condition.          Final Clinical Impression(s) / ED Diagnoses Final diagnoses:  Nausea and vomiting, unspecified vomiting type  Alcohol withdrawal syndrome without complication (HCC)  Hematemesis with nausea    Rx / DC Orders ED Discharge Orders     None         Wynetta Fines, MD 12/13/22 2030

## 2022-12-13 NOTE — ED Triage Notes (Signed)
Pt came in via POV d/t abd pain & n/v for the last few months, rates his pain 4/10, A/Ox4. Denies recent hematemesis but endorses that it has happened before.

## 2022-12-16 ENCOUNTER — Encounter (HOSPITAL_COMMUNITY): Payer: Self-pay

## 2022-12-16 ENCOUNTER — Inpatient Hospital Stay (HOSPITAL_COMMUNITY)
Admission: EM | Admit: 2022-12-16 | Discharge: 2022-12-17 | DRG: 897 | Disposition: A | Discharge status: Home / Self Care | Payer: 59 | Attending: Internal Medicine | Admitting: Internal Medicine

## 2022-12-16 ENCOUNTER — Other Ambulatory Visit: Payer: Self-pay

## 2022-12-16 DIAGNOSIS — R7401 Elevation of levels of liver transaminase levels: Secondary | ICD-10-CM | POA: Diagnosis present

## 2022-12-16 DIAGNOSIS — Z560 Unemployment, unspecified: Secondary | ICD-10-CM | POA: Diagnosis not present

## 2022-12-16 DIAGNOSIS — K5900 Constipation, unspecified: Secondary | ICD-10-CM | POA: Diagnosis present

## 2022-12-16 DIAGNOSIS — G8929 Other chronic pain: Secondary | ICD-10-CM | POA: Diagnosis not present

## 2022-12-16 DIAGNOSIS — K2900 Acute gastritis without bleeding: Secondary | ICD-10-CM | POA: Diagnosis not present

## 2022-12-16 DIAGNOSIS — F410 Panic disorder [episodic paroxysmal anxiety] without agoraphobia: Secondary | ICD-10-CM | POA: Diagnosis present

## 2022-12-16 DIAGNOSIS — Z801 Family history of malignant neoplasm of trachea, bronchus and lung: Secondary | ICD-10-CM | POA: Diagnosis not present

## 2022-12-16 DIAGNOSIS — R0602 Shortness of breath: Secondary | ICD-10-CM | POA: Diagnosis not present

## 2022-12-16 DIAGNOSIS — F10939 Alcohol use, unspecified with withdrawal, unspecified: Secondary | ICD-10-CM | POA: Diagnosis not present

## 2022-12-16 DIAGNOSIS — F32A Depression, unspecified: Secondary | ICD-10-CM | POA: Diagnosis not present

## 2022-12-16 DIAGNOSIS — K2920 Alcoholic gastritis without bleeding: Secondary | ICD-10-CM | POA: Diagnosis not present

## 2022-12-16 DIAGNOSIS — F4310 Post-traumatic stress disorder, unspecified: Secondary | ICD-10-CM | POA: Diagnosis present

## 2022-12-16 DIAGNOSIS — F1120 Opioid dependence, uncomplicated: Secondary | ICD-10-CM | POA: Diagnosis not present

## 2022-12-16 DIAGNOSIS — I440 Atrioventricular block, first degree: Secondary | ICD-10-CM | POA: Diagnosis not present

## 2022-12-16 DIAGNOSIS — F10930 Alcohol use, unspecified with withdrawal, uncomplicated: Principal | ICD-10-CM

## 2022-12-16 DIAGNOSIS — Z634 Disappearance and death of family member: Secondary | ICD-10-CM | POA: Diagnosis not present

## 2022-12-16 DIAGNOSIS — F10230 Alcohol dependence with withdrawal, uncomplicated: Principal | ICD-10-CM | POA: Diagnosis present

## 2022-12-16 DIAGNOSIS — I951 Orthostatic hypotension: Secondary | ICD-10-CM | POA: Diagnosis present

## 2022-12-16 DIAGNOSIS — Z79899 Other long term (current) drug therapy: Secondary | ICD-10-CM | POA: Diagnosis not present

## 2022-12-16 LAB — COMPREHENSIVE METABOLIC PANEL
ALT: 48 U/L — ABNORMAL HIGH (ref 0–44)
AST: 125 U/L — ABNORMAL HIGH (ref 15–41)
Albumin: 4.1 g/dL (ref 3.5–5.0)
Alkaline Phosphatase: 90 U/L (ref 38–126)
Anion gap: 12 (ref 5–15)
BUN: 11 mg/dL (ref 6–20)
CO2: 20 mmol/L — ABNORMAL LOW (ref 22–32)
Calcium: 9.1 mg/dL (ref 8.9–10.3)
Chloride: 106 mmol/L (ref 98–111)
Creatinine, Ser: 0.88 mg/dL (ref 0.61–1.24)
GFR, Estimated: >60 mL/min (ref >60)
Glucose, Bld: 103 mg/dL — ABNORMAL HIGH (ref 70–99)
Potassium: 4.3 mmol/L (ref 3.5–5.1)
Sodium: 138 mmol/L (ref 135–145)
Total Bilirubin: 1.3 mg/dL — ABNORMAL HIGH (ref 0.3–1.2)
Total Protein: 7.7 g/dL (ref 6.5–8.1)

## 2022-12-16 LAB — CBC
HCT: 41 % (ref 39.0–52.0)
Hemoglobin: 13.7 g/dL (ref 13.0–17.0)
MCH: 33.5 pg (ref 26.0–34.0)
MCHC: 33.4 g/dL (ref 30.0–36.0)
MCV: 100.2 fL — ABNORMAL HIGH (ref 80.0–100.0)
Platelets: 259 10*3/uL (ref 150–400)
RBC: 4.09 MIL/uL — ABNORMAL LOW (ref 4.22–5.81)
RDW: 12.7 % (ref 11.5–15.5)
WBC: 6.6 10*3/uL (ref 4.0–10.5)
nRBC: 0 % (ref 0.0–0.2)

## 2022-12-16 LAB — URINALYSIS, ROUTINE W REFLEX MICROSCOPIC
Bilirubin Urine: NEGATIVE
Glucose, UA: NEGATIVE mg/dL
Hgb urine dipstick: NEGATIVE
Ketones, ur: NEGATIVE mg/dL
Leukocytes,Ua: NEGATIVE
Nitrite: NEGATIVE
Protein, ur: NEGATIVE mg/dL
Specific Gravity, Urine: 1.017 (ref 1.005–1.030)
pH: 5 (ref 5.0–8.0)

## 2022-12-16 LAB — LIPASE, BLOOD: Lipase: 46 U/L (ref 11–51)

## 2022-12-16 LAB — ETHANOL: Alcohol, Ethyl (B): <10 mg/dL (ref <10)

## 2022-12-16 LAB — HIV ANTIBODY (ROUTINE TESTING W REFLEX): HIV Screen 4th Generation wRfx: NONREACTIVE

## 2022-12-16 MED ORDER — CHLORDIAZEPOXIDE HCL 25 MG PO CAPS: 25.0000 mg | ORAL_CAPSULE | Freq: Three times a day (TID) | ORAL | Status: DC

## 2022-12-16 MED ORDER — PANTOPRAZOLE SODIUM 40 MG IV SOLR
40.0000 mg | Freq: Once | INTRAVENOUS | Status: AC
  Administered 2022-12-16: 40 mg via INTRAVENOUS
  Filled 2022-12-16: qty 10

## 2022-12-16 MED ORDER — DIAZEPAM 5 MG/ML IJ SOLN
20.0000 mg | Freq: Once | INTRAMUSCULAR | Status: AC
  Administered 2022-12-16: 20 mg via INTRAVENOUS
  Filled 2022-12-16: qty 4

## 2022-12-16 MED ORDER — CHLORDIAZEPOXIDE HCL 25 MG PO CAPS: 25.0000 mg | ORAL_CAPSULE | Freq: Four times a day (QID) | ORAL | Status: DC | PRN

## 2022-12-16 MED ORDER — ALUM & MAG HYDROXIDE-SIMETH 200-200-20 MG/5ML PO SUSP
30.0000 mL | Freq: Once | ORAL | Status: AC
  Administered 2022-12-16: 30 mL via ORAL
  Filled 2022-12-16: qty 30

## 2022-12-16 MED ORDER — DIAZEPAM 5 MG/ML IJ SOLN
10.0000 mg | Freq: Once | INTRAMUSCULAR | Status: AC
  Administered 2022-12-16: 10 mg via INTRAVENOUS
  Filled 2022-12-16: qty 2

## 2022-12-16 MED ORDER — RIVAROXABAN 10 MG PO TABS
10.0000 mg | ORAL_TABLET | Freq: Every day | ORAL | Status: DC
  Administered 2022-12-16 – 2022-12-17 (×2): 10 mg via ORAL
  Filled 2022-12-16 (×2): qty 1

## 2022-12-16 MED ORDER — LOPERAMIDE HCL 2 MG PO CAPS: 2.0000 mg | ORAL_CAPSULE | ORAL | Status: DC | PRN

## 2022-12-16 MED ORDER — PANTOPRAZOLE SODIUM 40 MG IV SOLR
40.0000 mg | Freq: Two times a day (BID) | INTRAVENOUS | Status: DC
  Administered 2022-12-16 – 2022-12-17 (×2): 40 mg via INTRAVENOUS
  Filled 2022-12-16 (×2): qty 10

## 2022-12-16 MED ORDER — HYDROXYZINE HCL 25 MG PO TABS: 25.0000 mg | ORAL_TABLET | Freq: Four times a day (QID) | ORAL | Status: DC | PRN

## 2022-12-16 MED ORDER — FOLIC ACID 1 MG PO TABS
1.0000 mg | ORAL_TABLET | Freq: Every day | ORAL | Status: DC
  Administered 2022-12-16 – 2022-12-17 (×2): 1 mg via ORAL
  Filled 2022-12-16 (×2): qty 1

## 2022-12-16 MED ORDER — CHLORDIAZEPOXIDE HCL 25 MG PO CAPS: 25.0000 mg | ORAL_CAPSULE | ORAL | Status: DC

## 2022-12-16 MED ORDER — ONDANSETRON 4 MG PO TBDP: 4.0000 mg | ORAL_TABLET | Freq: Four times a day (QID) | ORAL | Status: DC | PRN

## 2022-12-16 MED ORDER — THIAMINE MONONITRATE 100 MG PO TABS
100.0000 mg | ORAL_TABLET | Freq: Every day | ORAL | Status: DC
  Administered 2022-12-17: 100 mg via ORAL
  Filled 2022-12-16: qty 1

## 2022-12-16 MED ORDER — ONDANSETRON 4 MG PO TBDP
4.0000 mg | ORAL_TABLET | Freq: Four times a day (QID) | ORAL | Status: DC | PRN
  Administered 2022-12-17: 4 mg via ORAL
  Filled 2022-12-16: qty 1

## 2022-12-16 MED ORDER — METHADONE HCL 10 MG PO TABS
115.0000 mg | ORAL_TABLET | Freq: Every day | ORAL | Status: DC
  Administered 2022-12-17: 115 mg via ORAL
  Filled 2022-12-16: qty 12

## 2022-12-16 MED ORDER — CHLORDIAZEPOXIDE HCL 25 MG PO CAPS: 25.0000 mg | ORAL_CAPSULE | Freq: Once | ORAL | Status: DC

## 2022-12-16 MED ORDER — CHLORDIAZEPOXIDE HCL 25 MG PO CAPS: 25.0000 mg | ORAL_CAPSULE | Freq: Every day | ORAL | Status: DC

## 2022-12-16 MED ORDER — THIAMINE HCL 100 MG/ML IJ SOLN
100.0000 mg | Freq: Every day | INTRAMUSCULAR | Status: DC
  Administered 2022-12-16: 100 mg via INTRAVENOUS
  Filled 2022-12-16: qty 2

## 2022-12-16 MED ORDER — NICOTINE 21 MG/24HR TD PT24
21.0000 mg | MEDICATED_PATCH | Freq: Every day | TRANSDERMAL | Status: DC
  Administered 2022-12-16 – 2022-12-17 (×2): 21 mg via TRANSDERMAL
  Filled 2022-12-16 (×2): qty 1

## 2022-12-16 MED ORDER — ADULT MULTIVITAMIN W/MINERALS CH
1.0000 | ORAL_TABLET | Freq: Every day | ORAL | Status: DC
  Administered 2022-12-16 – 2022-12-17 (×2): 1 via ORAL
  Filled 2022-12-16 (×2): qty 1

## 2022-12-16 MED ORDER — FAMOTIDINE IN NACL 20-0.9 MG/50ML-% IV SOLN
20.0000 mg | Freq: Once | INTRAVENOUS | Status: AC
  Administered 2022-12-16: 20 mg via INTRAVENOUS
  Filled 2022-12-16: qty 50

## 2022-12-16 MED ORDER — CHLORDIAZEPOXIDE HCL 25 MG PO CAPS
25.0000 mg | ORAL_CAPSULE | Freq: Four times a day (QID) | ORAL | Status: DC
  Administered 2022-12-16 – 2022-12-17 (×4): 25 mg via ORAL
  Filled 2022-12-16 (×4): qty 1

## 2022-12-16 NOTE — H&P (Addendum)
Date: 12/16/2022               Patient Name:  Mason Jones MRN: [id number]  DOB: 1985/04/19 Age / Sex: 37 y.o., male   PCP: Patient, No Pcp Per         Medical Service: Internal Medicine Teaching Service         Attending Physician: Dr. Mercie Eon, MD      First Contact: Dr. Katheran Standley, DO Pager [phone]    Second Contact: Dr. [name], DO Pager [phone]         After Hours (After 5p/  First Contact Pager: [phone]  weekends / holidays): Second Contact Pager: [phone]   SUBJECTIVE   Chief Complaint: Abdominal pain, alcohol withdrawal  History of Present Illness:  Mason Mason Jones is a 37 yo M with PMH alcohol/polysubstance use disorder on methadone maintenance therapy who presented to [facility] ED by POV on 12/16/22 with complaint of abdominal pain. He reports frequent alcohol intake - approximately 1 gallon vodka every 1-2 days with most recent drink yesterday night and overall 8 year history of drinking. He reports one three week period of sobriety in his past that was ended upon the death of his mother.   His pain is primarily in the LUQ and present for 2+ months with slow progression. He reports emesis almost daily for many months, but it is never bloody. He does not have diarrhea. Vomiting has increased in recent weeks as has his level of alcohol consumption. His recent increase in drink was brought on by the departure of his family, including his 46 yo daughter. He also notes tremors that are typical when he goes without. He denies ever having a seizure. He does not that every few weeks he passes out upon standing, the most recent being two weeks ago.  Mason Jones is accompanied today by his neighbor who drove him to the ED and helps with transportation since pt lost his motorized scooter. 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  ED Course: Pt arrived to [facility] ED at 10am. Vitals: BP 163/117, otherwise WNL including pulse 76 Exam: irregular rhythm - paired beats, LUQ  abdominal tenderness, bilateral arm tremors  Labs: Ethanol <10, CBC unremarkable, BMP bicarb 20 and transaminitis AST 125 ALT 48, Lipase WNL, UA WNL Imaging: CXR and CT Ab unremarkable IMTS paged for admission of ethanol withdrawal  Past Medical History Past Medical History:  Diagnosis Date   Anxiety    Depression    Drug abuse (HCC)    PTSD (post-traumatic stress disorder)     Meds:  Methadone 115 every day   Past Surgical History No surgical history  Social:  Lives at his home. His partner and 6yo daughter recently left the home. Occupation: Former Musician, currently unemployed Support: Has some assistance from his neighbor who is present in ED Level of Function: indepenent in ADLs, require's neighbor's assistance with transportation PCP: Patient, No Pcp Per Substances: Vodka: 1 gallon per 2 days. Cigarettes: 1ppd x 6 years. None other currently. Former heroin use ended 8 years ago.  Family History:  Lung cancer in grandparents. Mother passed of lung disease.  Allergies:  Review of Systems: A complete ROS was negative except as per HPI.   OBJECTIVE:   Physical Exam: Blood pressure (!) 141/86, pulse 68, temperature 98.3 F (36.8 C), resp. rate 13, height 5\' 10"  (1.778 m), weight 74.8 kg, SpO2 96%.  Constitutional:Ill appearing man in bed. In no acute  distress. HENT: Normocephalic, atraumatic,  Eyes: Sclera non-icteric, PERRL, EOM intact Neck:normal atraumatic, no neck masses, normal thyroid, no jvd Cardio:Regular rate and rhythm. No murmurs, rubs, or gallops. 2+ bilateral radial and dorsalis pedis  pulses. Pulm:Clear to auscultation bilaterally. Normal work of breathing on room air. Abdomen: Soft, tender in the LUQ, non-distended, no masses, positive bowel sounds. ZOX:WRUEAVWU for extremity edema. Tremors of the bilateral UEs. Skin:Warm and dry. Neuro:Alert and oriented x3. No focal deficit noted. Psych:Anxious, tearful.  Labs: CBC    Component  Value Date/Time   WBC 6.6 12/16/2022 1008   RBC 4.09 (L) 12/16/2022 1008   HGB 13.7 12/16/2022 1008   HCT 41.0 12/16/2022 1008   PLT 259 12/16/2022 1008   MCV 100.2 (H) 12/16/2022 1008   MCH 33.5 12/16/2022 1008   MCHC 33.4 12/16/2022 1008   RDW 12.7 12/16/2022 1008   LYMPHSABS 1.3 11/02/2013 1308   MONOABS 0.7 11/02/2013 1308   EOSABS 0.1 11/02/2013 1308   BASOSABS 0.0 11/02/2013 1308     CMP     Component Value Date/Time   NA 138 12/16/2022 1008   K 4.3 12/16/2022 1008   CL 106 12/16/2022 1008   CO2 20 (L) 12/16/2022 1008   GLUCOSE 103 (H) 12/16/2022 1008   BUN 11 12/16/2022 1008   CREATININE 0.88 12/16/2022 1008   CALCIUM 9.1 12/16/2022 1008   PROT 7.7 12/16/2022 1008   ALBUMIN 4.1 12/16/2022 1008   AST 125 (H) 12/16/2022 1008   ALT 48 (H) 12/16/2022 1008   ALKPHOS 90 12/16/2022 1008   BILITOT 1.3 (H) 12/16/2022 1008   GFRNONAA >60 12/16/2022 1008   GFRAA >60 05/21/2019 2226    Imaging: No results found.   EKG: Not performed at time of admission. Pt on telemetry which demonstrates sinus rhythm with ventricular bigeminy.  ASSESSMENT & PLAN:   Assessment & Plan by Problem: Principal Problem:   Alcohol withdrawal (HCC)   Mason Jones is a 37 y.o. male with pertinent PMH of alcohol/polysubstance use disorder on methadone maintenance therapy who presented with abdominal pain and is admitted for alcohol withdrawal.  Ethanol Withdrawal / Ethanol Use Disorder This is a patient with significant daily alcohol consumption - 1 gallon vodka every 1-2 days - with overall dependence dating to eight years. Last drink was 20 hours ago. Afternoon CIWA 17. He is lucid. He denies any history of seizures or recent hospitalizations for this. He is clearly anxious and tremulous at initial evaluation which he reports is similar to previous withdrawal. He is not tachycardic, though telemetry shows ventricular bigeminy.  - Librium protocol for ethanol withdrawal - Multivitamin, Folic  acid, and thiamine daily - Hydroxyzine per CIWA - CCM - Hepatitis panel pending - monitor CBC, Mg, Phos - Will reach out to social work for [employer] re recovery and sobriety  Abdominal Pain Nausea/Vomiting Pt with 3+ months of pain, N/V that coincides with increased alcohol intake. Favor alcoholic gastritis and will provide symptomatic treatment. Will continue to monitor and consider alternatives causes such as PUD throughout course. Importantly, laboratory and imaging workup are unremarkable- including CBC, BMP, lipase, CXR and CT Abdomen that were WNL. He does have transaminitis in setting of alcohol consumption. - Zofran 4mg  prn - Protonix 40mg  BID  Hx Intermittent Syncope Pt reports intermittent syncope when standing up, consistently an orthostatic pattern. The most recent event was two weeks ago and it happens about monthly. ECG with ventricular bigeminy, per above. 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 - Echo pending  Transaminitis Mild, AST > AST, concurrent with underlying alcohol use disorder. Pt is not jaundiced. CT abdomen did not reveal cirrhosis or other liver pathology. 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 Currently taking methadone 115 every day, confirmed by his clinic [facility], Dr. Allyne Gee. - Will continue methadone - HIV antibody screen  Tobacco Use - 1ppd x 6 years Nicotine patch, 21mg .  Diet: Normal VTE: DOAC Code: Full  Dispo: Admit patient to Observation with expected length of stay less than 2 midnights.  Signed: Katheran Kemonte, DO Internal Medicine Resident PGY-1  12/16/2022, 4:34 PM   Dr. Katheran Marcus, DO Pager [phone]

## 2022-12-16 NOTE — ED Triage Notes (Signed)
Pt c/o LUQ abdominal painx30mos. Pt denies N/V at this time.

## 2022-12-16 NOTE — ED Provider Notes (Signed)
[place] EMERGENCY DEPARTMENT AT [facility] Provider Note   CSN: [id number] Arrival date & time: 12/16/22  0941     History  Chief Complaint  Patient presents with   Abdominal Pain    Mason Jones is a 37 y.o. male.  HPI 37 year old male with a history of chronic alcohol abuse presents with abdominal pain.  His neighbor also helps contribute to the history in the room.  The patient states he has been abusing alcohol daily for years.  Last drink last night.  He has been having abdominal pain for a while but is worse over the last several days.  He was in the emergency department 2 days ago but left AMA.  At that time he was also in withdrawal but it was worse according to the neighbor.  The patient would like to quit alcohol.  He denies suicidal thoughts though is having depression.  He is also on methadone.  The left upper quadrant pain comes and goes and feels like a stinging sensation.  He has been having vomiting, last had vomiting yesterday.  Currently is constipated.  Has not seen any bloody or black stools but did notice some blood in his emesis several days ago.  He was also told by his methadone clinic that his heart rate was in the 30s last week.  It seemed to come back up.  He denies any syncope/near syncope.  Home Medications Prior to Admission medications   Medication Sig Start Date End Date Taking? Authorizing Provider  famotidine (PEPCID) 20 MG tablet Take 1 tablet (20 mg total) by mouth 2 (two) times daily for 7 days. Patient not taking: Reported on 05/22/2019 10/20/18 10/27/18  Couture, Cortni S, PA-C  methadone (DOLOPHINE) 10 MG/5ML solution Take 115 mLs by mouth daily.    [provider]      Allergies    Patient has no known allergies.    Review of Systems   Review of Systems  Respiratory:  Negative for shortness of breath.   Cardiovascular:  Negative for chest pain.  Gastrointestinal:  Positive for abdominal pain and vomiting.  Neurological:   Positive for tremors. Negative for light-headedness.    Physical Exam Updated Vital Signs BP (!) 141/86   Pulse 68   Temp 98.3 F (36.8 C)   Resp 13   Ht 5\' 10"  (1.778 m)   Wt 74.8 kg   SpO2 96%   BMI 23.66 kg/m  Physical Exam Vitals and nursing note reviewed.  Constitutional:      Appearance: He is well-developed.  HENT:     Head: Normocephalic and atraumatic.  Cardiovascular:     Rate and Rhythm: Normal rate. Rhythm irregular.     Heart sounds: Normal heart sounds.     Comments: Patient has paired beats, one normal, one PVC Pulmonary:     Effort: Pulmonary effort is normal.     Breath sounds: Normal breath sounds.  Abdominal:     Palpations: Abdomen is soft.     Tenderness: There is abdominal tenderness in the left upper quadrant.  Skin:    General: Skin is warm and dry.  Neurological:     Mental Status: He is alert.     Motor: Tremor (bilteral hand tremor at rest) present.     ED Results / Procedures / Treatments   Labs (all labs ordered are listed, but only abnormal results are displayed) Labs Reviewed  COMPREHENSIVE METABOLIC PANEL - Abnormal; Notable for the following components:  Result Value   CO2 20 (*)    Glucose, Bld 103 (*)    AST 125 (*)    ALT 48 (*)    Total Bilirubin 1.3 (*)    All other components within normal limits  CBC - Abnormal; Notable for the following components:   RBC 4.09 (*)    MCV 100.2 (*)    All other components within normal limits  LIPASE, BLOOD  URINALYSIS, ROUTINE W REFLEX MICROSCOPIC  ETHANOL  HIV ANTIBODY (ROUTINE TESTING W REFLEX)  HEPATITIS PANEL, ACUTE    EKG None  Radiology No results found.  Procedures .Critical Care  Performed by: Pricilla Loveless, MD Authorized by: Pricilla Loveless, MD   Critical care provider statement:    Critical care time (minutes):  35   Critical care time was exclusive of:  Separately billable procedures and treating other patients   Critical care was necessary to treat or  prevent imminent or life-threatening deterioration of the following conditions:  CNS failure or compromise   Critical care was time spent personally by me on the following activities:  Development of treatment plan with patient or surrogate, discussions with consultants, evaluation of patient's response to treatment, examination of patient, ordering and review of laboratory studies, ordering and review of radiographic studies, ordering and performing treatments and interventions, pulse oximetry, re-evaluation of patient's condition and review of old charts     Medications Ordered in ED Medications  rivaroxaban (XARELTO) tablet 10 mg (has no administration in time range)  nicotine (NICODERM CQ - dosed in mg/24 hours) patch 21 mg (21 mg Transdermal Patch Applied 12/16/22 1647)  multivitamin with minerals tablet 1 tablet (has no administration in time range)  chlordiazePOXIDE (LIBRIUM) capsule 25 mg (has no administration in time range)  hydrOXYzine (ATARAX) tablet 25 mg (has no administration in time range)  loperamide (IMODIUM) capsule 2-4 mg (has no administration in time range)  ondansetron (ZOFRAN-ODT) disintegrating tablet 4 mg (has no administration in time range)  thiamine (VITAMIN B1) tablet 100 mg (has no administration in time range)  diazepam (VALIUM) injection 10 mg (10 mg Intravenous Given 12/16/22 1406)  pantoprazole (PROTONIX) injection 40 mg (40 mg Intravenous Given 12/16/22 1403)  famotidine (PEPCID) IVPB 20 mg premix (0 mg Intravenous Stopped 12/16/22 1540)  alum & mag hydroxide-simeth (MAALOX/MYLANTA) 200-200-20 MG/5ML suspension 30 mL (30 mLs Oral Given 12/16/22 1411)  diazepam (VALIUM) injection 20 mg (20 mg Intravenous Given 12/16/22 1632)    ED Course/ Medical Decision Making/ A&P                                 Medical Decision Making Amount and/or Complexity of Data Reviewed Labs: ordered.    Details: Stable hemoglobin.  Mildly elevated LFTs consistent with chronic  alcohol abuse.  Risk OTC drugs. Prescription drug management. Decision regarding hospitalization.   Patient presents with alcoholic gastritis.  CT from a couple days ago was unremarkable and while he does have tenderness, his labs are not worsening and his vitals are otherwise unremarkable to the point I do not think a repeat will be helpful.  Will treat with Protonix, Pepcid, Maalox.  He is also in alcohol withdrawal.  His CIWA was 17.  He was given Valium but without much relief and so was given a second dose, 20 mg.  He will need to be observed closely as it seems like he is in withdrawal and he wants to stop drinking.  He will need admission and I discussed with the internal medicine teaching service.  No signs of altered mental status and he has not had any seizures.        Final Clinical Impression(s) / ED Diagnoses Final diagnoses:  Alcohol withdrawal syndrome without complication (HCC)  Acute alcoholic gastritis without hemorrhage    Rx / DC Orders ED Discharge Orders     None         Pricilla Loveless, MD 12/16/22 1700

## 2022-12-16 NOTE — ED Notes (Signed)
ED TO INPATIENT HANDOFF REPORT  ED Nurse Name and Phone #:  Corliss Blacker, RN [phone]  S Name/Age/Gender Mason Jones 37 y.o. male Room/Bed: 011C/011C  Code Status   Code Status: Full Code  Home/SNF/Other Home Patient oriented to: self, place, time, and situation Is this baseline? Yes   Triage Complete: Triage complete  Chief Complaint Alcohol withdrawal (HCC) [F10.939]  Triage Note Pt c/o LUQ abdominal painx28mos. Pt denies N/V at this time.   Allergies No Known Allergies  Level of Care/Admitting Diagnosis ED Disposition     ED Disposition  Admit   Condition  --   Comment  Hospital Area: [facility] [[id number]]  Level of Care: Telemetry Medical [104]  May admit patient to [facility] or [facility] if equivalent level of care is available:: Yes  Covid Evaluation: Asymptomatic - no recent exposure (last 10 days) testing not required  Diagnosis: Alcohol withdrawal (HCC) [291.81.ICD-9-CM]  Admitting Physician: Mercie Eon [[id number]]  Attending Physician: Mercie Eon [[id number]]  Certification:: I certify this patient will need inpatient services for at least 2 midnights  Expected Medical Readiness: 12/20/2022          B Medical/Surgery History Past Medical History:  Diagnosis Date   Anxiety    Depression    Drug abuse (HCC)    PTSD (post-traumatic stress disorder)    History reviewed. No pertinent surgical history.   A IV Location/Drains/Wounds Patient Lines/Drains/Airways Status     Active Line/Drains/Airways     Name Placement date Placement time Site Days   Peripheral IV 12/16/22 20 G Anterior;Distal;Left;Upper Arm 12/16/22  1403  Arm  less than 1            Intake/Output Last 24 hours No intake or output data in the 24 hours ending 12/16/22 1656  Labs/Imaging Results for orders placed or performed during the hospital encounter of 12/16/22 (from the past 48 hour(s))  Urinalysis, Routine w reflex microscopic -Urine, Clean Catch      Status: None   Collection Time: 12/16/22 10:03 AM  Result Value Ref Range   Color, Urine YELLOW YELLOW   APPearance CLEAR CLEAR   Specific Gravity, Urine 1.017 1.005 - 1.030   pH 5.0 5.0 - 8.0   Glucose, UA NEGATIVE NEGATIVE mg/dL   Hgb urine dipstick NEGATIVE NEGATIVE   Bilirubin Urine NEGATIVE NEGATIVE   Ketones, ur NEGATIVE NEGATIVE mg/dL   Protein, ur NEGATIVE NEGATIVE mg/dL   Nitrite NEGATIVE NEGATIVE   Leukocytes,Ua NEGATIVE NEGATIVE    Comment: Performed at [facility], [address], [place], [place] [address]  Lipase, blood     Status: None   Collection Time: 12/16/22 10:08 AM  Result Value Ref Range   Lipase 46 11 - 51 U/L    Comment: Performed at [facility], [address] [place], [place] [address]  Comprehensive metabolic panel     Status: Abnormal   Collection Time: 12/16/22 10:08 AM  Result Value Ref Range   Sodium 138 135 - 145 mmol/L   Potassium 4.3 3.5 - 5.1 mmol/L    Comment: HEMOLYSIS AT THIS LEVEL MAY AFFECT RESULT   Chloride 106 98 - 111 mmol/L   CO2 20 (L) 22 - 32 mmol/L   Glucose, Bld 103 (H) 70 - 99 mg/dL    Comment: Glucose reference range applies only to samples taken after fasting for at least 8 hours.   BUN 11 6 - 20 mg/dL   Creatinine, Ser 2.84 0.61 - 1.24  mg/dL   Calcium 9.1 8.9 - 16.1 mg/dL   Total Protein 7.7 6.5 - 8.1 g/dL   Albumin 4.1 3.5 - 5.0 g/dL   AST 096 (H) 15 - 41 U/L    Comment: HEMOLYSIS AT THIS LEVEL MAY AFFECT RESULT   ALT 48 (H) 0 - 44 U/L    Comment: HEMOLYSIS AT THIS LEVEL MAY AFFECT RESULT   Alkaline Phosphatase 90 38 - 126 U/L   Total Bilirubin 1.3 (H) 0.3 - 1.2 mg/dL    Comment: HEMOLYSIS AT THIS LEVEL MAY AFFECT RESULT   GFR, Estimated >60 >60 mL/min    Comment: (NOTE) Calculated using the CKD-EPI Creatinine Equation (2021)    Anion gap 12 5 - 15    Comment: Performed at [facility], [address] [place], [place] [address]  CBC     Status: Abnormal   Collection Time: 12/16/22 10:08 AM   Result Value Ref Range   WBC 6.6 4.0 - 10.5 K/uL   RBC 4.09 (L) 4.22 - 5.81 MIL/uL   Hemoglobin 13.7 13.0 - 17.0 g/dL   HCT 98.1 19.1 - 47.8 %   MCV 100.2 (H) 80.0 - 100.0 fL   MCH 33.5 26.0 - 34.0 pg   MCHC 33.4 30.0 - 36.0 g/dL   RDW 29.5 62.1 - 30.8 %   Platelets 259 150 - 400 K/uL   nRBC 0.0 0.0 - 0.2 %    Comment: Performed at [facility], [address] [place], [place] [address]  Ethanol     Status: None   Collection Time: 12/16/22  2:13 PM  Result Value Ref Range   Alcohol, Ethyl (B) <10 <10 mg/dL    Comment: (NOTE) Lowest detectable limit for serum alcohol is 10 mg/dL.  For medical purposes only. Performed at [facility], [address] [place], [place] [address]    No results found.  Pending Labs Unresulted Labs (From admission, onward)     Start     Ordered   12/16/22 1619  Hepatitis panel, acute  Once,   R        12/16/22 1618   12/16/22 1610  HIV Antibody (routine testing w rflx)  (HIV Antibody (Routine testing w reflex) panel)  Once,   R        12/16/22 1615            Vitals/Pain Today's Vitals   12/16/22 1254 12/16/22 1321 12/16/22 1404 12/16/22 1415  BP: (!) 158/66 (!) 163/117 (!) 163/117 (!) 141/86  Pulse: 71 76  68  Resp: 18 19  13   Temp: 98.4 F (36.9 C) 98.3 F (36.8 C)    TempSrc: Oral     SpO2: 100% 98%  96%  Weight:      Height:      PainSc:        Isolation Precautions No active isolations  Medications Medications  rivaroxaban (XARELTO) tablet 10 mg (has no administration in time range)  nicotine (NICODERM CQ - dosed in mg/24 hours) patch 21 mg (21 mg Transdermal Patch Applied 12/16/22 1647)  multivitamin with minerals tablet 1 tablet (has no administration in time range)  chlordiazePOXIDE (LIBRIUM) capsule 25 mg (has no administration in time range)  hydrOXYzine (ATARAX) tablet 25 mg (has no administration in time range)  loperamide (IMODIUM) capsule 2-4 mg (has no administration in time range)  ondansetron  (ZOFRAN-ODT) disintegrating tablet 4 mg (has no administration in time range)  thiamine (VITAMIN B1) tablet 100  mg (has no administration in time range)  diazepam (VALIUM) injection 10 mg (10 mg Intravenous Given 12/16/22 1406)  pantoprazole (PROTONIX) injection 40 mg (40 mg Intravenous Given 12/16/22 1403)  famotidine (PEPCID) IVPB 20 mg premix (0 mg Intravenous Stopped 12/16/22 1540)  alum & mag hydroxide-simeth (MAALOX/MYLANTA) 200-200-20 MG/5ML suspension 30 mL (30 mLs Oral Given 12/16/22 1411)  diazepam (VALIUM) injection 20 mg (20 mg Intravenous Given 12/16/22 1632)    Mobility walks     Focused Assessments Cardiac Assessment Handoff:    No results found for: "CKTOTAL", "CKMB", "CKMBINDEX", "TROPONINI" No results found for: "DDIMER" Does the Patient currently have chest pain? No    R Recommendations: See Admitting Provider Note  Report given to:   Additional Notes:

## 2022-12-16 NOTE — ED Notes (Signed)
ED TO INPATIENT HANDOFF REPORT  ED Nurse Name and Phone #: [phone] Cloyde Reams, RN  S Name/Age/Gender Mason Jones 37 y.o. male Room/Bed: 011C/011C  Code Status   Code Status: Full Code  Home/SNF/Other Home Patient oriented to: self, place, time, and situation Is this baseline? Yes   Triage Complete: Triage complete  Chief Complaint Alcohol withdrawal (HCC) [F10.939]  Triage Note Pt c/o LUQ abdominal painx31mos. Pt denies N/V at this time.   Allergies No Known Allergies  Level of Care/Admitting Diagnosis ED Disposition     ED Disposition  Admit   Condition  --   Comment  Hospital Area: [facility] [[id number]]  Level of Care: Telemetry Medical [104]  May admit patient to [facility] or [facility] if equivalent level of care is available:: Yes  Covid Evaluation: Asymptomatic - no recent exposure (last 10 days) testing not required  Diagnosis: Alcohol withdrawal (HCC) [291.81.ICD-9-CM]  Admitting Physician: Mercie Eon [[id number]]  Attending Physician: Mercie Eon [[id number]]  Certification:: I certify this patient will need inpatient services for at least 2 midnights  Expected Medical Readiness: 12/20/2022          B Medical/Surgery History Past Medical History:  Diagnosis Date   Anxiety    Depression    Drug abuse (HCC)    PTSD (post-traumatic stress disorder)    History reviewed. No pertinent surgical history.   A IV Location/Drains/Wounds Patient Lines/Drains/Airways Status     Active Line/Drains/Airways     Name Placement date Placement time Site Days   Peripheral IV 12/16/22 20 G Anterior;Distal;Left;Upper Arm 12/16/22  1403  Arm  less than 1            Intake/Output Last 24 hours No intake or output data in the 24 hours ending 12/16/22 1651  Labs/Imaging Results for orders placed or performed during the hospital encounter of 12/16/22 (from the past 48 hour(s))  Urinalysis, Routine w reflex microscopic -Urine, Clean Catch      Status: None   Collection Time: 12/16/22 10:03 AM  Result Value Ref Range   Color, Urine YELLOW YELLOW   APPearance CLEAR CLEAR   Specific Gravity, Urine 1.017 1.005 - 1.030   pH 5.0 5.0 - 8.0   Glucose, UA NEGATIVE NEGATIVE mg/dL   Hgb urine dipstick NEGATIVE NEGATIVE   Bilirubin Urine NEGATIVE NEGATIVE   Ketones, ur NEGATIVE NEGATIVE mg/dL   Protein, ur NEGATIVE NEGATIVE mg/dL   Nitrite NEGATIVE NEGATIVE   Leukocytes,Ua NEGATIVE NEGATIVE    Comment: Performed at [facility], [address] [place], [place] [address]  Lipase, blood     Status: None   Collection Time: 12/16/22 10:08 AM  Result Value Ref Range   Lipase 46 11 - 51 U/L    Comment: Performed at [facility], [address] [place], [place] [address]  Comprehensive metabolic panel     Status: Abnormal   Collection Time: 12/16/22 10:08 AM  Result Value Ref Range   Sodium 138 135 - 145 mmol/L   Potassium 4.3 3.5 - 5.1 mmol/L    Comment: HEMOLYSIS AT THIS LEVEL MAY AFFECT RESULT   Chloride 106 98 - 111 mmol/L   CO2 20 (L) 22 - 32 mmol/L   Glucose, Bld 103 (H) 70 - 99 mg/dL    Comment: Glucose reference range applies only to samples taken after fasting for at least 8 hours.   BUN 11 6 - 20 mg/dL   Creatinine, Ser 8.75 0.61 - 1.24  mg/dL   Calcium 9.1 8.9 - 24.4 mg/dL   Total Protein 7.7 6.5 - 8.1 g/dL   Albumin 4.1 3.5 - 5.0 g/dL   AST 010 (H) 15 - 41 U/L    Comment: HEMOLYSIS AT THIS LEVEL MAY AFFECT RESULT   ALT 48 (H) 0 - 44 U/L    Comment: HEMOLYSIS AT THIS LEVEL MAY AFFECT RESULT   Alkaline Phosphatase 90 38 - 126 U/L   Total Bilirubin 1.3 (H) 0.3 - 1.2 mg/dL    Comment: HEMOLYSIS AT THIS LEVEL MAY AFFECT RESULT   GFR, Estimated >60 >60 mL/min    Comment: (NOTE) Calculated using the CKD-EPI Creatinine Equation (2021)    Anion gap 12 5 - 15    Comment: Performed at [facility], [address] [place], [place] [address]  CBC     Status: Abnormal   Collection Time: 12/16/22 10:08 AM   Result Value Ref Range   WBC 6.6 4.0 - 10.5 K/uL   RBC 4.09 (L) 4.22 - 5.81 MIL/uL   Hemoglobin 13.7 13.0 - 17.0 g/dL   HCT 66.4 40.3 - 47.4 %   MCV 100.2 (H) 80.0 - 100.0 fL   MCH 33.5 26.0 - 34.0 pg   MCHC 33.4 30.0 - 36.0 g/dL   RDW 25.9 56.3 - 87.5 %   Platelets 259 150 - 400 K/uL   nRBC 0.0 0.0 - 0.2 %    Comment: Performed at [facility], [address], [place] [employer] [place], [place] [address]  Ethanol     Status: None   Collection Time: 12/16/22  2:13 PM  Result Value Ref Range   Alcohol, Ethyl (B) <10 <10 mg/dL    Comment: (NOTE) Lowest detectable limit for serum alcohol is 10 mg/dL.  For medical purposes only. Performed at [facility], [address], [place], [place] [address]    No results found.  Pending Labs Unresulted Labs (From admission, onward)     Start     Ordered   12/16/22 1619  Hepatitis panel, acute  Once,   R        12/16/22 1618   12/16/22 1610  HIV Antibody (routine testing w rflx)  (HIV Antibody (Routine testing w reflex) panel)  Once,   R        12/16/22 1615            Vitals/Pain Today's Vitals   12/16/22 1254 12/16/22 1321 12/16/22 1404 12/16/22 1415  BP: (!) 158/66 (!) 163/117 (!) 163/117 (!) 141/86  Pulse: 71 76  68  Resp: 18 19  13   Temp: 98.4 F (36.9 C) 98.3 F (36.8 C)    TempSrc: Oral     SpO2: 100% 98%  96%  Weight:      Height:      PainSc:        Isolation Precautions No active isolations  Medications Medications  rivaroxaban (XARELTO) tablet 10 mg (has no administration in time range)  nicotine (NICODERM CQ - dosed in mg/24 hours) patch 21 mg (21 mg Transdermal Patch Applied 12/16/22 1647)  multivitamin with minerals tablet 1 tablet (has no administration in time range)  chlordiazePOXIDE (LIBRIUM) capsule 25 mg (has no administration in time range)  hydrOXYzine (ATARAX) tablet 25 mg (has no administration in time range)  loperamide (IMODIUM) capsule 2-4 mg (has no administration in time range)  ondansetron  (ZOFRAN-ODT) disintegrating tablet 4 mg (has no administration in time range)  thiamine (VITAMIN B1) tablet 100  mg (has no administration in time range)  diazepam (VALIUM) injection 10 mg (10 mg Intravenous Given 12/16/22 1406)  pantoprazole (PROTONIX) injection 40 mg (40 mg Intravenous Given 12/16/22 1403)  famotidine (PEPCID) IVPB 20 mg premix (0 mg Intravenous Stopped 12/16/22 1540)  alum & mag hydroxide-simeth (MAALOX/MYLANTA) 200-200-20 MG/5ML suspension 30 mL (30 mLs Oral Given 12/16/22 1411)  diazepam (VALIUM) injection 20 mg (20 mg Intravenous Given 12/16/22 1632)    Mobility walks     Focused Assessments    R Recommendations: See Admitting Provider Note  Report given to:   Additional Notes: Call or epic message for any additional details

## 2022-12-16 NOTE — Hospital Course (Addendum)
Mason Jones is a 37 year old male with a past medical history of alcohol use disorder and opiate use disorder who presented with abdominal pain and desire to detox from alcohol.  For the past 8 years he has consumed about 2 L of vodka daily.  Prior to this he also had alcohol use disorder and went through a temporarily successful detox which lasted for about 3 weeks around 2016.  Unfortunately he had a death in the family and began to drink again at that time.  Since then he has not gone more than a day without a drink.  He has no known history of severe alcohol withdrawals with seizures.  On arrival he was bradycardic, tremulous, anxious, but alert and oriented and otherwise without significant abnormalities on exam.  Lab evaluation showed mild elevation in liver enzymes consistent with alcohol use.  There is no evidence of cirrhosis on exam, labs, or imaging.  CIWA scores in the ED were 17.  He was started on Librium taper and CIWA scores dropped to 11 and then 0 and stayed there until the next day.  His alcohol withdrawal was well-controlled on the Librium taper.  His reasoning for seeking alcohol detox was due to loss of relationship and contact with his child.  In the afternoon of the day after admission he expressed a desire to go home to be able to see his child who he had not seen in about 2 months.  He also discussed his long history of severe depression and panic attacks and likely PTSD.  As an adult he has had fear of crowds, even a group of more than 3 people, loud noises, and other such things that cause panic attacks.  He also has baseline anxiety and depression without mania.  For the past 8 years since he lost a loved one he has had worsening mood.  He denies current or prior suicidal ideation.  He has previously been on an SSRI and Wellbutrin that were related to his last alcohol detox.  These medicines were paired with 3 times weekly therapy.  He did not remember these working but was only on them  for a couple weeks before he resumed drinking alcohol.  He was amenable to starting an SSRI and getting close follow-up in our clinic.  Due to his stable alcohol withdrawal on Librium taper and plan for outpatient alcohol use disorder treatment as well as sound and consistent reasoning behind his decisions he was discharged with a extended Librium taper, started on sertraline, and with close follow-up in our clinic as well as strict return precautions.  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  Due to his significant alcohol use and cardiac conduction abnormalities we obtained an echo which showed no significant abnormalities.  Tentative plan for Zio patch outpatient and cardiology follow-up but this will be after he detoxes from alcohol.  We do not suspect cardiac syncope.  After oral rehydration and treatment of alcohol withdrawal his orthostatics were negative here.  Will follow-up in the clinic on any further syncope or presyncope.

## 2022-12-17 ENCOUNTER — Inpatient Hospital Stay (HOSPITAL_COMMUNITY): Payer: 59

## 2022-12-17 DIAGNOSIS — K2920 Alcoholic gastritis without bleeding: Secondary | ICD-10-CM

## 2022-12-17 DIAGNOSIS — F10939 Alcohol use, unspecified with withdrawal, unspecified: Secondary | ICD-10-CM | POA: Diagnosis not present

## 2022-12-17 DIAGNOSIS — R0602 Shortness of breath: Secondary | ICD-10-CM | POA: Diagnosis not present

## 2022-12-17 LAB — ECHOCARDIOGRAM COMPLETE
Area-P 1/2: 2.74 cm2
Calc EF: 61.3 %
Height: 70 in
S' Lateral: 3.25 cm
Single Plane A2C EF: 62.2 %
Single Plane A4C EF: 62.3 %
Weight: 2673.74 oz

## 2022-12-17 LAB — HEPATITIS PANEL, ACUTE
HCV Ab: REACTIVE — AB
Hep A IgM: NONREACTIVE
Hep B C IgM: NONREACTIVE
Hepatitis B Surface Ag: NONREACTIVE

## 2022-12-17 LAB — COMPREHENSIVE METABOLIC PANEL
ALT: 46 U/L — ABNORMAL HIGH (ref 0–44)
AST: 96 U/L — ABNORMAL HIGH (ref 15–41)
Albumin: 3.6 g/dL (ref 3.5–5.0)
Alkaline Phosphatase: 72 U/L (ref 38–126)
Anion gap: 10 (ref 5–15)
BUN: 8 mg/dL (ref 6–20)
CO2: 27 mmol/L (ref 22–32)
Calcium: 9 mg/dL (ref 8.9–10.3)
Chloride: 100 mmol/L (ref 98–111)
Creatinine, Ser: 0.97 mg/dL (ref 0.61–1.24)
GFR, Estimated: >60 mL/min (ref >60)
Glucose, Bld: 75 mg/dL (ref 70–99)
Potassium: 3.4 mmol/L — ABNORMAL LOW (ref 3.5–5.1)
Sodium: 137 mmol/L (ref 135–145)
Total Bilirubin: 0.9 mg/dL (ref 0.3–1.2)
Total Protein: 6.8 g/dL (ref 6.5–8.1)

## 2022-12-17 LAB — PHOSPHORUS: Phosphorus: 3.6 mg/dL (ref 2.5–4.6)

## 2022-12-17 LAB — MAGNESIUM: Magnesium: 1.7 mg/dL (ref 1.7–2.4)

## 2022-12-17 MED ORDER — ONDANSETRON 4 MG PO TBDP: 4.0000 mg | ORAL_TABLET | Freq: Four times a day (QID) | ORAL | 0 refills | Status: DC | PRN

## 2022-12-17 MED ORDER — PANTOPRAZOLE SODIUM 40 MG PO TBEC: 40.0000 mg | DELAYED_RELEASE_TABLET | Freq: Every day | ORAL | 0 refills | Status: DC

## 2022-12-17 MED ORDER — ACETAMINOPHEN 500 MG PO TABS: 1000.0000 mg | ORAL_TABLET | Freq: Three times a day (TID) | ORAL | 0 refills | Status: DC | PRN

## 2022-12-17 MED ORDER — CHLORDIAZEPOXIDE HCL 25 MG PO CAPS: ORAL_CAPSULE | ORAL | 0 refills | Status: AC

## 2022-12-17 MED ORDER — SERTRALINE HCL 25 MG PO TABS
25.0000 mg | ORAL_TABLET | Freq: Every day | ORAL | Status: DC
  Administered 2022-12-17: 25 mg via ORAL
  Filled 2022-12-17: qty 1

## 2022-12-17 MED ORDER — ACETAMINOPHEN 500 MG PO TABS
1000.0000 mg | ORAL_TABLET | Freq: Three times a day (TID) | ORAL | Status: DC | PRN
  Administered 2022-12-17: 1000 mg via ORAL
  Filled 2022-12-17: qty 2

## 2022-12-17 MED ORDER — SERTRALINE HCL 25 MG PO TABS: 25.0000 mg | ORAL_TABLET | Freq: Every day | ORAL | 0 refills | Status: DC

## 2022-12-17 NOTE — Progress Notes (Signed)
  Echocardiogram 2D Echocardiogram has been performed.  Janalyn Harder 12/17/2022, 3:06 PM

## 2022-12-17 NOTE — Plan of Care (Signed)

## 2022-12-17 NOTE — Progress Notes (Addendum)
Page sent to IM night intern Johny Blamer MD regarding Pt HR. Awaiting call back. Pt is not symptomatic at this time and Pt is bradycardic at baseline. Call back was received and I was advised to monitor Pt for S/S associated with bradycardia and to contact MD if Pt becomes symptomatic. Nursing plan of care ongoing.

## 2022-12-17 NOTE — Discharge Summary (Signed)
Name: Mason Jones MRN: [id number] DOB: 1985-10-26 37 y.o. PCP: [name], DO  Date of Admission: 12/16/2022  9:45 AM Date of Discharge: 12/17/2022 Attending Physician: Dr. Lafonda Mosses  Discharge Diagnosis: Principal Problem:   Alcohol withdrawal (HCC) Active Problems:   Acute alcoholic gastritis without hemorrhage    Discharge Medications: Allergies as of 12/17/2022   No Known Allergies      Medication List     TAKE these medications    acetaminophen 500 MG tablet Commonly known as: TYLENOL Take 2 tablets (1,000 mg total) by mouth every 8 (eight) hours as needed for headache.   chlordiazePOXIDE 25 MG capsule Commonly known as: LIBRIUM Take 1 capsule (25 mg total) by mouth 4 (four) times daily for 2 days, THEN 1 capsule (25 mg total) 3 (three) times daily for 1 day, THEN 1 capsule (25 mg total) 2 (two) times daily in the am and at bedtime. for 1 day, THEN 1 capsule (25 mg total) daily for 1 day. Start taking on: December 17, 2022   methadone 10 MG/5ML solution Commonly known as: DOLOPHINE Take 115 mLs by mouth daily.   ondansetron 4 MG disintegrating tablet Commonly known as: ZOFRAN-ODT Take 1 tablet (4 mg total) by mouth every 6 (six) hours as needed for nausea or vomiting.   pantoprazole 40 MG tablet Commonly known as: Protonix Take 1 tablet (40 mg total) by mouth daily.   sertraline 25 MG tablet Commonly known as: ZOLOFT Take 1 tablet (25 mg total) by mouth daily.        Disposition and follow-up:   Mr.Mak Avera was discharged from [facility] in Good condition.  At the hospital follow up visit please address:  1.  Follow-up:  a. Any return of withdrawal symptoms. Access to treatment centers.    b. Response on SSRI and need for as needed anxiolytic.   c. F/u HCV RNA quant and treatment.  Follow-up Appointments:  Follow-up Information     [name], DO Follow up on 12/22/2022.   Specialty: Internal Medicine Why: At 3:15 PM,  please arrive 15-30 minutes early Contact information: [address] [place] [address] [phone]                 Hospital Course by problem list: Mason Jones is a 37 year old male with a past medical history of alcohol use disorder and opiate use disorder who presented with abdominal pain and desire to detox from alcohol.  For the past 8 years he has consumed about 2 L of vodka daily.  Prior to this he also had alcohol use disorder and went through a temporarily successful detox which lasted for about 3 weeks around 2016.  Unfortunately he had a death in the family and began to drink again at that time.  Since then he has not gone more than a day without a drink.  He has no known history of severe alcohol withdrawals with seizures.  On arrival he was bradycardic, tremulous, anxious, but alert and oriented and otherwise without significant abnormalities on exam.  Lab evaluation showed mild elevation in liver enzymes consistent with alcohol use.  There is no evidence of cirrhosis on exam, labs, or imaging.  CIWA scores in the ED were 17.  He was started on Librium taper and CIWA scores dropped to 11 and then 0 and stayed there until the next day.  His alcohol withdrawal was well-controlled on the Librium taper.  His reasoning for seeking alcohol detox was due  to loss of relationship and contact with his child.  In the afternoon of the day after admission he expressed a desire to go home to be able to see his child who he had not seen in about 2 months.  He also discussed his long history of severe depression and panic attacks and likely PTSD.  As an adult he has had fear of crowds, even a group of more than 3 people, loud noises, and other such things that cause panic attacks.  He also has baseline anxiety and depression without mania.  For the past 8 years since he lost a loved one he has had worsening mood.  He denies current or prior suicidal ideation.  He has previously been on an SSRI and  Wellbutrin that were related to his last alcohol detox.  These medicines were paired with 3 times weekly therapy.  He did not remember these working but was only on them for a couple weeks before he resumed drinking alcohol.  He was amenable to starting an SSRI and getting close follow-up in our clinic.  Due to his stable alcohol withdrawal on Librium taper and plan for outpatient alcohol use disorder treatment as well as sound and consistent reasoning behind his decisions he was discharged with a extended Librium taper, started on sertraline, and with close follow-up in our clinic as well as strict return precautions.  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  Due to his significant alcohol use and cardiac conduction abnormalities we obtained an echo which showed no significant abnormalities.  Tentative plan for Zio patch outpatient and cardiology follow-up but this will be after he detoxes from alcohol.  We do not suspect cardiac syncope.  After oral rehydration and treatment of alcohol withdrawal his orthostatics were negative here.  Will follow-up in the clinic on any further syncope or presyncope.    Discharge Subjective: Patient is doing well this afternoon and would like to go home to see his son.  He is confident in his ability to pick up the remainder of his Librium taper as well as having good support from a neighbor who would be able to bring him back to the hospital if he started having any return of his withdrawal symptoms or other needs.  Discharge Exam:   BP 133/80 (BP Location: Left Arm)   Pulse (!) 53   Temp 98.2 F (36.8 C) (Oral)   Resp 17   Ht 5\' 10"  (1.778 m)   Wt 75.8 kg   SpO2 99%   BMI 23.98 kg/m  Constitutional: well-appearing adult male sitting in bed, in no acute distress Pulmonary/Chest: normal work of breathing on room air  Pertinent Labs, Studies, and  Procedures:     Latest Ref Rng & Units 12/16/2022   10:08 AM 12/13/2022   11:26 AM 05/21/2019   10:26 PM  CBC  WBC 4.0 - 10.5 K/uL 6.6  6.9  6.0   Hemoglobin 13.0 - 17.0 g/dL 48.1  85.9  09.3   Hematocrit 39.0 - 52.0 % 41.0  42.8  43.9   Platelets 150 - 400 K/uL 259  235  242        Latest Ref Rng & Units 12/17/2022   10:10 AM 12/16/2022   10:08 AM 12/13/2022   11:26 AM  CMP  Glucose 70 - 99 mg/dL 75  112  162   BUN 6 - 20 mg/dL  8  11  13    Creatinine 0.61 - 1.24 mg/dL 1.61  0.96  0.45   Sodium 135 - 145 mmol/L 137  138  137   Potassium 3.5 - 5.1 mmol/L 3.4  4.3  3.8   Chloride 98 - 111 mmol/L 100  106  102   CO2 22 - 32 mmol/L 27  20  23    Calcium 8.9 - 10.3 mg/dL 9.0  9.1  8.8   Total Protein 6.5 - 8.1 g/dL 6.8  7.7  7.9   Total Bilirubin 0.3 - 1.2 mg/dL 0.9  1.3  0.8   Alkaline Phos 38 - 126 U/L 72  90  93   AST 15 - 41 U/L 96  125  101   ALT 0 - 44 U/L 46  48  57     ECHOCARDIOGRAM COMPLETE  Result Date: 12/17/2022    ECHOCARDIOGRAM REPORT   Patient Name:   Mason Jones Date of Exam: 12/17/2022 Medical Rec #:  [id number]  Height:       70.0 in Accession #:    [id number] Weight:       167.1 lb Date of Birth:  April 15, 1985   BSA:          1.934 m Patient Age:    37 years   BP:           127/77 mmHg Patient Gender: M          HR:           47 bpm. Exam Location:  Inpatient Procedure: 2D Echo, 3D Echo, Cardiac Doppler and Color Doppler Indications:    R06.02 SOB  History:        Patient has no prior history of Echocardiogram examinations.                 ETOH. Polysubstance abuse.  Sonographer:    Sheralyn Boatman RDCS Referring Phys: [id number] JULIE MACHEN IMPRESSIONS  1. Left ventricular ejection fraction, by estimation, is 55 to 60%. The left ventricle has normal function. The left ventricle has no regional wall motion abnormalities. Left ventricular diastolic parameters were normal.  2. Right ventricular systolic function is normal. The right ventricular size is normal.  3. The mitral valve is  normal in structure. Mild mitral valve regurgitation. No evidence of mitral stenosis.  4. The aortic valve is normal in structure. Aortic valve regurgitation is not visualized. No aortic stenosis is present.  5. The inferior vena cava is normal in size with greater than 50% respiratory variability, suggesting right atrial pressure of 3 mmHg. FINDINGS  Left Ventricle: Left ventricular ejection fraction, by estimation, is 55 to 60%. The left ventricle has normal function. The left ventricle has no regional wall motion abnormalities. The left ventricular internal cavity size was normal in size. There is  no left ventricular hypertrophy. Left ventricular diastolic parameters were normal. Right Ventricle: The right ventricular size is normal. No increase in right ventricular wall thickness. Right ventricular systolic function is normal. Left Atrium: Left atrial size was normal in size. Right Atrium: Right atrial size was normal in size. Pericardium: There is no evidence of pericardial effusion. Mitral Valve: The mitral valve is normal in structure. Mild mitral valve regurgitation. No evidence of mitral valve stenosis. Tricuspid Valve: The tricuspid valve is normal in structure. Tricuspid valve regurgitation is not demonstrated. No evidence of tricuspid stenosis. Aortic Valve: The aortic valve is normal in structure. Aortic valve regurgitation is not visualized. No aortic stenosis is present.  Pulmonic Valve: The pulmonic valve was normal in structure. Pulmonic valve regurgitation is not visualized. No evidence of pulmonic stenosis. Aorta: The aortic root is normal in size and structure. Venous: The inferior vena cava is normal in size with greater than 50% respiratory variability, suggesting right atrial pressure of 3 mmHg. IAS/Shunts: No atrial level shunt detected by color flow Doppler.  LEFT VENTRICLE PLAX 2D LVIDd:         4.70 cm      Diastology LVIDs:         3.25 cm      LV e' medial:    12.70 cm/s LV PW:          0.90 cm      LV E/e' medial:  5.5 LV IVS:        0.80 cm      LV e' lateral:   17.50 cm/s LVOT diam:     2.70 cm      LV E/e' lateral: 4.0 LV SV:         115 LV SV Index:   59 LVOT Area:     5.73 cm  LV Volumes (MOD) LV vol d, MOD A2C: 108.0 ml LV vol d, MOD A4C: 112.0 ml LV vol s, MOD A2C: 40.8 ml LV vol s, MOD A4C: 42.2 ml LV SV MOD A2C:     67.2 ml LV SV MOD A4C:     112.0 ml LV SV MOD BP:      69.3 ml RIGHT VENTRICLE             IVC RV S prime:     12.70 cm/s  IVC diam: 1.80 cm TAPSE (M-mode): 2.6 cm LEFT ATRIUM             Index        RIGHT ATRIUM           Index LA diam:        3.10 cm 1.60 cm/m   RA Area:     11.30 cm LA Vol (A2C):   37.6 ml 19.44 ml/m  RA Volume:   24.50 ml  12.67 ml/m LA Vol (A4C):   35.6 ml 18.41 ml/m LA Biplane Vol: 40.1 ml 20.74 ml/m  AORTIC VALVE LVOT Vmax:   93.30 cm/s LVOT Vmean:  61.300 cm/s LVOT VTI:    0.200 m  AORTA Ao Root diam: 3.30 cm Ao Asc diam:  3.10 cm MITRAL VALVE MV Area (PHT): 2.74 cm    SHUNTS MV Decel Time: 277 msec    Systemic VTI:  0.20 m MV E velocity: 70.25 cm/s  Systemic Diam: 2.70 cm MV A velocity: 49.80 cm/s MV E/A ratio:  1.41 Aditya Sabharwal Electronically signed by Dorthula Nettles Signature Date/Time: 12/17/2022/3:36:28 PM    Final      Discharge Instructions: Discharge Instructions     Discharge instructions   Complete by: As directed    [name],  You were recently admitted to [facility] for alcohol withdrawal.  We started you on Librium which you will taper off on the schedule below.  We have also started you on a medication called naltrexone which you will take daily that can help with alcohol cravings.  We also started an antidepressant, sertraline, which should help with some of your anxiety and depression.  This medication is not fast acting and will start having effects in about 4 to 6 weeks.  I will see you in clinic on Wednesday, 12/22/2022 at 3:15  PM and we can discuss your medications and your progress.  If you have  any issues before then please do not hesitate to call [phone] which is our clinic number and we can help. If it is over the weekend or after 5 PM call [phone] and ask for the resident on call.   Continue taking your home medications with the following changes  1. Start taking  a. Librium (chlordiazepoxide) 25 mg every 6 hours for 1 more day, then every 8 hours for 1 day (12/19/2022), then every 12 hours for 1 day (12/20/2022), then once for the last day on 12/21/2022  b. Naltrexone 50 mg daily  c. Sertraline 25 mg daily  d. Tylenol (acetaminophen) 1000 mg every 8 hours as needed for pain or headaches  e. Pantoprazole 40 mg daily 2. Continue taking  a. Methadone 115 mg daily  You should seek further medical care if any of your symptoms related to your alcohol withdrawal return including hand tremors, severe anxiety, profuse sweating, elevated heart rate, or any other symptoms that you had during your hospitalization.  If these symptoms are severe and not responding to the medication we have you on please return to the hospital.  Sincerely, [name], DO   Increase activity slowly   Complete by: As directed        Signed: [name], DO Internal Medicine Resident, PGY-2 Pager# [phone] 12/17/2022, 6:57 PM   Please contact the on call pager after 5 pm and on weekends at [phone].

## 2022-12-17 NOTE — Progress Notes (Signed)
Pt has shown no S/S of ETOH withdrawal throughout 12 hour shift. Nursing plan of care ongoing

## 2022-12-17 NOTE — Progress Notes (Signed)
2D echo attempted, MD in room. Will try echo later.

## 2022-12-17 NOTE — Progress Notes (Signed)
HD#1 SUBJECTIVE:  Patient Summary: Mason Jones is a 37 y.o. male with pertinent PMH of alcohol/polysubstance use disorder on methadone maintenance therapy who presented with abdominal pain and is admitted for alcohol withdrawal.   Overnight Events: On-call resident was contacted about bradycardia as low as 38. Asymptomatic - no interventions done. He has a history of this. See plan for details.  Interim History: He does not feel great this morning - he feels weak, anxious, sweats, nausea. No seizures. Got some sleep. He does feel that his tremors have improved.  OBJECTIVE:  Vital Signs: Vitals:   12/17/22 0415 12/17/22 0418 12/17/22 0600 12/17/22 0836  BP:  114/84  127/77  Pulse:  (!) 42 60   Resp:      Temp:  97.8 F (36.6 C)  97.9 F (36.6 C)  TempSrc:  Oral  Oral  SpO2:  97%    Weight: 75.8 kg     Height:       Supplemental O2: Room Air SpO2: 97 %  Filed Weights   12/16/22 0959 12/17/22 0415  Weight: 74.8 kg 75.8 kg     Intake/Output Summary (Last 24 hours) at 12/17/2022 1150 Last data filed at 12/17/2022 0700 Gross per 24 hour  Intake --  Output 750 ml  Net -750 ml   Net IO Since Admission: -750 mL [12/17/22 1150]  Physical Exam: Physical Exam Constitutional:      General: He is not in acute distress.    Appearance: He is well-developed. He is not ill-appearing.  Cardiovascular:     Rate and Rhythm: Normal rate and regular rhythm.     Heart sounds: Normal heart sounds.  Pulmonary:     Effort: Pulmonary effort is normal.     Breath sounds: Normal breath sounds.  Abdominal:     General: Abdomen is flat. Bowel sounds are normal.     Tenderness: There is abdominal tenderness in the left upper quadrant. There is no guarding or rebound.     Comments: LUQ tenderness is less than yesterday  Skin:    General: Skin is warm and dry.     Capillary Refill: Capillary refill takes less than 2 seconds.  Neurological:     General: No focal deficit present.     Mental  Status: He is alert and oriented to person, place, and time.  Psychiatric:        Mood and Affect: Mood normal.        Behavior: Behavior normal.    Patient Lines/Drains/Airways Status     Active Line/Drains/Airways     Name Placement date Placement time Site Days   Peripheral IV 12/16/22 20 G Anterior;Distal;Left;Upper Arm 12/16/22  1403  Arm  1            ASSESSMENT/PLAN:  Assessment: Principal Problem:   Alcohol withdrawal (HCC)  Mason Jones is a 37 y.o. male with pertinent PMH of alcohol/polysubstance use disorder on methadone maintenance therapy who presented with abdominal pain and is admitted for alcohol withdrawal.   Plan: Ethanol Withdrawal / Alcohol Use Disorder Morning 1 of hospital stay on Librium protocol. His CIWAs are 0 overnight but he does report discomfort - anxiety, weakness, nausea. He has scheduled librium and we emphasized the prn meds that are available to him. Will be receptive to his symptoms to ease them as able. This is a patient with significant daily alcohol consumption - 1 gallon vodka every 1-2 days - with overall dependence dating to eight years. Last drink was @  2200 on 9/18.  - Hepatitis panel with HCV reactive, will get quant - monitor CBC, Mg, Phos - CCM: sinus bradycardia with 1st degree block PLAN - Librium protocol for ethanol withdrawal. Multivitamin, Folic acid, and thiamine daily - Hydroxyzine per CIWA - Will start naltrexone over weekend to continue at discharge - Consulted social work for [employer] re recovery and sobriety - AFTER HOSPITALIZATION: pt is motivated for continued sobriety and would like to explore inpatient rehab options.   Abdominal Pain Nausea/Vomiting This is getting better. Pt with 3+ months of pain, N/V that coincides with increased alcohol intake. Most likely this is alcoholic gastritis and will provide symptomatic treatment - he is somewhat better this morning. Will continue to monitor and consider  alternatives causes such as PUD throughout course. Importantly, laboratory and imaging workup are unremarkable- including CBC, BMP, lipase, CXR and CT Abdomen that were WNL. He does have transaminitis in setting of alcohol consumption. - Zofran 4mg  prn - Protonix 40mg  BID   Hx Intermittent Syncope Bradycardia Reports history of intermittent syncope when standing up, consistently an orthostatic pattern, most recent 2 weeks ago occurring monthly. Overnight HR 38 and he has been documented in 30s in his past. ECG and tele with sinus bradycardia, 1st degree block, at times PVCs / ventricular bigeminy. 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 - Echo pending - Walking telemetry, look for appropriate HR response - Consider heart monitor, outpatient cardiac ref per echo   Transaminitis Mild, AST > AST, concurrent with underlying alcohol use disorder. Pt is not jaundiced. CT abdomen did not reveal cirrhosis or other liver pathology. - monitor - Hepatitis panel + HCV with quant pending per above   Opiate Use Disorder on Treatment with Methadone Former heroin abuse with about 8 years sobriety. Currently taking methadone 115 every day, confirmed by his clinic [facility], Dr. Allyne Jones. - Will continue methadone - HIV antibody screen was negative   Tobacco Use - 1ppd x 6 years Nicotine patch, 21mg .  History of PTSD Have not explored this during his detoxification. Likely it influences his alcohol use. Will evaluate as appropriate here, further as outpatient. Can consider psychiatric help as indicated.  Best Practice: Diet: Regular diet IVF: none VTE: rivaroxaban (XARELTO) tablet 10 mg Start: 12/16/22 1630 Code: Full AB: none DISPO: Anticipated discharge in 2 days to Home pending  symptoms . Pt is welcome to establish care in the [facility] clinic.  Signature: Mason Jones, D.O.  Internal Medicine  Resident, PGY-1 [facility]  Pager: [phone] 11:50 AM, 12/17/2022   Please contact the on call pager after 5 pm and on weekends at [phone].

## 2022-12-17 NOTE — Discharge Instructions (Addendum)
Mason Jones,  You were recently admitted to [facility] for alcohol withdrawal.  We started you on Librium which you will taper off on the schedule below.  We have also started you on a medication called naltrexone which you will take daily that can help with alcohol cravings.  We also started an antidepressant, sertraline, which should help with some of your anxiety and depression.  This medication is not fast acting and will start having effects in about 4 to 6 weeks.  I will see you in clinic on Wednesday, 12/22/2022 at 3:15 PM and we can discuss your medications and your progress.  If you have any issues before then please do not hesitate to call [phone] which is our clinic number and we can help. If it is over the weekend or after 5 PM call [phone] and ask for the resident on call.   Continue taking your home medications with the following changes  Start taking Librium (chlordiazepoxide) 25 mg every 6 hours for 1 more day, then every 8 hours for 1 day (12/19/2022), then every 12 hours for 1 day (12/20/2022), then once for the last day on 12/21/2022 Naltrexone 50 mg daily Sertraline 25 mg daily Tylenol (acetaminophen) 1000 mg every 8 hours as needed for pain or headaches Pantoprazole 40 mg daily Continue taking Methadone 115 mg daily  You should seek further medical care if any of your symptoms related to your alcohol withdrawal return including hand tremors, severe anxiety, profuse sweating, elevated heart rate, or any other symptoms that you had during your hospitalization.  If these symptoms are severe and not responding to the medication we have you on please return to the hospital.  Sincerely, [name], DO

## 2022-12-18 LAB — HCV RNA QUANT
HCV Quantitative Log: 6.839 log10 IU/mL (ref >1.70)
HCV Quantitative: 6900000 IU/mL (ref >50)

## 2022-12-21 DIAGNOSIS — F1120 Opioid dependence, uncomplicated: Secondary | ICD-10-CM | POA: Diagnosis not present

## 2022-12-22 ENCOUNTER — Encounter: Payer: 59 | Admitting: Student

## 2022-12-22 NOTE — Progress Notes (Deleted)
37 year old male with AUD, OUD on methadone, and PTSD

## 2022-12-28 DIAGNOSIS — F1120 Opioid dependence, uncomplicated: Secondary | ICD-10-CM | POA: Diagnosis not present

## 2023-01-04 DIAGNOSIS — F1120 Opioid dependence, uncomplicated: Secondary | ICD-10-CM | POA: Diagnosis not present

## 2023-01-10 ENCOUNTER — Encounter: Payer: 59 | Admitting: Student

## 2023-01-10 NOTE — Progress Notes (Deleted)
37 year old male with AUD, OUD on methadone, and PTSD  Hep C positive

## 2023-01-11 DIAGNOSIS — F1120 Opioid dependence, uncomplicated: Secondary | ICD-10-CM | POA: Diagnosis not present

## 2023-01-13 NOTE — Progress Notes (Signed)
Unfortunately this patient has not made it to his 2 appointments that were scheduled with me over the past couple weeks.  I attempted to call him but his line go straight to a busy tone.  I also called his alternate contact which he prefers as his neighbor without success in reaching them.  I will attempt to call again tomorrow to set him up for a follow-up with our clinic and we will need to do hepatitis C genotyping as well as possibly a FibroSure test before proceeding with hepatitis C treatment.

## 2023-01-18 ENCOUNTER — Encounter: Payer: Self-pay | Admitting: Internal Medicine

## 2023-01-18 ENCOUNTER — Ambulatory Visit (INDEPENDENT_AMBULATORY_CARE_PROVIDER_SITE_OTHER): Payer: 59 | Admitting: Internal Medicine

## 2023-01-18 ENCOUNTER — Other Ambulatory Visit: Payer: Self-pay

## 2023-01-18 VITALS — BP 162/76 | HR 40 | Temp 99.2°F | Ht 70.0 in | Wt 170.6 lb

## 2023-01-18 DIAGNOSIS — F1010 Alcohol abuse, uncomplicated: Secondary | ICD-10-CM

## 2023-01-18 DIAGNOSIS — F1121 Opioid dependence, in remission: Secondary | ICD-10-CM

## 2023-01-18 DIAGNOSIS — F1120 Opioid dependence, uncomplicated: Secondary | ICD-10-CM | POA: Diagnosis not present

## 2023-01-18 DIAGNOSIS — F339 Major depressive disorder, recurrent, unspecified: Secondary | ICD-10-CM

## 2023-01-18 MED ORDER — SERTRALINE HCL 25 MG PO TABS: 25.0000 mg | ORAL_TABLET | Freq: Every day | ORAL | 2 refills | Status: DC

## 2023-01-18 MED ORDER — NICOTINE 21 MG/24HR TD PT24: 21.0000 mg | MEDICATED_PATCH | TRANSDERMAL | 1 refills | Status: DC

## 2023-01-18 NOTE — Patient Instructions (Addendum)
Hello Mr. Mason Jones,   It was a pleasure to meet you today.   We will plan to see you again in approximately 2-3 weeks to follow up on how things are going.   Please take the prescribed zoloft 50mg  daily for your mood.   When you decide to go to the ED for alcohol detox, you can tell them you are an [facility] patient and would like to be admitted for alcohol detox.   Our social work team will reach out to you about any help they can provide.   Thanks,  Dr Carlynn Purl

## 2023-01-19 ENCOUNTER — Encounter: Payer: Self-pay | Admitting: Internal Medicine

## 2023-01-19 DIAGNOSIS — F1010 Alcohol abuse, uncomplicated: Secondary | ICD-10-CM | POA: Insufficient documentation

## 2023-01-19 DIAGNOSIS — F1090 Alcohol use, unspecified, uncomplicated: Secondary | ICD-10-CM | POA: Insufficient documentation

## 2023-01-19 MED ORDER — SERTRALINE HCL 25 MG PO TABS: 50.0000 mg | ORAL_TABLET | Freq: Every day | ORAL | 2 refills | Status: DC

## 2023-01-19 NOTE — Progress Notes (Signed)
Subjective:  CC: Hospital follow up  HPI:  Mr.Desman Novakovich is a 37 y.o. male with a past medical history stated below and presents today for above. Please see problem based assessment and plan for additional details.  Past Medical History:  Diagnosis Date   Anxiety    Depression    Drug abuse (HCC)    PTSD (post-traumatic stress disorder)     Current Outpatient Medications on File Prior to Visit  Medication Sig Dispense Refill   acetaminophen (TYLENOL) 500 MG tablet Take 2 tablets (1,000 mg total) by mouth every 8 (eight) hours as needed for headache. 30 tablet 0   methadone (DOLOPHINE) 10 MG/5ML solution Take 115 mLs by mouth daily.     ondansetron (ZOFRAN-ODT) 4 MG disintegrating tablet Take 1 tablet (4 mg total) by mouth every 6 (six) hours as needed for nausea or vomiting. 20 tablet 0   pantoprazole (PROTONIX) 40 MG tablet Take 1 tablet (40 mg total) by mouth daily. 30 tablet 0   No current facility-administered medications on file prior to visit.    Review of Systems: ROS negative except for as is noted on the assessment and plan.  Objective:   Vitals:   01/18/23 0945 01/18/23 1001  BP: (!) 148/81 (!) 162/76  Pulse: (!) 44 (!) 40  Temp: 99.2 F (37.3 C)   TempSrc: Oral   SpO2: 97%   Weight: 170 lb 9.6 oz (77.4 kg)   Height: 5\' 10"  (1.778 m)     Physical Exam: Constitutional: well-appearing, in no acute distress HENT: normocephalic atraumatic, mucous membranes moist Eyes: conjunctiva non-erythematous Neck: supple Cardiovascular: regular rate and rhythm, no m/r/g Pulmonary/Chest: normal work of breathing on room air, lungs clear to auscultation bilaterally Abdominal: soft, non-tender, non-distended MSK: normal bulk and tone Neurological: alert & oriented x 3, 5/5 strength in bilateral upper and lower extremities, normal gait Skin: warm and dry Psych: appears emotional, tearful  Assessment & Plan:   Major depression, recurrent Patient has been taking  Zoloft 25mg  for two weeks without any change in anxiety or depressive symptoms. He endorses intermittent suicidal thoughts without any suicidal plan, and no thoughts to hurt others. He recently lost his job, he doesn't have a current method of transportation, and he feels he is in danger of losing his living situation. Counseled that SSRIs may take up to 6 weeks to achieve full dose effect.  -Zoloft increased to 50mg  -Urgent referral to care management placed  Opioid dependence Patient has been on methadone for approximately 7 years. He recently lost his source of transportation, and the service he uses to get rides to his appointments is inconsistent. He relies on his neighbor to get him rides to receive his methadone and to other appointments. He has not had a relapse, but has struggled with getting methadone on time.  -Referral to care management placed  Alcohol abuse Patient has been drinking less since discharge from the hospital. He reports currently approximately 4 16 oz malt beverages per day, where before he was having up to a gallon of vodka per day. He is interested in completely quitting. Discussed admission to the hospital for supervised alcohol withdrawal when he feels ready. Patient stated he has some business he has to take care of at home first, and then will go to the ED when he is prepared. I provided instructions on how to identify himself as an [facility] patient for when comes to the ED for admission.     Patient seen  with Dr. Assunta Gambles MD [facility] Internal Medicine  PGY-1 Pager: [phone] Date 01/19/2023  Time 8:17 AM

## 2023-01-19 NOTE — Assessment & Plan Note (Signed)
Patient has been taking Zoloft 25mg  for two weeks without any change in anxiety or depressive symptoms. He endorses intermittent suicidal thoughts without any suicidal plan, and no thoughts to hurt others. He recently lost his job, he doesn't have a current method of transportation, and he feels he is in danger of losing his living situation. Counseled that SSRIs may take up to 6 weeks to achieve full dose effect.  -Zoloft increased to 50mg  -Urgent referral to care management placed

## 2023-01-19 NOTE — Assessment & Plan Note (Signed)
Patient has been drinking less since discharge from the hospital. He reports currently approximately 4 16 oz malt beverages per day, where before he was having up to a gallon of vodka per day. He is interested in completely quitting. Discussed admission to the hospital for supervised alcohol withdrawal when he feels ready. Patient stated he has some business he has to take care of at home first, and then will go to the ED when he is prepared. I provided instructions on how to identify himself as an [facility] patient for when comes to the ED for admission.

## 2023-01-19 NOTE — Assessment & Plan Note (Signed)
Patient has been on methadone for approximately 7 years. He recently lost his source of transportation, and the service he uses to get rides to his appointments is inconsistent. He relies on his neighbor to get him rides to receive his methadone and to other appointments. He has not had a relapse, but has struggled with getting methadone on time.  -Referral to care management placed

## 2023-01-20 ENCOUNTER — Other Ambulatory Visit: Payer: Self-pay | Admitting: Licensed Clinical Social Worker

## 2023-01-20 ENCOUNTER — Encounter: Payer: Self-pay | Admitting: Internal Medicine

## 2023-01-20 NOTE — Patient Instructions (Signed)
Tailored Plan Medicaid On July 1, some people on [place] Medicaid will move to a new kind of Medicaid health plan called a Tailored Plan. Tailored Plans cover your doctor visits, prescription drugs, and health care services.    If your [place] Medicaid will move to a Tailored Plan, you should have gotten a letter and welcome packet. If you're not sure, call your [place] Medicaid Enrollment Broker at [phone] and ask.  Check out these free materials, in [place] and [place], to learn more about your Tailored Plan: [email or web address]/Tailored-Plans/Toolkit  Tailored Care Management Services  TCM services are available to you now. If you are a Tailored Plan member or will be and want information about Tailored Care Management Services including rides to appointments and community and home services, call the Care Management provider for your county of residence:    [facility] ([place], [place])  Member Services: [phone] Behavioral Health Crisis Line: 847-124-1002, [place], [place], [place], [place])  Member Services: 6818165155 Behavioral Health Crisis Line: 680-330-7611  [facility] [place]) Member Services: [phone] Behavioral Health Crisis Line: (682)545-8992     Dickie La, BSW, MSW, LCSW Managed Medicaid LCSW [facility]  [facility] [email or web address] Phone: [phone]

## 2023-01-20 NOTE — Patient Outreach (Addendum)
Medicaid Managed Care Social Work Note  01/20/2023 Name:  Mason Jones MRN:  [id number] DOB:  1985-10-09  Mason Jones is an 37 y.o. year old male who is a primary patient of [name], DO.  The [facility] Managed Care Coordination team was consulted for assistance with:  Community Resources   Mason Jones was given information about Medicaid Managed Care Coordination team services today. Mason Jones has [employer] and was advised to contact program to get connected with their case management program. Patient is agreeable. Patient reports being interested in gaining inpatient substance abuse treatment for his ongoing alcohol abuse but is having issues with getting his methadone prescription approved by his physician at his Methadone [facility]. [facility] Jones provided patient with extensive education on substance abuse resources. Patient was also provided emotional support over the phone. He denies any SI/HI but admits he is in a stressful situation regarding his housing and family and is eager to gain inpatient treatment immediately and has already been approved by [facility] for admission (without having detox per patient which [facility] Jones questioned) but is waiting to hear back from his methadone treatment center regarding their approval as his take home methadone privileges were taken away recently due to alcohol usage. Patient was wondering if Mason Jones could prescribe this medicine for him but this [facility] Jones declined as this will have to come from his prescribing medicine provider who is familiar and comfortable with him having this prescription. Patient declined wanting information on sober living options located in his area. Patient declined wanting a list of AA/NA meetings as he reports that this will not be helpful but patient did agree to physically write down other substance abuse inpatient resource options within his area.   [facility] Jones contacted [employer] and left a message with provider Mason Gardener, Mason Jones regarding this case. Return call made by provider and care coordination was completed. Mason Jones will be his new [employer] assigned PCP provider and will be seeing this patient today in person.   Engaged with patient  for by telephone for initial visit in response to referral for case management and/or care coordination services.   Assessments/Interventions:  Review of past medical history, allergies, medications, health status, including review of consultants reports, laboratory and other test data, was performed as part of comprehensive evaluation and provision of chronic care management services.  SDOH: (Social Determinant of Health) assessments and interventions performed: SDOH Interventions    Flowsheet Row Office Visit from 01/18/2023 in [facility]  SDOH Interventions   Food Insecurity Interventions Intervention Not Indicated  Housing Interventions Intervention Not Indicated  Transportation Interventions Intervention Not Indicated  Utilities Interventions Intervention Not Indicated  Alcohol Usage Interventions Other (Comment)  [pt stated that he has been drinking  daily for 8 years ...... he is  wanting sober living referral today at his appt]  Financial Strain Interventions Intervention Not Indicated  Physical Activity Interventions Intervention Not Indicated  Stress Interventions Intervention Not Indicated  Social Connections Interventions Intervention Not Indicated  Health Literacy Interventions Intervention Not Indicated       Advanced Directives Status:  Not addressed in this encounter.  Care Plan                 No Known Allergies  Medications Reviewed Today   Medications were not reviewed in this encounter     Patient Active Problem List   Diagnosis Date Noted   Alcohol abuse 01/19/2023   Acute alcoholic gastritis  without hemorrhage 12/17/2022   Alcohol withdrawal (HCC) 12/16/2022   MDD (major depressive disorder),  recurrent episode, severe (HCC) 01/12/2015   Opiate dependence (HCC) 11/14/2013   Benzodiazepine abuse (HCC) 11/14/2013   Severe recurrent major depression without psychotic features (HCC) 11/13/2013   Polysubstance dependence (HCC) 11/02/2013   PTSD (post-traumatic stress disorder) 10/08/2013   Opioid use with withdrawal (HCC) 10/08/2013   GAD (generalized anxiety disorder) 10/08/2013   Major depression, recurrent (HCC) 10/08/2013   Opioid dependence (HCC) 10/03/2013   Plan: Patient will contact [facility] to get connected to their case management program. Patient wrote this number physically down and agreed to contact them as soon as possible to gain this case management services. [facility] program will close referral per [facility] program policy to prevent duplication of services.   Dickie La, BSW, MSW, [employer] Managed Medicaid Jones [facility]  [facility] [email or web address] Phone: [phone]

## 2023-01-21 NOTE — Progress Notes (Signed)
Internal Medicine Clinic Attending  I was physically present during the key portions of the resident provided service and participated in the medical decision making of patient's management care. I reviewed pertinent patient test results.  The assessment, diagnosis, and plan were formulated together and I agree with the documentation in the resident's note.  Mercie Eon, MD   This is a really challenging situation. 37yo man with severe alcohol use disorder, OUD previously on methadone therapy, depression, anxiety, and unstable housing who presented for hospital follow up today. I saw him in the hospital too. He is interested in quitting alcohol, but would like to have a clear plan for what treatment facility he would go to before quitting. I strongly recommend in-hospital detox and he agrees with this.  For today, we increased his Sertraline to 50mg  daily. We referred him to Clinical Social Work to assist with resources for alcohol cessation. We counseled him to come to the ER any time of day or night when he is ready to stop drinking, and we can assist him with getting through the withdrawal stage. I agree with very close follow up

## 2023-01-25 DIAGNOSIS — F1120 Opioid dependence, uncomplicated: Secondary | ICD-10-CM | POA: Diagnosis not present

## 2023-01-27 DIAGNOSIS — R251 Tremor, unspecified: Secondary | ICD-10-CM | POA: Diagnosis not present

## 2023-01-27 DIAGNOSIS — F1120 Opioid dependence, uncomplicated: Secondary | ICD-10-CM | POA: Diagnosis not present

## 2023-01-27 DIAGNOSIS — I493 Ventricular premature depolarization: Secondary | ICD-10-CM | POA: Diagnosis not present

## 2023-01-27 DIAGNOSIS — R001 Bradycardia, unspecified: Secondary | ICD-10-CM | POA: Diagnosis not present

## 2023-01-27 DIAGNOSIS — I440 Atrioventricular block, first degree: Secondary | ICD-10-CM | POA: Diagnosis not present

## 2023-02-01 DIAGNOSIS — F1120 Opioid dependence, uncomplicated: Secondary | ICD-10-CM | POA: Diagnosis not present

## 2023-02-03 DIAGNOSIS — F10230 Alcohol dependence with withdrawal, uncomplicated: Secondary | ICD-10-CM | POA: Diagnosis not present

## 2023-02-03 DIAGNOSIS — F4310 Post-traumatic stress disorder, unspecified: Secondary | ICD-10-CM | POA: Diagnosis not present

## 2023-02-03 DIAGNOSIS — F32A Depression, unspecified: Secondary | ICD-10-CM | POA: Diagnosis not present

## 2023-02-03 DIAGNOSIS — F17200 Nicotine dependence, unspecified, uncomplicated: Secondary | ICD-10-CM | POA: Diagnosis not present

## 2023-02-03 DIAGNOSIS — F1120 Opioid dependence, uncomplicated: Secondary | ICD-10-CM | POA: Diagnosis not present

## 2023-02-03 DIAGNOSIS — F419 Anxiety disorder, unspecified: Secondary | ICD-10-CM | POA: Diagnosis not present

## 2023-02-03 DIAGNOSIS — F1020 Alcohol dependence, uncomplicated: Secondary | ICD-10-CM | POA: Diagnosis not present

## 2023-02-06 DIAGNOSIS — F1120 Opioid dependence, uncomplicated: Secondary | ICD-10-CM | POA: Diagnosis not present

## 2023-02-06 DIAGNOSIS — F17210 Nicotine dependence, cigarettes, uncomplicated: Secondary | ICD-10-CM | POA: Diagnosis not present

## 2023-02-06 DIAGNOSIS — F32A Depression, unspecified: Secondary | ICD-10-CM | POA: Diagnosis not present

## 2023-02-06 DIAGNOSIS — R45851 Suicidal ideations: Secondary | ICD-10-CM | POA: Diagnosis not present

## 2023-02-06 DIAGNOSIS — F39 Unspecified mood [affective] disorder: Secondary | ICD-10-CM | POA: Diagnosis not present

## 2023-02-06 DIAGNOSIS — F1020 Alcohol dependence, uncomplicated: Secondary | ICD-10-CM | POA: Diagnosis not present

## 2023-02-06 DIAGNOSIS — F1320 Sedative, hypnotic or anxiolytic dependence, uncomplicated: Secondary | ICD-10-CM | POA: Diagnosis not present

## 2023-02-06 DIAGNOSIS — S299XXA Unspecified injury of thorax, initial encounter: Secondary | ICD-10-CM | POA: Diagnosis not present

## 2023-02-06 DIAGNOSIS — F10129 Alcohol abuse with intoxication, unspecified: Secondary | ICD-10-CM | POA: Diagnosis not present

## 2023-02-06 DIAGNOSIS — F1910 Other psychoactive substance abuse, uncomplicated: Secondary | ICD-10-CM | POA: Diagnosis not present

## 2023-02-07 DIAGNOSIS — F1120 Opioid dependence, uncomplicated: Secondary | ICD-10-CM | POA: Diagnosis not present

## 2023-02-07 DIAGNOSIS — S299XXA Unspecified injury of thorax, initial encounter: Secondary | ICD-10-CM | POA: Diagnosis not present

## 2023-02-07 DIAGNOSIS — F32A Depression, unspecified: Secondary | ICD-10-CM | POA: Diagnosis not present

## 2023-02-07 DIAGNOSIS — F39 Unspecified mood [affective] disorder: Secondary | ICD-10-CM | POA: Diagnosis not present

## 2023-02-07 DIAGNOSIS — F1320 Sedative, hypnotic or anxiolytic dependence, uncomplicated: Secondary | ICD-10-CM | POA: Diagnosis not present

## 2023-02-07 DIAGNOSIS — F1020 Alcohol dependence, uncomplicated: Secondary | ICD-10-CM | POA: Diagnosis not present

## 2023-02-08 ENCOUNTER — Encounter: Payer: 59 | Admitting: Internal Medicine

## 2023-02-08 DIAGNOSIS — F1020 Alcohol dependence, uncomplicated: Secondary | ICD-10-CM | POA: Diagnosis not present

## 2023-02-08 DIAGNOSIS — F1320 Sedative, hypnotic or anxiolytic dependence, uncomplicated: Secondary | ICD-10-CM | POA: Diagnosis not present

## 2023-02-08 DIAGNOSIS — F1120 Opioid dependence, uncomplicated: Secondary | ICD-10-CM | POA: Diagnosis not present

## 2023-02-08 DIAGNOSIS — F39 Unspecified mood [affective] disorder: Secondary | ICD-10-CM | POA: Diagnosis not present

## 2023-02-12 DIAGNOSIS — F333 Major depressive disorder, recurrent, severe with psychotic symptoms: Secondary | ICD-10-CM | POA: Diagnosis not present

## 2023-02-12 DIAGNOSIS — F1020 Alcohol dependence, uncomplicated: Secondary | ICD-10-CM | POA: Diagnosis not present

## 2023-02-13 DIAGNOSIS — R1010 Upper abdominal pain, unspecified: Secondary | ICD-10-CM | POA: Diagnosis not present

## 2023-02-13 DIAGNOSIS — R079 Chest pain, unspecified: Secondary | ICD-10-CM | POA: Diagnosis not present

## 2023-02-13 DIAGNOSIS — R0789 Other chest pain: Secondary | ICD-10-CM | POA: Diagnosis not present

## 2023-02-13 DIAGNOSIS — Z743 Need for continuous supervision: Secondary | ICD-10-CM | POA: Diagnosis not present

## 2023-02-20 DIAGNOSIS — F333 Major depressive disorder, recurrent, severe with psychotic symptoms: Secondary | ICD-10-CM | POA: Diagnosis not present

## 2023-02-20 DIAGNOSIS — F1020 Alcohol dependence, uncomplicated: Secondary | ICD-10-CM | POA: Diagnosis not present

## 2023-02-23 DIAGNOSIS — F333 Major depressive disorder, recurrent, severe with psychotic symptoms: Secondary | ICD-10-CM | POA: Diagnosis not present

## 2023-02-23 DIAGNOSIS — F1020 Alcohol dependence, uncomplicated: Secondary | ICD-10-CM | POA: Diagnosis not present

## 2023-02-24 DIAGNOSIS — F1020 Alcohol dependence, uncomplicated: Secondary | ICD-10-CM | POA: Diagnosis not present

## 2023-02-24 DIAGNOSIS — F333 Major depressive disorder, recurrent, severe with psychotic symptoms: Secondary | ICD-10-CM | POA: Diagnosis not present

## 2023-02-25 DIAGNOSIS — F1020 Alcohol dependence, uncomplicated: Secondary | ICD-10-CM | POA: Diagnosis not present

## 2023-02-25 DIAGNOSIS — F333 Major depressive disorder, recurrent, severe with psychotic symptoms: Secondary | ICD-10-CM | POA: Diagnosis not present

## 2023-02-26 DIAGNOSIS — F333 Major depressive disorder, recurrent, severe with psychotic symptoms: Secondary | ICD-10-CM | POA: Diagnosis not present

## 2023-02-26 DIAGNOSIS — F1020 Alcohol dependence, uncomplicated: Secondary | ICD-10-CM | POA: Diagnosis not present

## 2023-02-28 DIAGNOSIS — F1020 Alcohol dependence, uncomplicated: Secondary | ICD-10-CM | POA: Diagnosis not present

## 2023-02-28 DIAGNOSIS — F333 Major depressive disorder, recurrent, severe with psychotic symptoms: Secondary | ICD-10-CM | POA: Diagnosis not present

## 2023-03-05 DIAGNOSIS — F1020 Alcohol dependence, uncomplicated: Secondary | ICD-10-CM | POA: Diagnosis not present

## 2023-03-06 DIAGNOSIS — F99 Mental disorder, not otherwise specified: Secondary | ICD-10-CM | POA: Diagnosis not present

## 2023-03-06 DIAGNOSIS — F1120 Opioid dependence, uncomplicated: Secondary | ICD-10-CM | POA: Diagnosis not present

## 2023-03-06 DIAGNOSIS — F1020 Alcohol dependence, uncomplicated: Secondary | ICD-10-CM | POA: Diagnosis not present

## 2023-03-07 DIAGNOSIS — R45851 Suicidal ideations: Secondary | ICD-10-CM | POA: Diagnosis not present

## 2023-03-07 DIAGNOSIS — F99 Mental disorder, not otherwise specified: Secondary | ICD-10-CM | POA: Diagnosis not present

## 2023-03-07 DIAGNOSIS — R5383 Other fatigue: Secondary | ICD-10-CM | POA: Diagnosis not present

## 2023-03-07 DIAGNOSIS — R6883 Chills (without fever): Secondary | ICD-10-CM | POA: Diagnosis not present

## 2023-03-07 DIAGNOSIS — F1120 Opioid dependence, uncomplicated: Secondary | ICD-10-CM | POA: Diagnosis not present

## 2023-03-07 DIAGNOSIS — F1020 Alcohol dependence, uncomplicated: Secondary | ICD-10-CM | POA: Diagnosis not present

## 2023-03-07 DIAGNOSIS — F32A Depression, unspecified: Secondary | ICD-10-CM | POA: Diagnosis not present

## 2023-03-30 DIAGNOSIS — F4310 Post-traumatic stress disorder, unspecified: Secondary | ICD-10-CM | POA: Diagnosis not present

## 2023-03-30 DIAGNOSIS — F1120 Opioid dependence, uncomplicated: Secondary | ICD-10-CM | POA: Diagnosis not present

## 2023-03-30 DIAGNOSIS — F17200 Nicotine dependence, unspecified, uncomplicated: Secondary | ICD-10-CM | POA: Diagnosis not present

## 2023-03-30 DIAGNOSIS — F419 Anxiety disorder, unspecified: Secondary | ICD-10-CM | POA: Diagnosis not present

## 2023-03-30 DIAGNOSIS — F1020 Alcohol dependence, uncomplicated: Secondary | ICD-10-CM | POA: Diagnosis not present

## 2023-03-30 DIAGNOSIS — F32A Depression, unspecified: Secondary | ICD-10-CM | POA: Diagnosis not present

## 2023-03-31 DIAGNOSIS — F17200 Nicotine dependence, unspecified, uncomplicated: Secondary | ICD-10-CM | POA: Diagnosis not present

## 2023-03-31 DIAGNOSIS — F419 Anxiety disorder, unspecified: Secondary | ICD-10-CM | POA: Diagnosis not present

## 2023-03-31 DIAGNOSIS — F32A Depression, unspecified: Secondary | ICD-10-CM | POA: Diagnosis not present

## 2023-03-31 DIAGNOSIS — F1020 Alcohol dependence, uncomplicated: Secondary | ICD-10-CM | POA: Diagnosis not present

## 2023-03-31 DIAGNOSIS — F1120 Opioid dependence, uncomplicated: Secondary | ICD-10-CM | POA: Diagnosis not present

## 2023-03-31 DIAGNOSIS — F4310 Post-traumatic stress disorder, unspecified: Secondary | ICD-10-CM | POA: Diagnosis not present

## 2023-04-01 DIAGNOSIS — F32A Depression, unspecified: Secondary | ICD-10-CM | POA: Diagnosis not present

## 2023-04-01 DIAGNOSIS — F17200 Nicotine dependence, unspecified, uncomplicated: Secondary | ICD-10-CM | POA: Diagnosis not present

## 2023-04-01 DIAGNOSIS — F1120 Opioid dependence, uncomplicated: Secondary | ICD-10-CM | POA: Diagnosis not present

## 2023-04-01 DIAGNOSIS — F1020 Alcohol dependence, uncomplicated: Secondary | ICD-10-CM | POA: Diagnosis not present

## 2023-04-01 DIAGNOSIS — F4310 Post-traumatic stress disorder, unspecified: Secondary | ICD-10-CM | POA: Diagnosis not present

## 2023-04-01 DIAGNOSIS — F419 Anxiety disorder, unspecified: Secondary | ICD-10-CM | POA: Diagnosis not present

## 2023-04-02 DIAGNOSIS — F419 Anxiety disorder, unspecified: Secondary | ICD-10-CM | POA: Diagnosis not present

## 2023-04-02 DIAGNOSIS — F17200 Nicotine dependence, unspecified, uncomplicated: Secondary | ICD-10-CM | POA: Diagnosis not present

## 2023-04-02 DIAGNOSIS — F1020 Alcohol dependence, uncomplicated: Secondary | ICD-10-CM | POA: Diagnosis not present

## 2023-04-02 DIAGNOSIS — F1120 Opioid dependence, uncomplicated: Secondary | ICD-10-CM | POA: Diagnosis not present

## 2023-04-02 DIAGNOSIS — F32A Depression, unspecified: Secondary | ICD-10-CM | POA: Diagnosis not present

## 2023-04-02 DIAGNOSIS — F4310 Post-traumatic stress disorder, unspecified: Secondary | ICD-10-CM | POA: Diagnosis not present

## 2023-04-03 DIAGNOSIS — F419 Anxiety disorder, unspecified: Secondary | ICD-10-CM | POA: Diagnosis not present

## 2023-04-03 DIAGNOSIS — F1020 Alcohol dependence, uncomplicated: Secondary | ICD-10-CM | POA: Diagnosis not present

## 2023-04-03 DIAGNOSIS — F4310 Post-traumatic stress disorder, unspecified: Secondary | ICD-10-CM | POA: Diagnosis not present

## 2023-04-03 DIAGNOSIS — F17200 Nicotine dependence, unspecified, uncomplicated: Secondary | ICD-10-CM | POA: Diagnosis not present

## 2023-04-03 DIAGNOSIS — F1120 Opioid dependence, uncomplicated: Secondary | ICD-10-CM | POA: Diagnosis not present

## 2023-04-03 DIAGNOSIS — F32A Depression, unspecified: Secondary | ICD-10-CM | POA: Diagnosis not present

## 2023-04-04 DIAGNOSIS — F17200 Nicotine dependence, unspecified, uncomplicated: Secondary | ICD-10-CM | POA: Diagnosis not present

## 2023-04-04 DIAGNOSIS — F1020 Alcohol dependence, uncomplicated: Secondary | ICD-10-CM | POA: Diagnosis not present

## 2023-04-04 DIAGNOSIS — F419 Anxiety disorder, unspecified: Secondary | ICD-10-CM | POA: Diagnosis not present

## 2023-04-04 DIAGNOSIS — F1120 Opioid dependence, uncomplicated: Secondary | ICD-10-CM | POA: Diagnosis not present

## 2023-04-04 DIAGNOSIS — F4310 Post-traumatic stress disorder, unspecified: Secondary | ICD-10-CM | POA: Diagnosis not present

## 2023-04-04 DIAGNOSIS — F32A Depression, unspecified: Secondary | ICD-10-CM | POA: Diagnosis not present

## 2023-04-05 DIAGNOSIS — F17200 Nicotine dependence, unspecified, uncomplicated: Secondary | ICD-10-CM | POA: Diagnosis not present

## 2023-04-05 DIAGNOSIS — F1020 Alcohol dependence, uncomplicated: Secondary | ICD-10-CM | POA: Diagnosis not present

## 2023-04-05 DIAGNOSIS — F1120 Opioid dependence, uncomplicated: Secondary | ICD-10-CM | POA: Diagnosis not present

## 2023-04-05 DIAGNOSIS — F4310 Post-traumatic stress disorder, unspecified: Secondary | ICD-10-CM | POA: Diagnosis not present

## 2023-04-05 DIAGNOSIS — F32A Depression, unspecified: Secondary | ICD-10-CM | POA: Diagnosis not present

## 2023-04-05 DIAGNOSIS — F419 Anxiety disorder, unspecified: Secondary | ICD-10-CM | POA: Diagnosis not present

## 2023-04-06 DIAGNOSIS — F1120 Opioid dependence, uncomplicated: Secondary | ICD-10-CM | POA: Diagnosis not present

## 2023-04-06 DIAGNOSIS — F32A Depression, unspecified: Secondary | ICD-10-CM | POA: Diagnosis not present

## 2023-04-06 DIAGNOSIS — F17200 Nicotine dependence, unspecified, uncomplicated: Secondary | ICD-10-CM | POA: Diagnosis not present

## 2023-04-06 DIAGNOSIS — F4310 Post-traumatic stress disorder, unspecified: Secondary | ICD-10-CM | POA: Diagnosis not present

## 2023-04-06 DIAGNOSIS — F1020 Alcohol dependence, uncomplicated: Secondary | ICD-10-CM | POA: Diagnosis not present

## 2023-04-06 DIAGNOSIS — F419 Anxiety disorder, unspecified: Secondary | ICD-10-CM | POA: Diagnosis not present

## 2023-04-15 ENCOUNTER — Other Ambulatory Visit: Payer: Self-pay | Admitting: Student

## 2023-04-15 ENCOUNTER — Ambulatory Visit (INDEPENDENT_AMBULATORY_CARE_PROVIDER_SITE_OTHER): Payer: 59 | Admitting: Student

## 2023-04-15 ENCOUNTER — Other Ambulatory Visit: Payer: Self-pay

## 2023-04-15 VITALS — BP 131/68 | HR 95 | Temp 98.5°F | Ht 71.0 in | Wt 173.8 lb

## 2023-04-15 DIAGNOSIS — F1129 Opioid dependence with unspecified opioid-induced disorder: Secondary | ICD-10-CM

## 2023-04-15 DIAGNOSIS — F1090 Alcohol use, unspecified, uncomplicated: Secondary | ICD-10-CM

## 2023-04-15 DIAGNOSIS — F332 Major depressive disorder, recurrent severe without psychotic features: Secondary | ICD-10-CM | POA: Diagnosis not present

## 2023-04-15 MED ORDER — BUPRENORPHINE HCL-NALOXONE HCL 8-2 MG SL FILM: 1.0000 | ORAL_FILM | Freq: Three times a day (TID) | SUBLINGUAL | 0 refills | Status: DC

## 2023-04-15 NOTE — Assessment & Plan Note (Signed)
Patient was previously taking 50 mg of Zoloft but noted that he has stopped taking this medication.  He was started on Seroquel 100 mg at night during his stay at the [facility].  Patient notes improvement in his mood.  He denies any SI or HI.  He is currently living in a sober living house and started a job and notes overall improvement - Continue Seroquel 100 mg at night

## 2023-04-15 NOTE — Patient Instructions (Signed)
Thank you so much for coming to the clinic today!   We have put in a prescription for the Suboxone. Please follow-up with [place] in two weeks for further management.   If you have any questions please feel free to the call the clinic at anytime at [phone]. It was a pleasure seeing you!  Best, Dr. Rayvon Char

## 2023-04-15 NOTE — Assessment & Plan Note (Signed)
Patient was previously on methadone therapy.  It was noted on 10/24 that he was having issues with getting his methadone prescription improved in the context of his alcohol use disorder.  He recently completed detoxification at [facility].  He is currently taking 24 mg of Subutex.  He continues to endorse some cravings, body aches, and rhinorrhea.  Patient was counseled on appropriate intake of medication.  He also signed his pain contract.  Patient is agreeable for [facility] to be primary provider for his Suboxone. - Follow-up in 2 weeks

## 2023-04-15 NOTE — Assessment & Plan Note (Signed)
>>  ASSESSMENT AND PLAN FOR OPIATE DEPENDENCE (HCC) WRITTEN ON 04/15/2023 11:41 AM BY Morrie Sheldon, MD  Patient was previously on methadone therapy.  It was noted on 10/24 that he was having issues with getting his methadone prescription improved in the context of his alcohol use disorder.  He recently completed detoxification at [facility].  He is currently taking 24 mg of Subutex.  He continues to endorse some cravings, body aches, and rhinorrhea.  Patient was counseled on appropriate intake of medication.  He also signed his pain contract.  Patient is agreeable for [facility] to be primary provider for his Suboxone. - Follow-up in 2 weeks

## 2023-04-15 NOTE — Assessment & Plan Note (Signed)
>>  ASSESSMENT AND PLAN FOR MDD (MAJOR DEPRESSIVE DISORDER), RECURRENT EPISODE, SEVERE (HCC) WRITTEN ON 04/15/2023 11:40 AM BY Morrie Sheldon, MD  Patient was previously taking 50 mg of Zoloft but noted that he has stopped taking this medication.  He was started on Seroquel 100 mg at night during his stay at the [facility].  Patient notes improvement in his mood.  He denies any SI or HI.  He is currently living in a sober living house and started a job and notes overall improvement - Continue Seroquel 100 mg at night

## 2023-04-15 NOTE — Progress Notes (Signed)
CC: Opioid use disorder follow-up.   HPI: [name] is a 38 y.o. male living with a history stated below and presents today for opioid disorder follow-up. Please see problem based assessment and plan for additional details.  Past Medical History:  Diagnosis Date   Anxiety    Depression    Drug abuse (HCC)    PTSD (post-traumatic stress disorder)    Severe episode of recurrent major depressive disorder, without psychotic features (HCC)     Current Outpatient Medications on File Prior to Visit  Medication Sig Dispense Refill   QUEtiapine (SEROQUEL) 100 MG tablet Take 100 mg by mouth at bedtime.     No current facility-administered medications on file prior to visit.    Family History  Problem Relation Age of Onset   Emphysema Mother    Hepatitis C Mother    Chronic bronchitis Mother     Social History   Socioeconomic History   Marital status: Single    Spouse name: Not on file   Number of children: Not on file   Years of education: Not on file   Highest education level: Not on file  Occupational History   Not on file  Tobacco Use   Smoking status: Every Day    Current packs/day: 1.00    Types: Cigarettes   Smokeless tobacco: Current    Types: Snuff  Vaping Use   Vaping status: Never Used  Substance and Sexual Activity   Alcohol use: Yes    Comment: 3-4 gallons of liquor a week   Drug use: Not Currently    Types: Marijuana, Benzodiazepines, Heroin, Oxycodone, Hydrocodone    Comment: opiates, heroin   Sexual activity: Never  Other Topics Concern   Not on file  Social History Narrative   Not on file   Social Drivers of Health   Financial Resource Strain: Medium Risk (01/18/2023)   Overall Financial Resource Strain (CARDIA)    Difficulty of Paying Living Expenses: Somewhat hard  Food Insecurity: Food Insecurity Present (04/15/2023)   Hunger Vital Sign    Worried About Running Out of Food in the Last Year: Sometimes true    Ran Out of Food in the Last  Year: Sometimes true  Transportation Needs: Unmet Transportation Needs (04/15/2023)   PRAPARE - Transportation    Lack of Transportation (Medical): Yes    Lack of Transportation (Non-Medical): Yes  Physical Activity: Inactive (01/18/2023)   Exercise Vital Sign    Days of Exercise per Week: 0 days    Minutes of Exercise per Session: 0 min  Stress: Stress Concern Present (01/18/2023)   [employer] of Occupational Health - Occupational Stress Questionnaire    Feeling of Stress : To some extent  Social Connections: Moderately Integrated (04/15/2023)   Social Connection and Isolation Panel [NHANES]    Frequency of Communication with Friends and Family: Three times a week    Frequency of Social Gatherings with Friends and Family: Once a week    Attends Religious Services: More than 4 times per year    Active Member of [employer] or Organizations: Yes    Attends Engineer, structural: More than 4 times per year    Marital Status: Never married  Recent Concern: Social Connections - Socially Isolated (01/18/2023)   Social Connection and Isolation Panel [NHANES]    Frequency of Communication with Friends and Family: More than three times a week    Frequency of Social Gatherings with Friends and Family: Three times a week  Attends Religious Services: Never    Active Member of Clubs or Organizations: No    Attends Banker Meetings: Never    Marital Status: Never married  Intimate Partner Violence: Not At Risk (04/15/2023)   Humiliation, Afraid, Rape, and Kick questionnaire    Fear of Current or Ex-Partner: No    Emotionally Abused: No    Physically Abused: No    Sexually Abused: No    Review of Systems: ROS negative except for what is noted on the assessment and plan.  Vitals:   04/15/23 1030  BP: 131/68  Pulse: 95  Temp: 98.5 F (36.9 C)  TempSrc: Oral  SpO2: 100%  Weight: 173 lb 12.8 oz (78.8 kg)  Height: 5\' 11"  (1.803 m)    Physical  Exam: Constitutional: well-appearing in no acute distress HENT: normocephalic atraumatic, mucous membranes moist Eyes: conjunctiva non-erythematous Neck: supple Cardiovascular: regular rate and rhythm, no m/r/g Pulmonary/Chest: normal work of breathing on room air, lungs clear to auscultation bilaterally Abdominal: soft, non-tender, non-distended MSK: normal bulk and tone Neurological: alert & oriented x 3, 5/5 strength in bilateral upper and lower extremities, normal gait Skin: warm and dry  Assessment & Plan:   MDD (major depressive disorder), recurrent episode, severe (HCC) Patient was previously taking 50 mg of Zoloft but noted that he has stopped taking this medication.  He was started on Seroquel 100 mg at night during his stay at the [facility].  Patient notes improvement in his mood.  He denies any SI or HI.  He is currently living in a sober living house and started a job and notes overall improvement - Continue Seroquel 100 mg at night  Opiate dependence Patient was previously on methadone therapy.  It was noted on 10/24 that he was having issues with getting his methadone prescription improved in the context of his alcohol use disorder.  He recently completed detoxification at [facility].  He is currently taking 24 mg of Subutex.  He continues to endorse some cravings, body aches, and rhinorrhea.  Patient was counseled on appropriate intake of medication.  He also signed his pain contract.  Patient is agreeable for [facility] to be primary provider for his Suboxone. - Follow-up in 2 weeks  Alcohol use disorder Stable.  Patient has not had a drink for 65 days.  Will continue to encourage alcohol cessation.  Patient seen with Dr. Georgena Spurling, MD  [facility] Internal Medicine, PGY-1 Date 04/15/2023 Time 11:41 AM

## 2023-04-15 NOTE — Assessment & Plan Note (Signed)
Stable.  Patient has not had a drink for 65 days.  Will continue to encourage alcohol cessation.

## 2023-04-15 NOTE — Addendum Note (Signed)
Addended by: Morrie Sheldon on: 04/15/2023 11:46 AM   Modules accepted: Orders

## 2023-04-18 NOTE — Progress Notes (Signed)
 Internal Medicine Clinic Attending  I was physically present during the key portions of the resident provided service and participated in the medical decision making of patient's management care. I reviewed pertinent patient test results.  The assessment, diagnosis, and plan were formulated together and I agree with the documentation in the resident's note.  Tyson Alias, MD

## 2023-04-21 LAB — TOXASSURE SELECT,+ANTIDEPR,UR

## 2023-04-29 ENCOUNTER — Ambulatory Visit (INDEPENDENT_AMBULATORY_CARE_PROVIDER_SITE_OTHER): Payer: 59 | Admitting: Internal Medicine

## 2023-04-29 ENCOUNTER — Telehealth: Payer: Self-pay

## 2023-04-29 VITALS — BP 127/67 | HR 70 | Temp 98.4°F | Ht 71.0 in | Wt 166.7 lb

## 2023-04-29 DIAGNOSIS — F1129 Opioid dependence with unspecified opioid-induced disorder: Secondary | ICD-10-CM | POA: Diagnosis not present

## 2023-04-29 DIAGNOSIS — D7589 Other specified diseases of blood and blood-forming organs: Secondary | ICD-10-CM

## 2023-04-29 DIAGNOSIS — F1090 Alcohol use, unspecified, uncomplicated: Secondary | ICD-10-CM

## 2023-04-29 MED ORDER — BUPRENORPHINE HCL-NALOXONE HCL 8-2 MG SL FILM: 1.0000 | ORAL_FILM | Freq: Three times a day (TID) | SUBLINGUAL | 0 refills | Status: DC

## 2023-04-29 NOTE — Telephone Encounter (Signed)
Pt did not stop at the lab for her labs today or stop to make his 2 wks f/u . I did call him he stated he  thought it was a automatic appt I explained that the same thing applies  like I told him when he called earlier .Marland Kitchen He needs to stop and make his appt before leaving .Marland Kitchen Pt made his 2wk f/u for 2/10 and he also agrees to  stop at the lab to get his labs  drawn .Marland Kitchen So if you can please place the orders for future

## 2023-04-29 NOTE — Patient Instructions (Addendum)
Thank you, [name] for allowing [place] to provide your care today.  Opioid use disorder Please continue taking suboxone 3 times daily. You are on the highest dose of suboxone. Long term, if this is not controlling your craving then you may want to consider going back to a methadone clinic. I sent a refill of suboxone in today for 2 weeks. Please follow-up again at that time to talk about if you'd rather   Abnormal blood work  Some of your blood work in December was off and I think this could be related to your alcohol history. I am rechecking that today.  I have ordered the following labs for you:  Lab Orders         CBC no Diff         Vitamin B12      I have ordered the following medication/changed the following medications:   Stop the following medications: Medications Discontinued During This Encounter  Medication Reason   Buprenorphine HCl-Naloxone HCl 8-2 MG FILM      Start the following medications: Meds ordered this encounter  Medications   Buprenorphine HCl-Naloxone HCl 8-2 MG FILM    Sig: Place 1 Film under the tongue 3 (three) times daily.    Dispense:  42 each    Refill:  0     Follow up:  2 weeks    We look forward to seeing you next time. Please call our clinic at [phone] if you have any questions or concerns. The best time to call is Monday-Friday from 9am-4pm, but there is someone available 24/7. If after hours or the weekend, call the main hospital number and ask for the Internal Medicine Resident On-Call. If you need medication refills, please notify your pharmacy one week in advance and they will send [place] a request.   Thank you for trusting me with your care. Wishing you the best!   Rudene Christians, DO [facility]

## 2023-04-29 NOTE — Progress Notes (Unsigned)
Subjective:  CC: OUD follow-up  HPI:  [name] is a 38 y.o. male with a past medical history of OUD, MDD, GAD, and PTSD, who presents today for OUD follow-up. Last office visit 04/15/23. He had completed detoxification treatment at [facility] treatment center and was taking Subutex 24 mg.  Please see problem based assessment and plan for additional details.  Past Medical History:  Diagnosis Date   Anxiety    Depression    Drug abuse (HCC)    PTSD (post-traumatic stress disorder)    Severe episode of recurrent major depressive disorder, without psychotic features (HCC)     MEDICATIONS:  Suboxone 8-2 mg film TID Seroquel 100 mg qhs  Family History  Problem Relation Age of Onset   Emphysema Mother    Hepatitis C Mother    Chronic bronchitis Mother    No past surgical history on file.   Social History   Socioeconomic History   Marital status: Single    Spouse name: Not on file   Number of children: Not on file   Years of education: Not on file   Highest education level: Not on file  Occupational History   Not on file  Tobacco Use   Smoking status: Every Day    Current packs/day: 1.00    Types: Cigarettes   Smokeless tobacco: Current    Types: Snuff  Vaping Use   Vaping status: Never Used  Substance and Sexual Activity   Alcohol use: Yes    Comment: 3-4 gallons of liquor a week   Drug use: Not Currently    Types: Marijuana, Benzodiazepines, Heroin, Oxycodone, Hydrocodone    Comment: opiates, heroin   Sexual activity: Never  Other Topics Concern   Not on file  Social History Narrative   Not on file   Social Drivers of Health   Financial Resource Strain: Medium Risk (01/18/2023)   Overall Financial Resource Strain (CARDIA)    Difficulty of Paying Living Expenses: Somewhat hard  Food Insecurity: Food Insecurity Present (04/15/2023)   Hunger Vital Sign    Worried About Running Out of Food in the Last Year: Sometimes true    Ran Out of Food in the  Last Year: Sometimes true  Transportation Needs: Unmet Transportation Needs (04/15/2023)   PRAPARE - Transportation    Lack of Transportation (Medical): Yes    Lack of Transportation (Non-Medical): Yes  Physical Activity: Inactive (01/18/2023)   Exercise Vital Sign    Days of Exercise per Week: 0 days    Minutes of Exercise per Session: 0 min  Stress: Stress Concern Present (01/18/2023)   [employer] of Occupational Health - Occupational Stress Questionnaire    Feeling of Stress : To some extent  Social Connections: Moderately Integrated (04/15/2023)   Social Connection and Isolation Panel [NHANES]    Frequency of Communication with Friends and Family: Three times a week    Frequency of Social Gatherings with Friends and Family: Once a week    Attends Religious Services: More than 4 times per year    Active Member of [employer] or Organizations: Yes    Attends Engineer, structural: More than 4 times per year    Marital Status: Never married  Recent Concern: Social Connections - Socially Isolated (01/18/2023)   Social Connection and Isolation Panel [NHANES]    Frequency of Communication with Friends and Family: More than three times a week    Frequency of Social Gatherings with Friends and Family: Three times a week  Attends Religious Services: Never    Active Member of Clubs or Organizations: No    Attends Banker Meetings: Never    Marital Status: Never married  Intimate Partner Violence: Not At Risk (04/15/2023)   Humiliation, Afraid, Rape, and Kick questionnaire    Fear of Current or Ex-Partner: No    Emotionally Abused: No    Physically Abused: No    Sexually Abused: No    Review of Systems: ROS negative except for what is noted on the assessment and plan.  Objective:   Vitals:   04/29/23 1016  BP: 127/67  Pulse: 70  Temp: 98.4 F (36.9 C)  TempSrc: Oral  SpO2: 99%  Weight: 166 lb 11.2 oz (75.6 kg)  Height: 5\' 11"  (1.803 m)    Physical  Exam: Constitutional: well-appearing *** sitting in ***, in no acute distress HENT: normocephalic atraumatic, mucous membranes moist Eyes: conjunctiva non-erythematous Neck: supple Cardiovascular: regular rate and rhythm, no m/r/g Pulmonary/Chest: normal work of breathing on room air, lungs clear to auscultation bilaterally Abdominal: soft, non-tender, non-distended MSK: normal bulk and tone Neurological: alert & oriented x 3, 5/5 strength in bilateral upper and lower extremities, normal gait Skin: warm and dry Psych: ***     Assessment & Plan:  No problem-specific Assessment & Plan notes found for this encounter.    Last fill on 01/18 with suboxone 8-2 mg films TID, 42 films for 14 days. Toxassure appropriate 04/15/23.  P:     04/29/2023   10:19 AM 04/15/2023   10:40 AM 01/18/2023    9:57 AM  Depression screen PHQ 2/9  Decreased Interest 1 1 3   Down, Depressed, Hopeless 1 1 3   PHQ - 2 Score 2 2 6   Altered sleeping 0 0 3  Tired, decreased energy 0 0 3  Change in appetite 0 0 3  Feeling bad or failure about yourself  0 0 3  Trouble concentrating 0 0 3  Moving slowly or fidgety/restless 0 0 3  Suicidal thoughts 0 0 0  PHQ-9 Score 2 2 24   Difficult doing work/chores Not difficult at all Not difficult at all Extremely dIfficult      04/29/2023   10:19 AM 01/18/2023   10:00 AM  GAD 7 : Generalized Anxiety Score  Nervous, Anxious, on Edge 0 3  Control/stop worrying 2 3  Worry too much - different things 2 3  Trouble relaxing 0 3  Restless 0 3  Easily annoyed or irritable 0 2  Afraid - awful might happen 1 3  Total GAD 7 Score 5 20  Anxiety Difficulty Very difficult Extremely difficult       Patient discussed with Dr. {[id number]::"[id number]","L. Hoffman","Mullen","Narendra","Machen","Vincent","Guilloud","Lau"}   [employer], D.O. [facility] Internal Medicine  PGY-3 Pager: [phone]  Phone: [phone] Date 04/29/2023  Time 10:36 AM

## 2023-04-30 NOTE — Assessment & Plan Note (Signed)
>>  ASSESSMENT AND PLAN FOR OPIATE DEPENDENCE (HCC) WRITTEN ON 04/30/2023  8:39 AM BY MASTERS, KATIE, DO  Last fill on 01/18 with suboxone 8-2 mg films TID, 42 films for 14 days. Toxassure appropriate 04/15/23. He fills that cravings were better controlled on methadone, which he took prior to going to rehab i3 months ago. He is unsure about switching as suboxone allows more freedom with his schedule. He is living at a 1/2 house, going to AA, and is employed. Denies constipation  P: Contiue suboxone 8-2 mg TID films, higher dose unlikely to be beneficial and more likely to have side effects He will think about if he wants to switch and follow-up in 2 weeks. If he sticks with suboxone I think it is appropriate to start monthly refills at this time.

## 2023-04-30 NOTE — Assessment & Plan Note (Signed)
Stable. Has not drank in 3 months. -macrocytosis on last CBC. He does not have time to get labs today as needs to get to work but would be open to this next time.

## 2023-04-30 NOTE — Assessment & Plan Note (Signed)
Last fill on 01/18 with suboxone 8-2 mg films TID, 42 films for 14 days. Toxassure appropriate 04/15/23. He fills that cravings were better controlled on methadone, which he took prior to going to rehab i3 months ago. He is unsure about switching as suboxone allows more freedom with his schedule. He is living at a 1/2 house, going to AA, and is employed. Denies constipation  P: Contiue suboxone 8-2 mg TID films, higher dose unlikely to be beneficial and more likely to have side effects He will think about if he wants to switch and follow-up in 2 weeks. If he sticks with suboxone I think it is appropriate to start monthly refills at this time.

## 2023-05-02 NOTE — Progress Notes (Signed)
 Internal Medicine Clinic Attending  Case discussed with the resident at the time of the visit.  We reviewed the resident's history and exam and pertinent patient test results.  I agree with the assessment, diagnosis, and plan of care documented in the resident's note.

## 2023-05-11 ENCOUNTER — Encounter: Payer: 59 | Admitting: Student

## 2023-05-13 ENCOUNTER — Ambulatory Visit (INDEPENDENT_AMBULATORY_CARE_PROVIDER_SITE_OTHER): Payer: 59 | Admitting: Student

## 2023-05-13 VITALS — BP 126/64 | HR 58 | Temp 98.0°F | Wt 165.5 lb

## 2023-05-13 DIAGNOSIS — F1090 Alcohol use, unspecified, uncomplicated: Secondary | ICD-10-CM | POA: Diagnosis not present

## 2023-05-13 DIAGNOSIS — F332 Major depressive disorder, recurrent severe without psychotic features: Secondary | ICD-10-CM

## 2023-05-13 DIAGNOSIS — F1129 Opioid dependence with unspecified opioid-induced disorder: Secondary | ICD-10-CM | POA: Diagnosis not present

## 2023-05-13 MED ORDER — MIRTAZAPINE 15 MG PO TABS: 15.0000 mg | ORAL_TABLET | Freq: Every day | ORAL | 3 refills | Status: DC

## 2023-05-13 MED ORDER — BUPRENORPHINE HCL-NALOXONE HCL 8-2 MG SL FILM: 1.0000 | ORAL_FILM | Freq: Three times a day (TID) | SUBLINGUAL | 0 refills | Status: DC

## 2023-05-13 NOTE — Patient Instructions (Signed)
Thank you, [name] for allowing [place] to provide your care today.   I have ordered the following labs for you:   Lab Orders         ToxAssure Select,+Antidepr,UR       Referrals ordered today:    Referral Orders         Ambulatory referral to Integrated Behavioral Health      I have ordered the following medication/changed the following medications:   Stop the following medications: Medications Discontinued During This Encounter  Medication Reason   QUEtiapine (SEROQUEL) 100 MG tablet    Buprenorphine HCl-Naloxone HCl 8-2 MG FILM Reorder     Start the following medications: Meds ordered this encounter  Medications   mirtazapine (REMERON) 15 MG tablet    Sig: Take 1 tablet (15 mg total) by mouth at bedtime.    Dispense:  90 tablet    Refill:  3   Buprenorphine HCl-Naloxone HCl 8-2 MG FILM    Sig: Place 1 Film under the tongue 3 (three) times daily.    Dispense:  42 each    Refill:  0     Follow up:  2 weeks     Remember:   Continue taking your suboxone.   We gave you some resources for family counseling at [place]- please check them out  Referral sent for you to hear from our counselor. Will request that you want video visits. Please make sure to pick up the phone when we call you for scheduling.   Start Mirtazapine nightly, this will help your appetite too. Take consistently daily  Stop the Quetiapine, since you have not been taking it   Follow up in 2 weeks.   Should you have any questions or concerns please call the internal medicine clinic at [phone].    Manuela Neptune, MD [facility]

## 2023-05-13 NOTE — Assessment & Plan Note (Addendum)
Has a history of MDD. Described feelings of hopelessness, anhedonia, insomnia, lack of appetite where he does not eat days at a time. Has been having difficulty with his family leaving him and him only able to see his 36 yo son who he raised only once a week now. He is trying to make things work. He does have episodes where he makes impulsive decisions and will get tattoos. Denies episodes where he is having prolonged energy, hallucinations, delusions. Was taking quetiapine at bedtime, but when asked if he needed a refill he did not want one. He states that he has not been taking it and would not like it. I do think that he would benefit from a referral with behavioral health to see Marena Chancy and also starting mirtazapine nightly. Counseled him on the importance of adhering to this and being consistently taking it to help with his depression. Patient open to have video visits with [facility] and starting mirtazapine. Denies SI/HI today.  -Start mirtazapine 15mg  nightly  -See Marena Chancy, video visits.  -Stop quetiapine -Resources for [place] family counseling given

## 2023-05-13 NOTE — Assessment & Plan Note (Signed)
Last fill on 01/31 with suboxone 8-2 mg films TID, 42 films for 14 days. Toxassure appropriate 04/15/2023. He has cravings almost daily, is on the current maximal dose. He denies constipation or being too sleepy. Tox Assure sent today. Denies using any other drugs or substances. Has been sober, living in a sober house and going to groups.   -cw same  -FU in 2 weeks

## 2023-05-13 NOTE — Assessment & Plan Note (Signed)
>>  ASSESSMENT AND PLAN FOR OPIATE DEPENDENCE (HCC) WRITTEN ON 05/13/2023 12:31 PM BY Manuela Neptune, MD  Last fill on 01/31 with suboxone 8-2 mg films TID, 42 films for 14 days. Toxassure appropriate 04/15/2023. He has cravings almost daily, is on the current maximal dose. He denies constipation or being too sleepy. Tox Assure sent today. Denies using any other drugs or substances. Has been sober, living in a sober house and going to groups.   -cw same  -FU in 2 weeks

## 2023-05-13 NOTE — Assessment & Plan Note (Signed)
Would need to follow up on Macrocytosis, but has not drank alcohol in several months.   -CBC, folate, B12 at next visit.

## 2023-05-13 NOTE — Progress Notes (Signed)
CC:  Chief Complaint  Patient presents with   Medication Refill    Buprenorphine HCl-Naloxone HCl 8-2 MG FILM   HPI:  [name] is a 38 y.o. male living with a history stated below and presents today for bupenorphine refill. Please see problem based assessment and plan for additional details.  Past Medical History:  Diagnosis Date   Anxiety    Depression    Drug abuse (HCC)    PTSD (post-traumatic stress disorder)    Severe episode of recurrent major depressive disorder, without psychotic features (HCC)     No current outpatient medications on file prior to visit.   No current facility-administered medications on file prior to visit.    Family History  Problem Relation Age of Onset   Emphysema Mother    Hepatitis C Mother    Chronic bronchitis Mother     Social History   Socioeconomic History   Marital status: Single    Spouse name: Not on file   Number of children: Not on file   Years of education: Not on file   Highest education level: Not on file  Occupational History   Not on file  Tobacco Use   Smoking status: Every Day    Current packs/day: 1.00    Types: Cigarettes   Smokeless tobacco: Current    Types: Snuff  Vaping Use   Vaping status: Never Used  Substance and Sexual Activity   Alcohol use: Yes    Comment: 3-4 gallons of liquor a week   Drug use: Not Currently    Types: Marijuana, Benzodiazepines, Heroin, Oxycodone, Hydrocodone    Comment: opiates, heroin   Sexual activity: Never  Other Topics Concern   Not on file  Social History Narrative   Not on file   Social Drivers of Health   Financial Resource Strain: Medium Risk (01/18/2023)   Overall Financial Resource Strain (CARDIA)    Difficulty of Paying Living Expenses: Somewhat hard  Food Insecurity: Food Insecurity Present (04/15/2023)   Hunger Vital Sign    Worried About Running Out of Food in the Last Year: Sometimes true    Ran Out of Food in the Last Year: Sometimes true   Transportation Needs: Unmet Transportation Needs (04/15/2023)   PRAPARE - Transportation    Lack of Transportation (Medical): Yes    Lack of Transportation (Non-Medical): Yes  Physical Activity: Inactive (01/18/2023)   Exercise Vital Sign    Days of Exercise per Week: 0 days    Minutes of Exercise per Session: 0 min  Stress: Stress Concern Present (01/18/2023)   [employer] of Occupational Health - Occupational Stress Questionnaire    Feeling of Stress : To some extent  Social Connections: Moderately Integrated (04/15/2023)   Social Connection and Isolation Panel [NHANES]    Frequency of Communication with Friends and Family: Three times a week    Frequency of Social Gatherings with Friends and Family: Once a week    Attends Religious Services: More than 4 times per year    Active Member of [employer] or Organizations: Yes    Attends Engineer, structural: More than 4 times per year    Marital Status: Never married  Recent Concern: Social Connections - Socially Isolated (01/18/2023)   Social Connection and Isolation Panel [NHANES]    Frequency of Communication with Friends and Family: More than three times a week    Frequency of Social Gatherings with Friends and Family: Three times a week    Attends Religious  Services: Never    Active Member of Clubs or Organizations: No    Attends Banker Meetings: Never    Marital Status: Never married  Intimate Partner Violence: Not At Risk (04/15/2023)   Humiliation, Afraid, Rape, and Kick questionnaire    Fear of Current or Ex-Partner: No    Emotionally Abused: No    Physically Abused: No    Sexually Abused: No    Review of Systems: ROS negative except for what is noted on the assessment and plan.  Vitals:   05/13/23 1031  BP: 126/64  Pulse: (!) 58  Temp: 98 F (36.7 C)  TempSrc: Oral  SpO2: 100%  Weight: 165 lb 8 oz (75.1 kg)    Physical Exam: Constitutional: well appearing in NAD HENT: normocephalic  atraumatic, mucous membranes moist Eyes: conjunctiva non-erythematous Cardiovascular: regular rate and rhythm, no m/r/g Pulmonary/Chest: normal work of breathing on room air, lungs clear to auscultation bilaterally Abdominal: soft, non-tender, non-distended MSK: normal bulk and tone Neurological: alert & oriented x 3, no focal deficit Skin: warm and dry Psych: normal mood and behavior  Assessment & Plan:   Patient discussed with Dr. Mayford Knife  Opiate dependence Last fill on 01/31 with suboxone 8-2 mg films TID, 42 films for 14 days. Toxassure appropriate 04/15/2023. He has cravings almost daily, is on the current maximal dose. He denies constipation or being too sleepy. Tox Assure sent today. Denies using any other drugs or substances. Has been sober, living in a sober house and going to groups.   -cw same  -FU in 2 weeks  Alcohol use disorder Would need to follow up on Macrocytosis, but has not drank alcohol in several months.   -CBC, folate, B12 at next visit.   MDD (major depressive disorder), recurrent episode, severe (HCC) Has a history of MDD. Described feelings of hopelessness, anhedonia, insomnia, lack of appetite where he does not eat days at a time. Has been having difficulty with his family leaving him and him only able to see his 21 yo son who he raised only once a week now. He is trying to make things work. He does have episodes where he makes impulsive decisions and will get tattoos. Denies episodes where he is having prolonged energy, hallucinations, delusions. Was taking quetiapine at bedtime, but when asked if he needed a refill he did not want one. He states that he has not been taking it and would not like it. I do think that he would benefit from a referral with behavioral health to see Marena Chancy and also starting mirtazapine nightly. Counseled him on the importance of adhering to this and being consistently taking it to help with his depression. Patient open to have video  visits with [facility] and starting mirtazapine. Denies SI/HI today.  -Start mirtazapine 15mg  nightly  -See Marena Chancy, video visits.  -Stop quetiapine -Resources for [place] family counseling given   Manuela Neptune, MD [facility] Internal Medicine, PGY-1 Phone: [phone] Date 05/13/2023 Time 12:37 PM

## 2023-05-13 NOTE — Assessment & Plan Note (Signed)
>>  ASSESSMENT AND PLAN FOR MDD (MAJOR DEPRESSIVE DISORDER), RECURRENT EPISODE, SEVERE (HCC) WRITTEN ON 05/13/2023 12:37 PM BY Manuela Neptune, MD  Has a history of MDD. Described feelings of hopelessness, anhedonia, insomnia, lack of appetite where he does not eat days at a time. Has been having difficulty with his family leaving him and him only able to see his 36 yo son who he raised only once a week now. He is trying to make things work. He does have episodes where he makes impulsive decisions and will get tattoos. Denies episodes where he is having prolonged energy, hallucinations, delusions. Was taking quetiapine at bedtime, but when asked if he needed a refill he did not want one. He states that he has not been taking it and would not like it. I do think that he would benefit from a referral with behavioral health to see Marena Chancy and also starting mirtazapine nightly. Counseled him on the importance of adhering to this and being consistently taking it to help with his depression. Patient open to have video visits with [facility] and starting mirtazapine. Denies SI/HI today.  -Start mirtazapine 15mg  nightly  -See Marena Chancy, video visits.  -Stop quetiapine -Resources for [place] family counseling given

## 2023-05-17 LAB — TOXASSURE SELECT,+ANTIDEPR,UR

## 2023-05-17 NOTE — Progress Notes (Signed)
 Tox assure with expected results. Pt to follow up in 2 weeks

## 2023-05-17 NOTE — Addendum Note (Signed)
 Addended by: Manuela Neptune on: 05/17/2023 11:27 AM   Modules accepted: Orders

## 2023-05-18 ENCOUNTER — Telehealth: Payer: Self-pay | Admitting: *Deleted

## 2023-05-18 NOTE — Progress Notes (Unsigned)
 Complex Care Management Note Care Guide Note  05/18/2023 Name: Mason Jones MRN: [id number] DOB: Jul 19, 1985   Complex Care Management Outreach Attempts: An unsuccessful telephone outreach was attempted today to offer the patient information about available complex care management services.  Follow Up Plan:  Additional outreach attempts will be made to offer the patient complex care management information and services.   Encounter Outcome:  Patient Request to Call Back  Gwenevere Ghazi  [facility] Health  [facility], [facility] Guide  Direct Dial: [phone]  Fax [phone]

## 2023-05-20 ENCOUNTER — Encounter: Payer: 59 | Admitting: Student

## 2023-05-20 NOTE — Progress Notes (Signed)
 Complex Care Management Note  Care Guide Note 05/20/2023 Name: [name] MRN: [id number] DOB: May 18, 1985  [name] is a 38 y.o. year old male who sees [name], DO for primary care. I reached out to [name] by phone today to offer complex care management services.  [name] was given information about Complex Care Management services today including:   The Complex Care Management services include support from the care team which includes your Nurse Care Manager, Clinical Social Worker, or Pharmacist.  The Complex Care Management team is here to help remove barriers to the health concerns and goals most important to you. Complex Care Management services are voluntary, and the patient may decline or stop services at any time by request to their care team member.   Complex Care Management Consent Status: Patient agreed to services and verbal consent obtained.   Follow up plan:  Telephone appointment with complex care management team member scheduled for:  2/24  Encounter Outcome:  Patient Scheduled  Gwenevere Ghazi  [facility] Health  [facility], [facility] Guide  Direct Dial: [phone]  Fax [phone]

## 2023-05-23 ENCOUNTER — Ambulatory Visit: Payer: Self-pay | Admitting: Licensed Clinical Social Worker

## 2023-05-23 NOTE — Progress Notes (Signed)
 Internal Medicine Clinic Attending  Case discussed with the resident at the time of the visit.  We reviewed the resident's history and exam and pertinent patient test results.  I agree with the assessment, diagnosis, and plan of care documented in the resident's note.

## 2023-05-23 NOTE — Patient Outreach (Signed)
 Care Coordination   05/23/2023 Name: Mason Jones MRN: [id number] DOB: [birth year]   Care Coordination Outreach Attempts:  An unsuccessful telephone outreach was attempted today to offer the patient information about available complex care management services.  Follow Up Plan:  Additional outreach attempts will be made to offer the patient complex care management information and services.   Encounter Outcome:  No Answer   Care Coordination Interventions:  No, not indicated    Kenton Kingfisher, LCSW [place], [facility] Health Licensed Clinical Social Worker Care Coordinator [phone]

## 2023-05-26 ENCOUNTER — Ambulatory Visit: Payer: Self-pay | Admitting: Licensed Clinical Social Worker

## 2023-05-26 ENCOUNTER — Telehealth: Payer: Self-pay | Admitting: Licensed Clinical Social Worker

## 2023-05-26 NOTE — Patient Instructions (Signed)
 Visit Information  Thank you for taking time to visit with me today. Please don't hesitate to contact me if I can be of assistance to you.   Following are the goals we discussed today:   Goals Addressed             This Visit's Progress    Care Coordination       Care Coordination Interventions: Assessed Social Determinants of Health Reviewed all upcoming appointments in Epic system Motivational Interviewing employed Solution-Focused Strategies employed:  Active listening / Reflection utilized  Emotional Support Provided Problem Solving /Task Center strategies reviewed Contact [facility] main care coordination line to connect with case worker. Contact [employer] career services to schedule appt for possible classes/career paths Continue attending AA/NA courses Reach out for help with cravings and monitor for triggers.          Our next appointment is by telephone on 06/09/2023.  Please call the care guide team at [phone] if you need to cancel or reschedule your appointment.   If you are experiencing a Mental Health or Behavioral Health Crisis or need someone to talk to, please call the Suicide and Crisis Lifeline: 988  Patient verbalizes understanding of instructions and care plan provided today and agrees to view in MyChart. Active MyChart status and patient understanding of how to access instructions and care plan via MyChart confirmed with patient.     Telephone follow up appointment with care management team member scheduled for: 06/09/2023

## 2023-05-26 NOTE — Patient Outreach (Signed)
 Care Coordination   05/26/2023 Name: Mason Jones MRN: [id number] DOB: January 31, 1986   Care Coordination Outreach Attempts:  A second unsuccessful outreach was attempted today to offer the patient with information about available complex care management services.  Follow Up Plan:  Additional outreach attempts will be made to offer the patient complex care management information and services.   Encounter Outcome:  No Answer   Care Coordination Interventions:  No, not indicated    Kenton Kingfisher, LCSW [place], [facility] Health Licensed Clinical Social Worker Care Coordinator [phone]

## 2023-05-26 NOTE — Patient Outreach (Signed)
 Care Coordination   Initial Visit Note   05/26/2023 Name: Mason Jones MRN: [id number] DOB: [birth year]  Mason Jones is a 38 y.o. year old male who sees Mason Morel, DO for primary care. I spoke with  Mason Jones by phone today.  What matters to the patients health and wellness today?  Continue to maintain sobriety and improve living situation.    Goals Addressed             This Visit's Progress    Care Coordination       Care Coordination Interventions: Assessed Social Determinants of Health Reviewed all upcoming appointments in Epic system Motivational Interviewing employed Solution-Focused Strategies employed:  Active listening / Reflection utilized  Emotional Support Provided Problem Solving /Task Center strategies reviewed Contact Mason Jones main care coordination line to connect with case worker. Contact [facility] career services to schedule appt for possible classes/career paths Continue attending AA/NA courses Reach out for help with cravings and monitor for triggers.          SDOH assessments and interventions completed:  Yes  SDOH Interventions Today    Flowsheet Row Most Recent Value  SDOH Interventions   Food Insecurity Interventions Intervention Not Indicated  Housing Interventions Intervention Not Indicated  Transportation Interventions Intervention Not Indicated  Utilities Interventions Intervention Not Indicated        Care Coordination Interventions:  Yes, provided   Interventions Today    Flowsheet Row Most Recent Value  Chronic Disease   Chronic disease during today's visit Other  [Substance Use - Remission]  General Interventions   General Interventions Discussed/Reviewed [employer], General Interventions Discussed  [Discussed [facility] - pt was seen by case worker when he initially moved to [facility] - has not seen case worker since despite reaching out to her. Gave main contact line and pt agreed to reach out to that. Pt reports [facility] Jones  housing is going well]  Education Interventions   Education Provided Provided Education  [Pt reports he would like to further his career - we discussed different career paths through [facility] Jones and CSW explained process for setting up appt with career services. Pt agreed to reach out.]  Mental Health Interventions   Mental Health Discussed/Reviewed Mental Health Discussed, Mental Health Reviewed, Coping Strategies, Anxiety  [Pt reported he is maintaining his sobriety well - attending NA/AA classes daily and receiving support through his [facility] Jones house. Reviewed his triggers and reasons for staying sober - Also discussed his future goals]        Follow up plan: Follow up call scheduled for 06/09/2023    Encounter Outcome:  Patient Visit Completed   Mason Kingfisher, LCSW [facility], [facility] Health Licensed Clinical Social Worker Care Coordinator [phone]

## 2023-05-27 ENCOUNTER — Other Ambulatory Visit: Payer: Self-pay

## 2023-05-27 ENCOUNTER — Ambulatory Visit (INDEPENDENT_AMBULATORY_CARE_PROVIDER_SITE_OTHER): Payer: 59 | Admitting: Student

## 2023-05-27 VITALS — BP 103/53 | HR 59 | Temp 98.5°F | Resp 24 | Ht 71.0 in | Wt 159.4 lb

## 2023-05-27 DIAGNOSIS — F332 Major depressive disorder, recurrent severe without psychotic features: Secondary | ICD-10-CM

## 2023-05-27 DIAGNOSIS — F339 Major depressive disorder, recurrent, unspecified: Secondary | ICD-10-CM | POA: Diagnosis not present

## 2023-05-27 DIAGNOSIS — B1920 Unspecified viral hepatitis C without hepatic coma: Secondary | ICD-10-CM | POA: Diagnosis not present

## 2023-05-27 DIAGNOSIS — F1121 Opioid dependence, in remission: Secondary | ICD-10-CM | POA: Diagnosis not present

## 2023-05-27 DIAGNOSIS — L989 Disorder of the skin and subcutaneous tissue, unspecified: Secondary | ICD-10-CM | POA: Insufficient documentation

## 2023-05-27 MED ORDER — MIRTAZAPINE 30 MG PO TABS: 30.0000 mg | ORAL_TABLET | Freq: Every day | ORAL | 3 refills | Status: AC

## 2023-05-27 NOTE — Progress Notes (Signed)
   CC: Routine Follow Up for OUD and MDD after last office visit 05/13/2023  HPI:  Mason Jones is a 38 y.o. male with pertinent PMH of PTSD with MDD/GAD, polysubstance use disorder, alcohol use disorder, opiate use disorder on Suboxone, and hepatitis C who presents as above. Please see assessment and plan below for further details.  Review of Systems:   Pertinent items noted in HPI and/or A&P.  Physical Exam:  Vitals:   05/27/23 0953  BP: (!) 103/53  Pulse: (!) 59  Resp: (!) 24  Temp: 98.5 F (36.9 C)  TempSrc: Oral  SpO2: 100%  Weight: 159 lb 6.4 oz (72.3 kg)  Height: 5\' 11"  (1.803 m)    Constitutional: Well-appearing adult male. In no acute distress. HEENT: Normocephalic, atraumatic, Sclera non-icteric, PERRL, EOM intact Pulm: Normal work of breathing on room air. ZOX:[id number] for extremity edema. Skin:Warm and dry. Neuro:Alert and oriented x3. No focal deficit noted. Psych:Pleasant mood and affect.   Assessment & Plan:   Major depression, recurrent (HCC) Patient was started on mirtazapine 15 mg nightly at his last visit and has since met with a counselor.  He has seen some improvement on the mirtazapine and it is helped with his sleep but he does think that there is still room for more improvement.  He has another counseling appointment in about 2 weeks. - Increase mirtazapine to 30 mg nightly and continue with planned counseling  Opioid dependence (HCC) Patient continues on Suboxone 8-2 mg 3 films once daily with benefit.  Dr. Jodelle Red have been appropriate with his last 04/15/2023. - Continue Suboxone 8-2 mg 3 films once daily  Hepatitis C virus infection without hepatic coma Patient with history of IV drug use found to have positive hepatitis C with high viral load.  He has not been genotype yet. - Follow-up with Dr. Ninetta Lights for genotyping and treatment for hepatitis C  Hand lesion Firm bump over the proximal dorsal third metacarpal carpal joint consistent with prior  fracture and healed bone.  No current symptoms.     Patient discussed with Dr. Theodosia Paling, DO [facility] Internal Medicine Resident PGY-2 Clinic Phone: [phone] Pager: [phone]

## 2023-05-27 NOTE — Assessment & Plan Note (Signed)
 Patient continues on Suboxone 8-2 mg 3 films once daily with benefit.  Dr. Jodelle Red have been appropriate with his last 04/15/2023. - Continue Suboxone 8-2 mg 3 films once daily

## 2023-05-27 NOTE — Assessment & Plan Note (Signed)
 Patient was started on mirtazapine 15 mg nightly at his last visit and has since met with a counselor.  He has seen some improvement on the mirtazapine and it is helped with his sleep but he does think that there is still room for more improvement.  He has another counseling appointment in about 2 weeks. - Increase mirtazapine to 30 mg nightly and continue with planned counseling

## 2023-05-27 NOTE — Patient Instructions (Signed)
  Thank you, [name], for allowing [place] to provide your care today.  I am glad that everything is going pretty well and that you are able to speak to the counselor recently.  I would like to increase your mirtazapine to 30 mg nightly and see if this helps with your symptoms.  If you have any issues on this dose please let us know and we will talk about changing the medication.  If you are unable to pick up your Suboxone today please call the clinic and I will send in another refill.  For the bump on your left hand I think is likely due to a broken bone that was displaced and is healed with that bump being extra bone.  If you have any swelling, numbness, pain, or other symptoms with it please let us know but it is completely safe right now.  We will have you come back whenever works best for you to see Mason Jones to talk about treatment for your hepatitis C and we will see you again for a regular visit in about 3 months and we will start writing your Suboxone for 30-day supplies.   I have ordered the following medication/changed the following medications:    Start the following medications: Meds ordered this encounter  Medications   mirtazapine (REMERON) 30 MG tablet    Sig: Take 1 tablet (30 mg total) by mouth at bedtime.    Dispense:  30 tablet    Refill:  3      Follow up: Whenever you are able to see Mason Jones our infectious disease specialist.  We will see you back in 3 months for regular follow-up visit with resident.   Remember:     Should you have any questions or concerns please call the internal medicine clinic at [phone].     [name], DO [facility]

## 2023-05-27 NOTE — Assessment & Plan Note (Signed)
 Firm bump over the proximal dorsal third metacarpal carpal joint consistent with prior fracture and healed bone.  No current symptoms.

## 2023-05-27 NOTE — Assessment & Plan Note (Signed)
 Patient with history of IV drug use found to have positive hepatitis C with high viral load.  He has not been genotype yet. - Follow-up with Dr. Ninetta Lights for genotyping and treatment for hepatitis C

## 2023-05-29 NOTE — Progress Notes (Signed)
 Internal Medicine Clinic Attending  Case discussed with the resident at the time of the visit.  We reviewed the resident's history and exam and pertinent patient test results.  I agree with the assessment, diagnosis, and plan of care documented in the resident's note.

## 2023-06-09 ENCOUNTER — Ambulatory Visit: Payer: Self-pay | Admitting: Student

## 2023-06-09 ENCOUNTER — Encounter: Payer: Self-pay | Admitting: Licensed Clinical Social Worker

## 2023-06-09 DIAGNOSIS — F1129 Opioid dependence with unspecified opioid-induced disorder: Secondary | ICD-10-CM

## 2023-06-09 NOTE — Telephone Encounter (Signed)
  Chief Complaint: requesting refill on Buprenorphine HCL- Naloxone HCL 8-2 mg film. Will take last doses tomorrow. Symptoms: no report of sx . Frequency: today  Pertinent Negatives: Patient denies na Disposition: [] ED /[] Urgent Care (no appt availability in office) / [] Appointment(In office/virtual)/ []  [facility]/ [] Home Care/ [] Refused Recommended Disposition /[] [facility]/ [x]  Follow-up with PCP Additional Notes:   Last OV 05/27/23 . Patient concerned his next appt was scheduled for 08/24/23 and requesting refill of medication not to run out. Please advise.     Copied from CRM [id number]. Topic: Clinical - Red Word Triage >> Jun 09, 2023  4:19 PM Everette Rank wrote: Red Word that prompted transfer to Nurse Triage: Buprenorphine HCl-Naloxone HCl 8-2 MG FILM [[id number] due to subintrance abuse needs to take this medication and will be out by tomorrow. Reason for Disposition  Caller requesting a CONTROLLED substance prescription refill (e.g., narcotics, ADHD medicines)  Answer Assessment - Initial Assessment Questions 1. DRUG NAME: "What medicine do you need to have refilled?"     Buprenorphine HCL- Naloxone HCL 8-2 film 2. REFILLS REMAINING: "How many refills are remaining?" (Note: The label on the medicine or pill bottle will show how many refills are remaining. If there are no refills remaining, then a renewal may be needed.)     Na  3. EXPIRATION DATE: "What is the expiration date?" (Note: The label states when the prescription will expire, and thus can no longer be refilled.)     na 4. PRESCRIBING HCP: "Who prescribed it?" Reason: If prescribed by specialist, call should be referred to that group.     [facility]  5. SYMPTOMS: "Do you have any symptoms?"     Will run out of medication tomorrow.  6. PREGNANCY: "Is there any chance that you are pregnant?" "When was your last menstrual period?"     na  Protocols used: Medication Refill and Renewal Call-A-AH

## 2023-06-10 ENCOUNTER — Other Ambulatory Visit: Payer: Self-pay

## 2023-06-10 ENCOUNTER — Encounter: Payer: Self-pay | Admitting: Student

## 2023-06-10 ENCOUNTER — Other Ambulatory Visit: Payer: Self-pay | Admitting: Student

## 2023-06-10 ENCOUNTER — Ambulatory Visit: Payer: Self-pay | Admitting: Student

## 2023-06-10 ENCOUNTER — Emergency Department (HOSPITAL_COMMUNITY)
Admission: EM | Admit: 2023-06-10 | Discharge: 2023-06-10 | Discharge status: Against Medical Advice | Attending: Emergency Medicine | Admitting: Emergency Medicine

## 2023-06-10 ENCOUNTER — Encounter (HOSPITAL_COMMUNITY): Payer: Self-pay | Admitting: Emergency Medicine

## 2023-06-10 DIAGNOSIS — Z760 Encounter for issue of repeat prescription: Secondary | ICD-10-CM | POA: Diagnosis present

## 2023-06-10 DIAGNOSIS — F1129 Opioid dependence with unspecified opioid-induced disorder: Secondary | ICD-10-CM

## 2023-06-10 DIAGNOSIS — Z5329 Procedure and treatment not carried out because of patient's decision for other reasons: Secondary | ICD-10-CM | POA: Diagnosis not present

## 2023-06-10 NOTE — Telephone Encounter (Signed)
 Copied from CRM [id number]. Topic: Clinical - Medication Refill >> Jun 10, 2023  1:02 PM Hector Shade B wrote: Most Recent Primary Care Visit:  Provider: [name]  Department: [facility]  Visit Type: OPEN ESTABLISHED  Date: 05/27/2023  Medication: Buprenorphine HCl-Naloxone HCl 8-2 MG FILM  Has the patient contacted their pharmacy? Yes (Agent: If no, request that the patient contact the pharmacy for the refill. If patient does not wish to contact the pharmacy document the reason why and proceed with request.) (Agent: If yes, when and what did the pharmacy advise?) Patient stated the pharmacy advised him that prescription had not been called in  Is this the correct pharmacy for this prescription? Yes If no, delete pharmacy and type the correct one.  This is the patient's preferred pharmacy:  [facility] #[id number] Ginette Otto, [place] - [address] AT [facility] OF [facility] & [address] [place] [address] Phone: [phone] Fax: [phone]  [facility] #[id number] Ginette Otto, [place] - [address] AT [facility] OF [address] & [address] [place] [address] Phone: [phone] Fax: [phone]   Has the prescription been filled recently? Yes  Is the patient out of the medication? Yes  Has the patient been seen for an appointment in the last year OR does the patient have an upcoming appointment? Yes  Can we respond through MyChart? Yes  Agent: Please be advised that Rx refills may take up to 3 business days. We ask that you follow-up with your pharmacy.

## 2023-06-10 NOTE — ED Provider Notes (Signed)
 Patient left without being seen.  He did not see the patient   Charlynne Pander, MD 06/10/23 [phone]

## 2023-06-10 NOTE — Telephone Encounter (Signed)
  Chief Complaint: subutex refill Symptoms: out of medication Frequency: NA Pertinent Negatives: Patient denies current withdrawal symptoms Disposition: [] ED /[] Urgent Care (no appt availability in office) / [] Appointment(In office/virtual)/ []  [place] [facility]/ [] Home Care/ [x] Refused Recommended Disposition /[] [place] [facility]/ [x]  Follow-up with PCP Additional Notes:   Patient calling back to check status of medication refill. Explained 72 hour TAT for refill requests, patient states he is "seen every two weeks and this has never been a problem, if I don't get my meds today I will lose my job". Attempted to reach CAL line to explain situation and for recommendations over weekend for withdrawal symptoms but no answer on CAL line. Advised patient a message will be sent that he is out of medication but it likely will not be filled by end of day, to seek treatment at local walk in urgent care for withdrawal symptoms. Patient states he will not be calling back or going to an urgent care just wants his medication.  Reason for Disposition  Caller requesting a CONTROLLED substance prescription refill (e.g., narcotics, ADHD medicines)  Protocols used: Medication Refill and Renewal Call-A-AH

## 2023-06-10 NOTE — Telephone Encounter (Signed)
 Call dropped upon warm transfer. This RN made first attempt to contact pt with no answer. LVM with call back number.  Copied from CRM [id number]. Topic: Clinical - Prescription Issue >> Jun 10, 2023  3:41 PM Prudencio Pair wrote: Reason for CRM: Patient called stating he is currently at the pharmacy & trying to get his medication refilled. Advised pt that the refill request has been sent earlier today. Patient was frustrated that he is still without his medication and doesn't want to go into the weekend without it. Called CAL but they close early on Fridays. Called Triage Nurse & got patient over to Nurse Triage for assistance. Warm transferred the call.

## 2023-06-10 NOTE — Telephone Encounter (Signed)
 Patient called back to state that he went to urgent care and they were unable to help him. I advised him that if the office is closed that there would not be a provider to refill his medication. Patient stated he would contact a lawyer and hung up the phone.     Chief Complaint: subutex refill Symptoms: out of medication Frequency: NA Pertinent Negatives: Patient denies current withdrawal symptoms Disposition: [] ED /[] Urgent Care (no appt availability in office) / [] Appointment(In office/virtual)/ []  [place] [facility]/ [] Home Care/ [x] Refused Recommended Disposition /[] [place] [facility]/ [x]  Follow-up with PCP Additional Notes:   Patient calling back to check status of medication refill. Explained 72 hour TAT for refill requests, patient states he is "seen every two weeks and this has never been a problem, if I don't get my meds today I will lose my job". Attempted to reach CAL line to explain situation and for recommendations over weekend for withdrawal symptoms but no answer on CAL line. Advised patient a message will be sent that he is out of medication but it likely will not be filled by end of day, to seek treatment at local walk in urgent care for withdrawal symptoms. Patient states he will not be calling back or going to an urgent care just wants his medication.  Reason for Disposition  Caller requesting a CONTROLLED substance prescription refill (e.g., narcotics, ADHD medicines)  Protocols used: Medication Refill and Renewal Call-A-AH

## 2023-06-10 NOTE — ED Triage Notes (Signed)
 Patient presents due to a need for Suboxone refill. He says he tried to get it through his primary MD but has been unsuccessful.

## 2023-06-10 NOTE — ED Notes (Signed)
 Pt is upset about the wait and states he will be leaving

## 2023-06-11 ENCOUNTER — Telehealth: Payer: Self-pay | Admitting: Student

## 2023-06-11 DIAGNOSIS — F1129 Opioid dependence with unspecified opioid-induced disorder: Secondary | ICD-10-CM

## 2023-06-11 MED ORDER — BUPRENORPHINE HCL-NALOXONE HCL 8-2 MG SL FILM: 1.0000 | ORAL_FILM | Freq: Three times a day (TID) | SUBLINGUAL | 0 refills | Status: DC

## 2023-06-11 NOTE — Addendum Note (Signed)
 Addended by: Lucille Passy on: 06/11/2023 08:45 PM   Modules accepted: Orders

## 2023-06-11 NOTE — Addendum Note (Signed)
 Addended by: Marrianne Mood on: 06/11/2023 06:13 PM   Modules accepted: Orders

## 2023-06-11 NOTE — Addendum Note (Signed)
 Addended by: Marrianne Mood on: 06/11/2023 06:16 PM   Modules accepted: Orders

## 2023-06-11 NOTE — Telephone Encounter (Addendum)
 Fielded call from this patient for buprenorphine refill.  He is overdue and starting to feel sick with withdrawals.  Has been trying to get in touch with a member of our team and his pharmacy without success for the last couple of days.  He says that he is going to the clinic every couple of weeks for refills but has been doing well and is starting to space out his visits.  PDMP reviewed and refill tonight is appropriate, will go and refill for Suboxone 8-2 mg film to be used 3 times daily, 42 films for a 14-day supply.  Marrianne Mood MD 06/11/2023, 5:59 PM   Update 6:15 PM Initial pharmacy chosen closed at 1800. Can't get in touch with them to cancel script. Will leave voicemail with pharmacy to cancel original order. Sending buprenorphine to 24 hour pharmacy on [address]. Asked patient not to fill both scripts.  Update 8:35PM Patient called after hours line as [facility] on [address] did not have brand name of medication and his insurance would not cover for that reason.  He requests that [facility] [address]. I called and canceled RX to [facility] on [address].  Suboxone 8-2 mg film to be used 3 times daily, 42 films for a 14-day supply sent [facility] [address].  Katie M. Masters, D.O.  Internal Medicine Resident, PGY-3 [facility]  8:43 PM, 06/11/2023

## 2023-06-13 ENCOUNTER — Telehealth: Payer: Self-pay

## 2023-06-13 NOTE — Telephone Encounter (Signed)
 RTC from patient asking about return call.  RTC to patient was to inform him that his prescription had been taken care.  Patient was also informed to call at least 48 hours prior to his running out and if on a Friday do so on a Thursday so that he will not run out of medication and have to call the after hours line for a refill.

## 2023-06-13 NOTE — Telephone Encounter (Signed)
 Call to Pharmacy .  Patient picked up the prescription on 06/11/2023.

## 2023-06-13 NOTE — Transitions of Care (Post Inpatient/ED Visit) (Signed)
   06/13/2023  Name: Mason Jones MRN: [id number] DOB: 07-20-1985  Today's TOC FU Call Status: Today's TOC FU Call Status:: Successful TOC FU Call Completed TOC FU Call Complete Date: 06/13/23 Patient's Name and Date of Birth confirmed.  Transition Care Management Follow-up Telephone Call Date of Discharge: 06/10/23 Discharge Facility: [facility]) Type of Discharge: Emergency Department Reason for ED Visit: Other: (Rx refill) How have you been since you were released from the hospital?: Better Any questions or concerns?: No  Items Reviewed: Did you receive and understand the discharge instructions provided?: Yes Medications obtained,verified, and reconciled?: Yes (Medications Reviewed) Any new allergies since your discharge?: No Dietary orders reviewed?: Yes Do you have support at home?: No  Medications Reviewed Today: Medications Reviewed Today     Reviewed by Karena Addison, LPN (Licensed Practical Nurse) on 06/13/23 at 1534  Med List Status: <None>   Medication Order Taking? Sig Documenting Provider Last Dose Status Informant  Buprenorphine HCl-Naloxone HCl 8-2 MG FILM [id number]  Place 1 Film under the tongue 3 (three) times daily. Masters, Florentina Addison, DO  Active   mirtazapine (REMERON) 30 MG tablet [id number]  Take 1 tablet (30 mg total) by mouth at bedtime. [name], DO  Active   Med List Note (Card, Amy L, CPhT 12/13/22 1457): [facility]            Home Care and Equipment/Supplies: Were Home Health Services Ordered?: NA Any new equipment or medical supplies ordered?: NA  Functional Questionnaire: Do you need assistance with bathing/showering or dressing?: No Do you need assistance with meal preparation?: No Do you need assistance with eating?: No Do you have difficulty maintaining continence: No Do you need assistance with getting out of bed/getting out of a chair/moving?: No Do you have difficulty managing or taking your medications?:  No  Follow up appointments reviewed: PCP Follow-up appointment confirmed?: No (declined) MD Provider Line Number:[phone] Given: No Specialist Hospital Follow-up appointment confirmed?: NA Do you need transportation to your follow-up appointment?: No Do you understand care options if your condition(s) worsen?: Yes-patient verbalized understanding    SIGNATURE Karena Addison, LPN [facility] Nurse Health Advisor Direct Dial [phone]

## 2023-06-23 ENCOUNTER — Other Ambulatory Visit: Payer: Self-pay | Admitting: Internal Medicine

## 2023-06-23 DIAGNOSIS — F1129 Opioid dependence with unspecified opioid-induced disorder: Secondary | ICD-10-CM

## 2023-06-23 NOTE — Telephone Encounter (Signed)
 Copied from CRM [id number]. Topic: Clinical - Medication Refill >> Jun 23, 2023  8:58 AM Irine Seal wrote: Most Recent Primary Care Visit:  Provider: [name]  Department: [facility]  Visit Type: OPEN ESTABLISHED  Date: 05/27/2023  Medication: Buprenorphine HCl-Naloxone HCl 8-2 MG FILM  Has the patient contacted their pharmacy? No,   wants to change pharmacies due to difficulty finding it in stock. Insurance only covers the brand-name version.   Is this the correct pharmacy for this prescription? Yes If no, delete pharmacy and type the correct one.  This is the patient's preferred pharmacy:  [facility] #[id number] Ginette Otto, [place] - [address] AT [facility] OF [facility] & [address] [place] [address] Phone: [phone] Fax: [phone]    Has the prescription been filled recently? Yes  Is the patient out of the medication? Yes  Has the patient been seen for an appointment in the last year OR does the patient have an upcoming appointment? Yes  Can we respond through MyChart? Yes  Agent: Please be advised that Rx refills may take up to 3 business days. We ask that you follow-up with your pharmacy.

## 2023-06-24 ENCOUNTER — Telehealth: Payer: Self-pay | Admitting: Internal Medicine

## 2023-06-24 NOTE — Telephone Encounter (Signed)
 Patient called as PDMP showed recent history of oxycodone 5 mg 30 tablets refill from neurosurgery.   He receives Suboxone from clinic due to opioid use disorder.  Last prescription of Suboxone was filled on 3/15 for 14-day supply.  I was not able to reach patient this afternoon.  Voicemail left.  Clinic staff will need to verify why he was prescribed oxycodone and what we are doing moving forward with his Suboxone therapy.  He called after-hours line 3/15 as medication ran out.

## 2023-06-25 ENCOUNTER — Other Ambulatory Visit: Payer: Self-pay | Admitting: Student

## 2023-06-25 DIAGNOSIS — F1129 Opioid dependence with unspecified opioid-induced disorder: Secondary | ICD-10-CM

## 2023-06-25 MED ORDER — BUPRENORPHINE HCL-NALOXONE HCL 8-2 MG SL FILM: 1.0000 | ORAL_FILM | Freq: Three times a day (TID) | SUBLINGUAL | 0 refills | Status: AC

## 2023-06-25 NOTE — Telephone Encounter (Signed)
 Fielded a call from [place] today.  He is wondering about a refill of Suboxone.  He sees [place] in the clinic for opioid use disorder monitoring and management.  He says that he relapsed sometime last week and has been using pretty frequently since then.  He is frustrated about his relapse.  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  He wants to get back on methadone.  He was told by the methadone clinic director that if he is on Suboxone then they want initiate methadone therapy for him.  At any rate he feels like he will be at imminent risk for using and he is worried.  My plan is to send in a weeks worth of Suboxone tonight.  He will keep it at the pharmacy and avoid picking it up unless his withdrawal becomes so severe that he is at imminent risk of using, and plans to go to the methadone clinic first thing Monday morning.  Instructed him to avoid starting Suboxone until onset of withdrawal symptoms.  Sent more information to him via MyChart.  We will get a follow-up scheduled for him for this upcoming week or next to come up with a better lung plan for his use disorder.  His last tox assure was appropriate.  Of note his PDMP shows a prescription from [name] for 30 oxycodone, but he denies that he was ever prescribed this medicine.  Review of his chart shows no encounters with Dr. Franky Macho of neurosurgery.  I wonder if the information in the PDMP is incorrect.  Reviewed this plan with Dr. Antony Contras. - Buprenorphine-naloxone 8-2 mg sublingual films 3 times daily, sent to 24-hour pharmacy

## 2023-07-28 ENCOUNTER — Other Ambulatory Visit (HOSPITAL_COMMUNITY): Payer: Self-pay

## 2023-08-24 ENCOUNTER — Encounter: Payer: 59 | Admitting: Student

## 2023-08-26 ENCOUNTER — Encounter: Payer: Self-pay | Admitting: Student

## 2023-12-15 ENCOUNTER — Telehealth: Payer: Self-pay

## 2023-12-15 NOTE — Telephone Encounter (Signed)
 Patient last seen 05/27/23 I called the patient to schedule a fu appointment. Unable to reach the patient, I lvm for him to give us  a call back.
